# Patient Record
Sex: Female | Born: 1970
Health system: Southern US, Community
[De-identification: ages and names within clinical notes are randomized; demographics above are authoritative.]

## PROBLEM LIST (undated history)

## (undated) DIAGNOSIS — N920 Excessive and frequent menstruation with regular cycle: Secondary | ICD-10-CM

## (undated) DIAGNOSIS — M5430 Sciatica, unspecified side: Secondary | ICD-10-CM

## (undated) DIAGNOSIS — E119 Type 2 diabetes mellitus without complications: Secondary | ICD-10-CM

## (undated) DIAGNOSIS — I890 Lymphedema, not elsewhere classified: Secondary | ICD-10-CM

## (undated) DIAGNOSIS — I10 Essential (primary) hypertension: Secondary | ICD-10-CM

## (undated) DIAGNOSIS — I509 Heart failure, unspecified: Secondary | ICD-10-CM

## (undated) HISTORY — DX: Type 2 diabetes mellitus without complications: E11.9

## (undated) HISTORY — PX: OTHER SURGICAL HISTORY: SHX169

## (undated) HISTORY — PX: TUBAL LIGATION: SHX77

---

## 2018-10-12 ENCOUNTER — Emergency Department (HOSPITAL_COMMUNITY): Payer: Self-pay

## 2018-10-12 ENCOUNTER — Observation Stay (HOSPITAL_COMMUNITY)
Admission: EM | Admit: 2018-10-12 | Discharge: 2018-10-14 | Disposition: A | Discharge status: Home / Self Care | Payer: Self-pay | Attending: Internal Medicine | Admitting: Internal Medicine

## 2018-10-12 DIAGNOSIS — R0600 Dyspnea, unspecified: Secondary | ICD-10-CM | POA: Insufficient documentation

## 2018-10-12 DIAGNOSIS — D500 Iron deficiency anemia secondary to blood loss (chronic): Secondary | ICD-10-CM | POA: Insufficient documentation

## 2018-10-12 DIAGNOSIS — J811 Chronic pulmonary edema: Secondary | ICD-10-CM | POA: Insufficient documentation

## 2018-10-12 DIAGNOSIS — D509 Iron deficiency anemia, unspecified: Secondary | ICD-10-CM | POA: Diagnosis present

## 2018-10-12 DIAGNOSIS — I509 Heart failure, unspecified: Secondary | ICD-10-CM | POA: Insufficient documentation

## 2018-10-12 DIAGNOSIS — J45909 Unspecified asthma, uncomplicated: Secondary | ICD-10-CM | POA: Insufficient documentation

## 2018-10-12 DIAGNOSIS — I517 Cardiomegaly: Secondary | ICD-10-CM | POA: Insufficient documentation

## 2018-10-12 DIAGNOSIS — R0789 Other chest pain: Principal | ICD-10-CM | POA: Diagnosis present

## 2018-10-12 DIAGNOSIS — N920 Excessive and frequent menstruation with regular cycle: Secondary | ICD-10-CM | POA: Diagnosis present

## 2018-10-12 DIAGNOSIS — E872 Acidosis, unspecified: Secondary | ICD-10-CM | POA: Diagnosis present

## 2018-10-12 DIAGNOSIS — D649 Anemia, unspecified: Secondary | ICD-10-CM | POA: Diagnosis present

## 2018-10-12 HISTORY — DX: Excessive and frequent menstruation with regular cycle: N92.0

## 2018-10-12 HISTORY — DX: Morbid (severe) obesity due to excess calories: E66.01

## 2018-10-12 LAB — I-STAT BETA HCG BLOOD, ED (MC, WL, AP ONLY): I-stat hCG, quantitative: <5 m[IU]/mL (ref <5)

## 2018-10-12 LAB — I-STAT TROPONIN, ED: Troponin i, poc: 0 ng/mL (ref 0.00–0.08)

## 2018-10-12 LAB — D-DIMER, QUANTITATIVE: D-Dimer, Quant: 0.29 ug/mL-FEU (ref 0.00–0.50)

## 2018-10-12 MED ORDER — SODIUM CHLORIDE 0.9 % IV BOLUS (SEPSIS)
1000.0000 mL | Freq: Once | INTRAVENOUS | Status: AC
  Administered 2018-10-13: 1000 mL via INTRAVENOUS

## 2018-10-12 NOTE — ED Provider Notes (Signed)
TIME SEEN: 11:00 PM  CHIEF COMPLAINT: Right-sided chest pain  HPI: Patient is a 48 year old female with history of obesity, asthma, anemia who presents to the emergency department with right-sided chest pain under her right breast that started today.  States it is better when she is lying flat.  Given aspirin and nitroglycerin with EMS and now she is pain-free.  Describes it as a sharp pain.  Has felt short of breath but states this is chronic for the past 2 years.  Has had lower extremity swelling that is also chronic and unchanged.  Denies any fever.  Has had dry cough.  No vomiting or diarrhea.  No abdominal pain.  States she has had a history of "fluid on my heart".  ROS: See HPI Constitutional: no fever  Eyes: no drainage  ENT: no runny nose   Cardiovascular:   chest pain  Resp: Chronic SOB  GI: no vomiting GU: no dysuria Integumentary: no rash  Allergy: no hives  Musculoskeletal: no leg swelling  Neurological: no slurred speech ROS otherwise negative  PAST MEDICAL HISTORY/PAST SURGICAL HISTORY:  No past medical history on file.  MEDICATIONS:  Prior to Admission medications   Not on File    ALLERGIES:  Allergies not on file  SOCIAL HISTORY:  Social History   Tobacco Use  . Smoking status: Not on file  Substance Use Topics  . Alcohol use: Not on file    FAMILY HISTORY: No family history on file.  EXAM: BP (!) 106/44 (BP Location: Right Arm)   Pulse (!) 123   Temp 99.3 F (37.4 C) (Oral)   Resp 11   SpO2 100%  CONSTITUTIONAL: Alert and oriented and responds appropriately to questions. Well-appearing; well-nourished, obese HEAD: Normocephalic EYES: Conjunctivae clear, pupils appear equal, EOMI ENT: normal nose; moist mucous membranes NECK: Supple, no meningismus, no nuchal rigidity, no LAD  CARD: Regular and tachycardic; S1 and S2 appreciated; no murmurs, no clicks, no rubs, no gallops CHEST:  Chest wall is nontender to palpation.  No crepitus, ecchymosis,  erythema, warmth, rash or other lesions present.   RESP: Normal chest excursion without splinting or tachypnea; breath sounds clear and equal bilaterally; no wheezes, no rhonchi, no rales, no hypoxia or respiratory distress, speaking full sentences ABD/GI: Normal bowel sounds; non-distended; soft, non-tender, no rebound, no guarding, no peritoneal signs, no hepatosplenomegaly BACK:  The back appears normal and is non-tender to palpation, there is no CVA tenderness EXT: Normal ROM in all joints; non-tender to palpation; no edema; normal capillary refill; no cyanosis, no calf tenderness or swelling    SKIN: Normal color for age and race; warm; no rash NEURO: Moves all extremities equally PSYCH: The patient's mood and manner are appropriate. Grooming and personal hygiene are appropriate.  MEDICAL DECISION MAKING: Patient here with atypical right-sided chest pain.  Low suspicion for ACS currently.  Her EKG shows a sinus tachycardia.  She reports she has had a history of "fluid on her heart but is unable to tell me if this was a pleural effusion, pericardial effusion, pulmonary edema.  She denies any known history of CHF.  States she recently moved here from [facility] and all of her previous care was at [facility].  She has never had a pericardial window.  She has had a low-grade oral temperature here of 99.3.  Denies any fevers or chills, cough, sore throat, ear pain, body aches.  Did not have a flu shot this year.  Will check rectal temperature,  obtain blood cultures, lactate.  This could be pneumonia causing sepsis as she does have tachycardia and a systolic blood pressure in the 90s.  Will give IV fluids.  Also concern for possible pulmonary embolus.  She denies history of PE or DVT.  No calf tenderness or swelling on exam.  Will obtain d-dimer.  Symptomatic anemia could also be causing patient's symptoms today.  Has had history of previous transfusions.  Reports this is from  history of heavy menstrual cycles.  Her last menstrual period was January 10.  No vaginal bleeding currently, melena, bloody stools.  ED PROGRESS: Rectal temperature 99.  No leukocytosis.  Less likely sepsis.  Lactate mildly elevated.  She is receiving IV fluids.  Blood pressure and heart rate are improving.  Troponin negative.  D-dimer negative.  BNP normal.  Chest x-ray shows vascular congestion with pulmonary edema.  She has no hypoxia or change in her chronic shortness of breath.  Will hold on diuresis at this time given she has had some soft blood pressures.  Her hemoglobin today is 7.3.  This could be the cause of her symptoms is her symptomatic anemia.  We will transfuse 2 units of packed red blood cells.  Patient will need admission.  No local primary care provider.  1:57 AM Discussed patient's case with hospitalist, Dr. Clyde Lundborg.  I have recommended admission and patient (and family if present) agree with this plan. Admitting physician will place admission orders.   I reviewed all nursing notes, vitals, pertinent previous records, EKGs, lab and urine results, imaging (as available).     EKG Interpretation  Date/Time:  Friday October 12 2018 22:06:44 EST Ventricular Rate:  118 PR Interval:    QRS Duration: 82 QT Interval:  310 QTC Calculation: 435 R Axis:   38 Text Interpretation:  Sinus tachycardia Low voltage, precordial leads Nonspecific T abnormalities, lateral leads No old tracing to compare Confirmed by Ward, Baxter Hire [phone]) on 10/12/2018 11:00:32 PM        CRITICAL CARE Performed by: Baxter Hire Ward   Total critical care time: 65 minutes  Critical care time was exclusive of separately billable procedures and treating other patients.  Critical care was necessary to treat or prevent imminent or life-threatening deterioration.  Critical care was time spent personally by me on the following activities: development of treatment plan with patient and/or surrogate as well as  nursing, discussions with consultants, evaluation of patient's response to treatment, examination of patient, obtaining history from patient or surrogate, ordering and performing treatments and interventions, ordering and review of laboratory studies, ordering and review of radiographic studies, pulse oximetry and re-evaluation of patient's condition.     Ward, Layla Maw, DO 10/13/18 [phone]

## 2018-10-12 NOTE — ED Triage Notes (Signed)
Pt brought in by [facility] from home for sudden onset of nonradiating right sided chest pain at 2100, pt states pain worsens with movement. Pt states she has a hx of "fluid on the heart" in 2018. Pt given 324mg  aspirin and x2 nitro PTA, pain went from a 7/10 to 4/10.

## 2018-10-13 ENCOUNTER — Encounter (HOSPITAL_COMMUNITY): Payer: Self-pay | Admitting: Internal Medicine

## 2018-10-13 ENCOUNTER — Observation Stay (HOSPITAL_BASED_OUTPATIENT_CLINIC_OR_DEPARTMENT_OTHER): Payer: Self-pay

## 2018-10-13 ENCOUNTER — Other Ambulatory Visit: Payer: Self-pay

## 2018-10-13 DIAGNOSIS — R079 Chest pain, unspecified: Secondary | ICD-10-CM

## 2018-10-13 DIAGNOSIS — R0602 Shortness of breath: Secondary | ICD-10-CM

## 2018-10-13 DIAGNOSIS — R0789 Other chest pain: Secondary | ICD-10-CM

## 2018-10-13 DIAGNOSIS — D509 Iron deficiency anemia, unspecified: Secondary | ICD-10-CM | POA: Diagnosis present

## 2018-10-13 DIAGNOSIS — E872 Acidosis, unspecified: Secondary | ICD-10-CM | POA: Diagnosis present

## 2018-10-13 DIAGNOSIS — J810 Acute pulmonary edema: Secondary | ICD-10-CM

## 2018-10-13 DIAGNOSIS — R9389 Abnormal findings on diagnostic imaging of other specified body structures: Secondary | ICD-10-CM

## 2018-10-13 DIAGNOSIS — D649 Anemia, unspecified: Secondary | ICD-10-CM | POA: Diagnosis present

## 2018-10-13 DIAGNOSIS — N920 Excessive and frequent menstruation with regular cycle: Secondary | ICD-10-CM | POA: Diagnosis present

## 2018-10-13 LAB — RETICULOCYTES
Immature Retic Fract: 22.4 % — ABNORMAL HIGH (ref 2.3–15.9)
RBC.: 4.35 MIL/uL (ref 3.87–5.11)
Retic Count, Absolute: 73.1 10*3/uL (ref 19.0–186.0)
Retic Ct Pct: 1.7 % (ref 0.4–3.1)

## 2018-10-13 LAB — CBC WITH DIFFERENTIAL/PLATELET
Abs Immature Granulocytes: 0.02 10*3/uL (ref 0.00–0.07)
Basophils Absolute: 0.1 10*3/uL (ref 0.0–0.1)
Basophils Relative: 1 %
Eosinophils Absolute: 0.1 10*3/uL (ref 0.0–0.5)
Eosinophils Relative: 1 %
HCT: 30.3 % — ABNORMAL LOW (ref 36.0–46.0)
Hemoglobin: 7.3 g/dL — ABNORMAL LOW (ref 12.0–15.0)
Immature Granulocytes: 0 %
LYMPHS ABS: 2.3 10*3/uL (ref 0.7–4.0)
Lymphocytes Relative: 30 %
MCH: 16.7 pg — ABNORMAL LOW (ref 26.0–34.0)
MCHC: 24.1 g/dL — ABNORMAL LOW (ref 30.0–36.0)
MCV: 69.2 fL — AB (ref 80.0–100.0)
MONOS PCT: 6 %
Monocytes Absolute: 0.5 10*3/uL (ref 0.1–1.0)
Neutro Abs: 4.7 10*3/uL (ref 1.7–7.7)
Neutrophils Relative %: 62 %
Platelets: 338 10*3/uL (ref 150–400)
RBC: 4.38 MIL/uL (ref 3.87–5.11)
RDW: 19 % — ABNORMAL HIGH (ref 11.5–15.5)
WBC: 7.5 10*3/uL (ref 4.0–10.5)
nRBC: 0 % (ref 0.0–0.2)

## 2018-10-13 LAB — COMPREHENSIVE METABOLIC PANEL
ALT: 11 U/L (ref 0–44)
AST: 22 U/L (ref 15–41)
Albumin: 2.7 g/dL — ABNORMAL LOW (ref 3.5–5.0)
Alkaline Phosphatase: 82 U/L (ref 38–126)
Anion gap: 9 (ref 5–15)
BUN: 5 mg/dL — ABNORMAL LOW (ref 6–20)
CO2: 20 mmol/L — ABNORMAL LOW (ref 22–32)
CREATININE: 0.56 mg/dL (ref 0.44–1.00)
Calcium: 9.5 mg/dL (ref 8.9–10.3)
Chloride: 106 mmol/L (ref 98–111)
GFR calc Af Amer: >60 mL/min (ref >60)
GFR calc non Af Amer: >60 mL/min (ref >60)
Glucose, Bld: 139 mg/dL — ABNORMAL HIGH (ref 70–99)
Potassium: 3.8 mmol/L (ref 3.5–5.1)
Sodium: 135 mmol/L (ref 135–145)
Total Bilirubin: <0.1 mg/dL — ABNORMAL LOW (ref 0.3–1.2)
Total Protein: 8.4 g/dL — ABNORMAL HIGH (ref 6.5–8.1)

## 2018-10-13 LAB — IRON AND TIBC
Iron: 11 ug/dL — ABNORMAL LOW (ref 28–170)
Saturation Ratios: 2 % — ABNORMAL LOW (ref 10.4–31.8)
TIBC: 444 ug/dL (ref 250–450)
UIBC: 433 ug/dL

## 2018-10-13 LAB — URINALYSIS, ROUTINE W REFLEX MICROSCOPIC
Bilirubin Urine: NEGATIVE
Glucose, UA: NEGATIVE mg/dL
Hgb urine dipstick: NEGATIVE
Ketones, ur: NEGATIVE mg/dL
LEUKOCYTES UA: NEGATIVE
Nitrite: NEGATIVE
PROTEIN: NEGATIVE mg/dL
Specific Gravity, Urine: 1.021 (ref 1.005–1.030)
pH: 6 (ref 5.0–8.0)

## 2018-10-13 LAB — CBC
HCT: 31.3 % — ABNORMAL LOW (ref 36.0–46.0)
Hemoglobin: 8.1 g/dL — ABNORMAL LOW (ref 12.0–15.0)
MCH: 18.4 pg — ABNORMAL LOW (ref 26.0–34.0)
MCHC: 25.9 g/dL — ABNORMAL LOW (ref 30.0–36.0)
MCV: 71.1 fL — ABNORMAL LOW (ref 80.0–100.0)
Platelets: 299 10*3/uL (ref 150–400)
RBC: 4.4 MIL/uL (ref 3.87–5.11)
RDW: 21.1 % — ABNORMAL HIGH (ref 11.5–15.5)
WBC: 7.1 10*3/uL (ref 4.0–10.5)
nRBC: 0 % (ref 0.0–0.2)

## 2018-10-13 LAB — ECHOCARDIOGRAM COMPLETE: Height: 67 in

## 2018-10-13 LAB — FERRITIN: FERRITIN: 4 ng/mL — AB (ref 11–307)

## 2018-10-13 LAB — LIPID PANEL
Cholesterol: 133 mg/dL (ref 0–200)
HDL: 26 mg/dL — ABNORMAL LOW (ref >40)
LDL CALC: 88 mg/dL (ref 0–99)
Total CHOL/HDL Ratio: 5.1 RATIO
Triglycerides: 94 mg/dL (ref <150)
VLDL: 19 mg/dL (ref 0–40)

## 2018-10-13 LAB — TROPONIN I
Troponin I: <0.03 ng/mL (ref <0.03)
Troponin I: <0.03 ng/mL (ref <0.03)
Troponin I: <0.03 ng/mL (ref <0.03)

## 2018-10-13 LAB — RAPID URINE DRUG SCREEN, HOSP PERFORMED
Amphetamines: NOT DETECTED
BARBITURATES: NOT DETECTED
BENZODIAZEPINES: NOT DETECTED
Cocaine: NOT DETECTED
Opiates: NOT DETECTED
Tetrahydrocannabinol: NOT DETECTED

## 2018-10-13 LAB — VITAMIN B12: Vitamin B-12: 276 pg/mL (ref 180–914)

## 2018-10-13 LAB — LACTIC ACID, PLASMA
Lactic Acid, Venous: 2.6 mmol/L (ref 0.5–1.9)
Lactic Acid, Venous: 1.1 mmol/L (ref 0.5–1.9)
Lactic Acid, Venous: 1.7 mmol/L (ref 0.5–1.9)

## 2018-10-13 LAB — APTT: aPTT: 29 seconds (ref 24–36)

## 2018-10-13 LAB — PROCALCITONIN: Procalcitonin: 0.1 ng/mL

## 2018-10-13 LAB — ABO/RH: ABO/RH(D): O POS

## 2018-10-13 LAB — HEMOGLOBIN A1C
HEMOGLOBIN A1C: 5.5 % (ref 4.8–5.6)
Mean Plasma Glucose: 111.15 mg/dL

## 2018-10-13 LAB — HIV ANTIBODY (ROUTINE TESTING W REFLEX): HIV Screen 4th Generation wRfx: NONREACTIVE

## 2018-10-13 LAB — PROTIME-INR
INR: 1.06
Prothrombin Time: 13.7 seconds (ref 11.4–15.2)

## 2018-10-13 LAB — PREPARE RBC (CROSSMATCH)

## 2018-10-13 LAB — FOLATE: Folate: 10.2 ng/mL (ref >5.9)

## 2018-10-13 LAB — BRAIN NATRIURETIC PEPTIDE: B Natriuretic Peptide: 33.6 pg/mL (ref 0.0–100.0)

## 2018-10-13 LAB — OCCULT BLOOD X 1 CARD TO LAB, STOOL: Fecal Occult Bld: NEGATIVE

## 2018-10-13 MED ORDER — NITROGLYCERIN 0.4 MG SL SUBL: 0.4000 mg | SUBLINGUAL_TABLET | SUBLINGUAL | Status: DC | PRN

## 2018-10-13 MED ORDER — FERROUS SULFATE 325 (65 FE) MG PO TABS
325.0000 mg | ORAL_TABLET | Freq: Two times a day (BID) | ORAL | Status: DC
  Administered 2018-10-13 – 2018-10-14 (×3): 325 mg via ORAL
  Filled 2018-10-13 (×3): qty 1

## 2018-10-13 MED ORDER — ACETAMINOPHEN 325 MG PO TABS
650.0000 mg | ORAL_TABLET | ORAL | Status: DC | PRN
  Administered 2018-10-13: 650 mg via ORAL
  Filled 2018-10-13: qty 2

## 2018-10-13 MED ORDER — ASPIRIN EC 325 MG PO TBEC
325.0000 mg | DELAYED_RELEASE_TABLET | Freq: Every day | ORAL | Status: DC
  Administered 2018-10-13 – 2018-10-14 (×2): 325 mg via ORAL
  Filled 2018-10-13 (×2): qty 1

## 2018-10-13 MED ORDER — ONDANSETRON HCL 4 MG/2ML IJ SOLN: 4.0000 mg | Freq: Four times a day (QID) | INTRAMUSCULAR | Status: DC | PRN

## 2018-10-13 MED ORDER — FUROSEMIDE 10 MG/ML IJ SOLN
40.0000 mg | Freq: Once | INTRAMUSCULAR | Status: AC
  Administered 2018-10-13: 40 mg via INTRAVENOUS
  Filled 2018-10-13: qty 4

## 2018-10-13 MED ORDER — ENOXAPARIN SODIUM 40 MG/0.4ML ~~LOC~~ SOLN
40.0000 mg | Freq: Every day | SUBCUTANEOUS | Status: DC
  Filled 2018-10-13: qty 0.4

## 2018-10-13 MED ORDER — MORPHINE SULFATE (PF) 2 MG/ML IV SOLN: 2.0000 mg | INTRAVENOUS | Status: DC | PRN

## 2018-10-13 MED ORDER — SODIUM CHLORIDE 0.9 % IV SOLN
10.0000 mL/h | Freq: Once | INTRAVENOUS | Status: AC
  Administered 2018-10-13: 10 mL/h via INTRAVENOUS

## 2018-10-13 NOTE — Progress Notes (Signed)
Called echo, technician stated pt is next for transport for 2D echo  Pt aware

## 2018-10-13 NOTE — Progress Notes (Signed)
Lab aware of needing h/h at 1100, 2hour post infusion per protocol

## 2018-10-13 NOTE — Progress Notes (Addendum)
Patient was admitted after midnight and agree with overall assessment and plan.  She reports having right-sided chest pains with shortness of breath on exertion for which she is unable to ambulate.  Previously reports being on a medication to help with heavy menstrual periods but she has not been on this medication in over 2 years.  Patient was given 2 units of blood, but hemoglobin noted to only go from 7.3 --> 8.1.  Lactic acidosis resolved without antibiotics and patient remains afebrile.  Iron levels low which have started patient on ferrous sulfate.  Cardiology evaluated patient and thought chest pain symptoms atypical in nature, troponins were negative, and did not recommend any further cardiac work-up.  Chest x-ray showing mild congestive heart failure with prominence of the pulmonary artery, BNP 33.6, and d-dimer was negative.  She does have at least 1+ pitting lower extremity edema bilaterally.  Give Lasix 40 mg IV for suspected CHF related with anemia.  Physical therapy to eval and treat for debility.  See full H&P for complete details.

## 2018-10-13 NOTE — Progress Notes (Signed)
Called lab regarding troponin draw  Stated they will come to bedside and draw now

## 2018-10-13 NOTE — H&P (Signed)
History and Physical    Kaylee [name] ZOX:[id number] DOB: 26-Mar-1971 DOA: 10/12/2018  Referring MD/NP/PA:   PCP: Patient, No Pcp Per   Patient coming from:  The patient is coming from home.  At baseline, pt is independent for most of ADL.        Chief Complaint: chest pain  HPI: Kaylee [name] is a 48 y.o. female with medical history significant of morbid obesity, heavy menstrual, possible cardiomyopathy (patient states that she has enlarged heart), who presents with chest pain.  Patient states that her chest pain started suddenly at about 9 PM.  It is located in the substernal area, 7 out of 10 in severity initially, currently 4 out of 10 severity, dull, radiating to the right arm, aggravated by exertion.  Patient also has shortness breath and dry cough.  No recent long traveling.  No fever or chills.  She does not have nausea, vomiting, diarrhea, abdominal pain, symptoms of UTI.  Denies any rectal bleeding or dark stool.  No hematuria. She states she has regular, but heavy menstrual.,  The last period was January 10 currently not..  ED Course: pt was found to have negative troponin, BNP 36, WBC 7.5, hemoglobin 7.3 (no baseline hemoglobin), lactic acid 2.6, negative d-dimer, negative pregnancy test, electrolytes renal function okay, temperature 99.3, tachycardia, oxygen saturation 100% on room air.  Chest x-ray showed CHF pattern with possible pulmonary hypertension.  Patient is placed on telemetry bed for observation.  Review of Systems:   General: no fevers, chills, no body weight gain, has fatigue HEENT: no blurry vision, hearing changes or sore throat Respiratory: has dyspnea, coughing, no wheezing CV: no chest pain, no palpitations GI: no nausea, vomiting, abdominal pain, diarrhea, constipation GU: no dysuria, burning on urination, increased urinary frequency, hematuria  Ext: no leg edema Neuro: no unilateral weakness, numbness, or tingling, no vision change or hearing loss Skin: no  rash, no skin tear. MSK: No muscle spasm, no deformity, no limitation of range of movement in spin Heme: No easy bruising.  Travel history: No recent long distant travel.  Allergy: No Known Allergies  Past Medical History:  Diagnosis Date  . Heavy menstrual period   . Morbid obesity (HCC)     Past Surgical History:  Procedure Laterality Date  . s/p of leep cervix      Social History:  reports that she has never smoked. She has never used smokeless tobacco. No history on file for alcohol and drug.  Family History:  Family History  Problem Relation Age of Onset  . Leukemia Mother      Prior to Admission medications   Not on File    Physical Exam: Vitals:   10/13/18 0305 10/13/18 0331 10/13/18 0600 10/13/18 0626  BP: 107/66 110/68 119/77 116/73  Pulse: (!) 101 78 89 92  Resp: 13 13 14 13   Temp: 98.7 F (37.1 C) 98.9 F (37.2 C) 98.8 F (37.1 C) 98.7 F (37.1 C)  TempSrc: Oral Oral Oral Oral  SpO2: 98% 97% 98% 99%  Height: 5\' 7"  (1.702 m)      General: Not in acute distress. Pale looking.  Morbid obesity HEENT:       Eyes: PERRL, EOMI, no scleral icterus.       ENT: No discharge from the ears and nose, no pharynx injection, no tonsillar enlargement.        Neck: No JVD, no bruit, no mass felt. Heme: No neck lymph node enlargement. Cardiac: S1/S2, RRR, No murmurs, No  gallops or rubs. Respiratory:  No rales, wheezing, rhonchi or rubs. GI: Soft, nondistended, nontender, no rebound pain, no organomegaly, BS present. GU: No hematuria Ext: No pitting leg edema bilaterally. 2+DP/PT pulse bilaterally. Musculoskeletal: No joint deformities, No joint redness or warmth, no limitation of ROM in spin. Skin: No rashes.  Neuro: Alert, oriented X3, cranial nerves II-XII grossly intact, moves all extremities normally.  Psych: Patient is not psychotic, no suicidal or hemocidal ideation.  Labs on Admission: I have personally reviewed following labs and imaging  studies  CBC: Recent Labs  Lab 10/12/18 2320  WBC 7.5  NEUTROABS 4.7  HGB 7.3*  HCT 30.3*  MCV 69.2*  PLT 338   Basic Metabolic Panel: Recent Labs  Lab 10/12/18 2320  NA 135  K 3.8  CL 106  CO2 20*  GLUCOSE 139*  BUN 5*  CREATININE 0.56  CALCIUM 9.5   GFR: CrCl cannot be calculated (Unknown ideal weight.). Liver Function Tests: Recent Labs  Lab 10/12/18 2320  AST 22  ALT 11  ALKPHOS 82  BILITOT <0.1*  PROT 8.4*  ALBUMIN 2.7*   No results for input(s): LIPASE, AMYLASE in the last 168 hours. No results for input(s): AMMONIA in the last 168 hours. Coagulation Profile: Recent Labs  Lab 10/13/18 0311  INR 1.06   Cardiac Enzymes: Recent Labs  Lab 10/13/18 0311  TROPONINI <0.03   BNP (last 3 results) No results for input(s): PROBNP in the last 8760 hours. HbA1C: No results for input(s): HGBA1C in the last 72 hours. CBG: No results for input(s): GLUCAP in the last 168 hours. Lipid Profile: Recent Labs    10/13/18 0311  CHOL 133  HDL 26*  LDLCALC 88  TRIG 94  CHOLHDL 5.1   Thyroid Function Tests: No results for input(s): TSH, T4TOTAL, FREET4, T3FREE, THYROIDAB in the last 72 hours. Anemia Panel: Recent Labs    10/13/18 0118  VITAMINB12 276  FOLATE 10.2  FERRITIN 4*  TIBC 444  IRON 11*  RETICCTPCT 1.7   Urine analysis: No results found for: COLORURINE, APPEARANCEUR, LABSPEC, PHURINE, GLUCOSEU, HGBUR, BILIRUBINUR, KETONESUR, PROTEINUR, UROBILINOGEN, NITRITE, LEUKOCYTESUR Sepsis Labs: @LABRCNTIP (procalcitonin:4,lacticidven:4) )No results found for this or any previous visit (from the past 240 hour(s)).   Radiological Exams on Admission: Dg Chest 2 View  Result Date: 10/13/2018 CLINICAL DATA:  Right chest pain, fever EXAM: CHEST - 2 VIEW COMPARISON:  None. FINDINGS: Mild cardiomegaly. Prominent main pulmonary artery contour. Otherwise normal mediastinal contour. No pneumothorax. No pleural effusion. Mild pulmonary edema. No acute  consolidative airspace disease. IMPRESSION: Mild congestive heart failure. Prominence of the main pulmonary artery contour suggests pulmonary arterial hypertension. Electronically Signed   By: Delbert PhenixJason A Poff M.D.   On: 10/13/2018 00:02     EKG: Independently reviewed.  Sinus rhythm with tachycardia, QTC 435, low voltage, nonspecific T wave change.  Assessment/Plan Principal Problem:   Chest pain Active Problems:   Microcytic anemia   Symptomatic anemia   Heavy menstrual period   Lactic acidosis   Chest pain: Likely due to demand ischemia secondary to symptomatic anemia.  Initial troponin negative.  D-dimer negative. - Placed on telemetry bed for observation -Transfuse 2 unit of blood - cycle CE q6 x3 and repeat EKG in the am  - prn Nitroglycerin, Morphine, and aspirin - Risk factor stratification: will check FLP and A1C , UDS - 2d echo - check BNP   Microcytic anemia and symptomatic anemia: Likely due to heavy menstrual.. -2 U of blood were ordered by EDP. -  check FOBT and anemia panel.  Heavy menstrual period: -f/u with Ob/gyn  Lactic acidosis: Lactic acid 2.6.  No fever or leukocytosis.  Clinically does not seem to have sepsis.  Possibly due to dehydration and anemia. -1 L normal saline was given -Patient will be transfused with 2 unit of blood. -repeat lactic acid in AM - Bx     DVT ppx: SQ Lovenox Code Status: Full code Family Communication:  Daughter is at bedside Disposition Plan:  Anticipate discharge back to previous home environment Consults called:  none Admission status: Obs / tele    Date of Service 10/13/2018    Lorretta Harp [facility]   If 7PM-7AM, please contact night-coverage [email or web address] Password [facility] 10/13/2018, 7:17 AM

## 2018-10-13 NOTE — Progress Notes (Signed)
  Echocardiogram 2D Echocardiogram has been performed.  Kaylee [name] 10/13/2018, 3:48 PM

## 2018-10-13 NOTE — ED Notes (Addendum)
RN informed Lactic acid is 2.6. RN informed MD.

## 2018-10-13 NOTE — Progress Notes (Signed)
Educated pt on needing a stool sample  Pt aware Placed collection hat in toilet  Will continue to monitor

## 2018-10-13 NOTE — Consult Note (Addendum)
Cardiology Consultation:   Patient ID: Kaylee [name] MRN: [id number]; DOB: 22-Jul-1971  Admit date: 10/12/2018 Date of Consult: 10/13/2018  Primary Care Provider: Patient, No Pcp Per Primary Cardiologist: New to [facility] Primary Electrophysiologist:  None    Patient Profile:   Kaylee [name] is a 48 y.o. African-American female with poor access to healthcare, hx of morbid obesity, asthma, menorrhagia, anemia and self-reported history of cervical cancer and cardiomegaly who is being seen today for the evaluation of chest pain at the request of Dr. Marcelyn Bruins, internal medicine.  History of Present Illness:    Kaylee [name] is a 48 y.o. African-American female with poor access to healthcare, hx of morbid obesity, asthma, menorrhagia, anemia and self-reported history of cervical cancer and cardiomegaly who is being seen today for the evaluation of chest pain at the request of Dr. Marcelyn Bruins, internal medicine.   Patient reports that she recently moved to the [place] area from [facility] 1 year ago.  She has been without insurance thus has had poor access to healthcare.  She reports a prior history of anemia secondary to menorrhagia.  2 years ago she was treated at a hospital in [facility] for anemia with hemoglobin level down into the 3 range and required blood transfusion.  She reports that she was being followed by a gynecologist around that time and was told that she had cervical cancer.  It was recommended that she have a hysterectomy however the patient reports that she was not able to get this done due to lack of insurance.  She was also previously on iron supplements for her anemia but has not been compliant.  She has not seen a gynecologist in over 2 years.  She also reports that when she was hospitalized 2 years ago she was told that she had an enlarged heart however did not undergo any extensive cardiac work-up.  She denies any significant cardiac risk  factors other than obesity.  There is no known history of diabetes, hyperlipidemia nor hypertension.  She denies history of tobacco use.  No family history of cardiac disease.  Family history is notable for leukemia in mother.  She reports that over the last year she has been troubled by exertional dyspnea.  This has worsened over the last several weeks.  She is short of breath ambulating around her house and has to stop and rest.  She notes decreased exercise tolerance.  Yesterday she developed new onset chest discomfort.  She was sitting on the side of her bed talking to her grandchildren.  She reports the pain came out of nowhere.  It was described as sharp pain underneath her right breast radiating to her right shoulder.  For she felt that it was indigestion however it was not relieved with belching.  It was also worsened with physical movement.  The pain was intense, 7 out of 10.  She called EMS.  On their arrival they gave her sublingual nitroglycerin which resulted in symptomatic improvement, bringing the pain down from a 7 out of 10 to a 4 out of 10.  She received a second sublingual nitroglycerin which helped to completely resolve the pain.  She was brought into the ED for evaluation.  Initial troponin negative.  Chest x-ray showed mild congestive heart failure and prominence of the main pulmonary artery contour suggestive of pulmonary arterial hypertension. BNP WNL at 33.6 (may be falsely low due to obesity). D-dimer negative. EKG shows NSR. HR 92 bpm. No ischemic abnormalities. Lipid  panel shows LDL at 88 mg/dL. HDL low at 26. TG WNL at 94. Hgb A1c pending. CBC shows anemia w/ Hgb at 7.3. No baseline for comparison. MCV 69 c/w microcytic anemia. Anemia panel c/w IDA. Low iron and low Feratin. B12 WNL. FOBT pending.  She is currently getting a blood transfusion.  She is currently chest pain-free.  Past Medical History:  Diagnosis Date  . Heavy menstrual period   . Morbid obesity (HCC)     Past  Surgical History:  Procedure Laterality Date  . s/p of leep cervix         Inpatient Medications: Scheduled Meds: . aspirin EC  325 mg Oral Daily  . enoxaparin (LOVENOX) injection  40 mg Subcutaneous QHS   Continuous Infusions:  PRN Meds: acetaminophen, morphine injection, nitroGLYCERIN, ondansetron (ZOFRAN) IV  Allergies:   No Known Allergies  Social History:   Social History   Socioeconomic History  . Marital status: Married    Spouse name: Not on file  . Number of children: Not on file  . Years of education: Not on file  . Highest education level: Not on file  Occupational History  . Not on file  Social Needs  . Financial resource strain: Not on file  . Food insecurity:    Worry: Not on file    Inability: Not on file  . Transportation needs:    Medical: Not on file    Non-medical: Not on file  Tobacco Use  . Smoking status: Never Smoker  . Smokeless tobacco: Never Used  Substance and Sexual Activity  . Alcohol use: Not on file  . Drug use: Not on file  . Sexual activity: Not on file  Lifestyle  . Physical activity:    Days per week: Not on file    Minutes per session: Not on file  . Stress: Not on file  Relationships  . Social connections:    Talks on phone: Not on file    Gets together: Not on file    Attends religious service: Not on file    Active member of club or organization: Not on file    Attends meetings of clubs or organizations: Not on file    Relationship status: Not on file  . Intimate partner violence:    Fear of current or ex partner: Not on file    Emotionally abused: Not on file    Physically abused: Not on file    Forced sexual activity: Not on file  Other Topics Concern  . Not on file  Social History Narrative  . Not on file    Family History:   Family History  Problem Relation Age of Onset  . Leukemia Mother      ROS:  Please see the history of present illness.   All other ROS reviewed and negative.     Physical  Exam/Data:   Vitals:   10/13/18 0305 10/13/18 0331 10/13/18 0600 10/13/18 0626  BP: 107/66 110/68 119/77 116/73  Pulse: (!) 101 78 89 92  Resp: 13 13 14 13   Temp: 98.7 F (37.1 C) 98.9 F (37.2 C) 98.8 F (37.1 C) 98.7 F (37.1 C)  TempSrc: Oral Oral Oral Oral  SpO2: 98% 97% 98% 99%  Height: 5\' 7"  (1.702 m)       Intake/Output Summary (Last 24 hours) at 10/13/2018 0734 Last data filed at 10/13/2018 16100611 Gross per 24 hour  Intake 1341 ml  Output -  Net 1341 ml   No flowsheet  data found.   There is no height or weight on file to calculate BMI.  General: Super morbidly obese African-American female in no acute distress HEENT: normal Lymph: no adenopathy Neck: no JVD Endocrine:  No thryomegaly Vascular: No carotid bruits; FA pulses 2+ bilaterally without bruits  Cardiac:  normal S1, S2; RRR; no murmur  Lungs:  clear to auscultation bilaterally, no wheezing, rhonchi or rales  Abd: Obese soft, nontender, no hepatomegaly  Ext: Obese extremities, no pitting edema Musculoskeletal:  No deformities, BUE and BLE strength normal and equal Skin: warm and dry  Neuro:  CNs 2-12 intact, no focal abnormalities noted Psych:  Normal affect   EKG:  The EKG was personally reviewed and demonstrates:  NSR 92 bpm Telemetry:  Telemetry was personally reviewed and demonstrates: Normal sinus rhythm, mild sinus tachycardia 1 teens.  Relevant CV Studies: 2D Echo pending    Laboratory Data:  Chemistry Recent Labs  Lab 10/12/18 2320  NA 135  K 3.8  CL 106  CO2 20*  GLUCOSE 139*  BUN 5*  CREATININE 0.56  CALCIUM 9.5  GFRNONAA >60  GFRAA >60  ANIONGAP 9    Recent Labs  Lab 10/12/18 2320  PROT 8.4*  ALBUMIN 2.7*  AST 22  ALT 11  ALKPHOS 82  BILITOT <0.1*   Hematology Recent Labs  Lab 10/12/18 2320 10/13/18 0118  WBC 7.5  --   RBC 4.38 4.35  HGB 7.3*  --   HCT 30.3*  --   MCV 69.2*  --   MCH 16.7*  --   MCHC 24.1*  --   RDW 19.0*  --   PLT 338  --    Cardiac  Enzymes Recent Labs  Lab 10/13/18 0311  TROPONINI <0.03    Recent Labs  Lab 10/12/18 2329  TROPIPOC 0.00    BNP Recent Labs  Lab 10/12/18 2320  BNP 33.6    DDimer  Recent Labs  Lab 10/12/18 2320  DDIMER 0.29    Radiology/Studies:  Dg Chest 2 View  Result Date: 10/13/2018 CLINICAL DATA:  Right chest pain, fever EXAM: CHEST - 2 VIEW COMPARISON:  None. FINDINGS: Mild cardiomegaly. Prominent main pulmonary artery contour. Otherwise normal mediastinal contour. No pneumothorax. No pleural effusion. Mild pulmonary edema. No acute consolidative airspace disease. IMPRESSION: Mild congestive heart failure. Prominence of the main pulmonary artery contour suggests pulmonary arterial hypertension. Electronically Signed   By: Delbert Phenix M.D.   On: 10/13/2018 00:02    Assessment and Plan:   Aiysha Freiwald is a 48 y.o. African-American female with poor access to healthcare, hx of morbid obesity, asthma, menorrhagia, anemia and self-reported history of cervical cancer and cardiomegaly who is being seen today for the evaluation of chest pain at the request of Dr. Marcelyn Bruins, internal medicine.  1.  Chest pain: Chest pain atypical and likely secondary to symptomatic anemia with hemoglobin at 7.3.  Initial troponin is negative and chest x-ray shows no ischemic abnormalities.  Agree with blood transfusion.  Given her anemia and ongoing heavy vaginal bleeding, she is not a candidate for invasive cardiac catheterization thus would not pursue further ischemic work-up at this time.  We can follow-up with her in clinic and can reassess for cardiac symptoms once her anemia and gynecologic issues are treated  2.  Abnormal chest x-ray: Patient reports being told in the past that she had an enlarged heart.  Chest x-ray in the ED showed mild congestive heart failure and prominence of the main pulmonary artery  contour suggestive of pulmonary arterial hypertension.  Her BNP is within normal limits at 33 however  this may be falsely low due to her obesity.  It is difficult to assess volume given her body habitus/morbid obesity.  She does endorse lower extremity edema however her albumin level is low at 2.7, complicating clinical picture.  Her exertional dyspnea is likely secondary to her anemia.  If she continues to have exertional dyspnea post blood transfusion may consider dose of IV Lasix.  Echocardiogram has been ordered and is pending to assess overall cardiac function.   3.  Iron deficiency anemia: Secondary to menorrhagia.  Hemoglobin in the ED was 7.3.  No prior labs for baseline comparison. MCV 69 c/w microcytic anemia. Anemia panel c/w IDA. Low iron and low Feratin.  She is currently getting a blood transfusion.  Further management per internal medicine.  4.  Menorrhagia: Patient reports long history of this.  She recalls that 2 years ago she was told that she has possible cervical cancer and hysterectomy was recommended however the patient did not go through with this due to lack of insurance.  She has not seen a gynecologist in over 2 years.  She will need assistance with getting plugged in with a new provider here in [place].  [facility]  Will defer to primary team.    For questions or updates, please contact [facility] Please consult [email or web address] for contact info under     Signed, Robbie Lis, PA-C  10/13/2018 7:34 AM  The patient was seen and examined, and I agree with the history, physical exam, assessment and plan as documented above, with modifications as noted below. I have also personally reviewed all relevant documentation, old records, labs, and both radiographic and cardiovascular studies. I have also independently interpreted old and new ECG's.  Briefly, this is a 48 year old woman with morbid obesity, asthma, menorrhagia, and anemia whom we are asked to evaluate for chest pain.    She was markedly anemic with a hemoglobin  of 7.3 at the time of admission.  Labs reviewed and appears to be consistent with an iron deficiency anemia.    She has since been transfused and symptoms have essentially resolved.  She has some mild chest wall tenderness to palpation and does describe some dull aches in the center of her chest but these are mild compared to what she first experienced.  She also had shortness of breath which is also significantly improved since receiving a blood transfusion.    ECG showed sinus rhythm and is without ischemic abnormalities.    Troponins are normal.  BNP is also normal although there is can be falsely low due to morbid obesity.    An echocardiogram has been ordered and is pending.  No additional cardiac studies will be pursued given her transfusion dependent iron deficiency anemia.  We will arrange for outpatient follow-up.   Prentice Docker, MD, [facility]  10/13/2018 10:27 AM  [facility] will sign off.   Medication Recommendations:  None Other recommendations (labs, testing, etc):  None Follow up as an outpatient:  Will arrange for cardiology outpatient follow up.

## 2018-10-14 LAB — TYPE AND SCREEN
ABO/RH(D): O POS
Antibody Screen: NEGATIVE
Unit division: 0
Unit division: 0

## 2018-10-14 LAB — CBC WITH DIFFERENTIAL/PLATELET
Abs Immature Granulocytes: 0 10*3/uL (ref 0.00–0.07)
Basophils Absolute: 0.2 10*3/uL — ABNORMAL HIGH (ref 0.0–0.1)
Basophils Relative: 2 %
Eosinophils Absolute: 0.2 10*3/uL (ref 0.0–0.5)
Eosinophils Relative: 3 %
HCT: 33.1 % — ABNORMAL LOW (ref 36.0–46.0)
Hemoglobin: 8.6 g/dL — ABNORMAL LOW (ref 12.0–15.0)
Lymphocytes Relative: 30 %
Lymphs Abs: 2.3 10*3/uL (ref 0.7–4.0)
MCH: 18.5 pg — ABNORMAL LOW (ref 26.0–34.0)
MCHC: 26 g/dL — ABNORMAL LOW (ref 30.0–36.0)
MCV: 71 fL — ABNORMAL LOW (ref 80.0–100.0)
MONOS PCT: 7 %
Monocytes Absolute: 0.5 10*3/uL (ref 0.1–1.0)
Neutro Abs: 4.4 10*3/uL (ref 1.7–7.7)
Neutrophils Relative %: 58 %
Platelets: 293 10*3/uL (ref 150–400)
RBC: 4.66 MIL/uL (ref 3.87–5.11)
RDW: 21.5 % — AB (ref 11.5–15.5)
WBC: 7.5 10*3/uL (ref 4.0–10.5)
nRBC: 0 /100 WBC
nRBC: 0.3 % — ABNORMAL HIGH (ref 0.0–0.2)

## 2018-10-14 LAB — BPAM RBC
BLOOD PRODUCT EXPIRATION DATE: 202003092359
Blood Product Expiration Date: 202003092359
ISSUE DATE / TIME: 202002080600
ISSUE DATE / TIME: 202002080252
Unit Type and Rh: 5100
Unit Type and Rh: 5100

## 2018-10-14 MED ORDER — HYDROCHLOROTHIAZIDE 12.5 MG PO TABS: 12.5000 mg | ORAL_TABLET | Freq: Every day | ORAL | 0 refills | Status: DC

## 2018-10-14 MED ORDER — POTASSIUM CHLORIDE ER 10 MEQ PO TBCR: 10.0000 meq | EXTENDED_RELEASE_TABLET | Freq: Every day | ORAL | 0 refills | Status: DC

## 2018-10-14 MED ORDER — FERROUS SULFATE 325 (65 FE) MG PO TABS: 325.0000 mg | ORAL_TABLET | Freq: Two times a day (BID) | ORAL | 0 refills | Status: DC

## 2018-10-14 MED ORDER — DICLOFENAC SODIUM 1 % TD GEL
2.0000 g | Freq: Four times a day (QID) | TRANSDERMAL | Status: DC
  Filled 2018-10-14: qty 100

## 2018-10-14 MED ORDER — ALBUTEROL SULFATE HFA 108 (90 BASE) MCG/ACT IN AERS: 2.0000 | INHALATION_SPRAY | Freq: Four times a day (QID) | RESPIRATORY_TRACT | 0 refills | Status: DC | PRN

## 2018-10-14 NOTE — Progress Notes (Signed)
Discharge paper work completed at bedside with pt and pt daughter. Aditional education provided on heart healthy diet, sodium and fluid restriction, weighing self daily and monitoring for swelling. Pt and pt daughter verbalize understanding. Pt IV's discounted, catheters intact and temetry removed. Pt has all belongings including taxi pass. Pt ambulated off unit with staff.

## 2018-10-14 NOTE — Progress Notes (Signed)
Pt completing 6 min walk with PT

## 2018-10-14 NOTE — Progress Notes (Signed)
[name] CM aware of outpt PT referral, stated she will come to bedside shortly   Called SW requested taxi pass for pt transport   Pt and pt family aware and understanding

## 2018-10-14 NOTE — Discharge Summary (Signed)
Patient just recently moved from [facility] and reports most of her records were at [facility].                                                                                                                                                     Kaylee [name], is a 48 y.o. female  DOB 1970/11/13  MRN [id number].  Admission date:  10/12/2018  Admitting Physician  Lorretta Harp, MD  Discharge Date:  10/14/2018   Primary MD  Patient, No Pcp Per  Recommendations for primary care physician for things to follow:   Discharge Diagnosis    Principal Problem:   Atypical chest pain Active Problems:   Microcytic anemia   Symptomatic anemia   Heavy menstrual period   Lactic acidosis      Past Medical History:  Diagnosis Date  . Heavy menstrual period   . Morbid obesity (HCC)     Past Surgical History:  Procedure Laterality Date  . s/p of leep cervix         HPI  from the history and physical done on the day of admission:    Kaylee [name] is a 48 y.o. female with medical history significant of morbid obesity, heavy menstrual, possible cardiomyopathy (patient states that she has enlarged heart), who presents with chest pain.  Patient states that her chest pain started suddenly at about 9 PM.  It is located in the substernal area, 7 out of 10 in severity initially, currently 4 out of 10 severity, dull, radiating to the right arm, aggravated by exertion.  Patient also has shortness breath and dry cough.  No recent long traveling.  No fever or chills.  She does not have nausea, vomiting, diarrhea, abdominal pain, symptoms of UTI.  Denies any rectal bleeding or dark stool.  No hematuria. She states she has regular, but heavy menstrual.,  The last period was January 10 currently not..  ED Course: pt was found to have negative troponin, BNP 36, WBC 7.5, hemoglobin 7.3 (no baseline hemoglobin), lactic acid 2.6, negative d-dimer, negative pregnancy test, electrolytes renal  function okay, temperature 99.3, tachycardia, oxygen saturation 100% on room air.  Chest x-ray showed CHF pattern with possible pulmonary hypertension.  Patient is placed on telemetry bed for observation.     Hospital Course:   1.  Atypical chest pain: Resolving.  Patient reports having right-sided chest pain that was reproducible with palpation and arm movements.  He has been treated with full dose aspirin.  Troponins negative x3 and EKG without significant ischemic changes.  Echocardiogram noted EF of 60-65%. Cardiology evaluated and thought symptoms did not warrant further work-up.  Suspect likely costochondritis.  2.  Iron deficiency with symptomatic anemia: Acute.   On admission hemoglobin noted to be as low as 7.3.  Patient was transfused 2 units of  packed red blood cells.  Hemoglobin 8.1->8.6 prior to discharge.  She was noted to have low MCV and MCH likely related with menorrhagia.  Patient was started on ferrous sulfate.  3.  Menorrhagia: Patient reports last menstrual cycle on January 10 lasting 7 days.  She reports having significant clotting with menstrual periods.  Ambulatory referral sent for OB/GYN, but patient also given the number to the women's outpatient clinic to call and try and set up appointment.  4.  Cardiomegaly, diastolic dysfunction: Chronic.  She reports previously being told that she had enlarged heart.  Patient was discharged home on low-dosehydrochlorothiazide 12.5 mg daily and potassium chloride 10 mEq daily.    5.  Dyspnea, pulmonary edema, morbid obesity: Patient reports continued shortness of breath with exertion.  Chest x-ray did note some signs of mild heart failure.  Patient was given 40 mg of Lasix IV tonight prior to discharge with some improvement in breathing. A weight was not able to be obtained during hospital stay. Patient ambulated with physical therapy 350 feet with multiple breaks, but O2 saturations noted to be within normal limits.  Medications as seen  above.  In addition patient was prescribed albuterol inhaler.  Physical therapy recommending outpatient PT.  Care management to help to arrange.  Symptoms thought to be likely multifactorial in the setting of anemia and deconditioning.  6.  Lactic acidosis: Resolved.  Patient's initial lactic acid elevated up to 2.6, but resolved with administration of blood products.  Patient just recently moved from [facility] and is in need of a primary care provider.  She was given information to call the community health and wellness center to try and establish care.                   Follow UP  Follow-up Information    [facility]. Schedule an appointment as soon as possible for a visit.   Why:  Call and try to make appointment for 1-2 weeks Contact information: [address] [place] [address] [phone]       [facility]. Schedule an appointment as soon as possible for a visit.   Why:  call to try and make appointment referral sent  Contact information: [address] [place] [address] [phone]       [facility] Follow up.   Specialty:  Rehabilitation Why:  Please call to check into outpatient PT Contact information: [address] [id number] [place] [address] [phone]           Consults obtained: Cardiology  Discharge Condition: Stable  Diet and Activity recommendation: See Discharge Instructions below   Discharge Instructions    Ambulatory referral to Gynecology   Complete by:  As directed    Menorrhagia with symptomatic anemia   Ambulatory referral to Physical Therapy   Complete by:  As directed    Iontophoresis - 4 mg/ml of dexamethasone:  No   T.E.N.S. Unit Evaluation and Dispense as Indicated:  No   Discharge instructions   Complete by:  As directed    Please try and establish care  at the community health and wellness center in the next 1 to 2 weeks if possible.  Please call their office on Monday numbers provided should be provided.  May need to call around to different doctor offices if appointments not available in the next month.  They should check a CBC and BMP during  your visit.  We have recommended that you start taking ferrous sulfate twice daily to build back up your red blood cells.  I have sent in a referral for you to be seen by gynecology.  For lower extremity swelling and shortness of breath we have provided you with a prescription for hydrochlorothiazide, low-dose potassium supplement, and an albuterol inhaler. ( we routinely change or add medications that can affect your baseline labs and fluid status, therefore we recommend that you get the mentioned basic workup next visit with your PCP, your PCP may decide not to get them or add new tests based on their clinical decision)  Activity: As tolerated   Disposition Home   Diet: Heart Healthy         For Heart failure patients - Check your Weight same time everyday, if you gain over 2 pounds, or you develop in leg swelling, experience more shortness of breath or chest pain, call your Primary doctor immediately. Follow Cardiac Low Salt Diet and 1.5 lit/day fluid restriction.  Special Instructions: If you have smoked or chewed Tobacco  in the last 2 yrs please stop smoking, stop any regular Alcohol  and or any Recreational drug use.  On your next visit with your primary care physician please Get Medicines reviewed and adjusted.  Please request your Patient, No Pcp Per to go over all Hospital Tests and Procedure/Radiological results at the follow up, please get all Hospital records sent to your Prim MD by signing hospital release before you go home.  If you experience worsening of your admission symptoms, develop shortness of breath, life threatening emergency, suicidal or homicidal thoughts you must seek medical  attention immediately by calling 911 or calling your MD immediately  if symptoms less severe.  You Must read complete instructions/literature along with all the possible adverse reactions/side effects for all the Medicines you take and that have been prescribed to you. Take any new Medicines after you have completely understood and accpet all the possible adverse reactions/side effects.   Do not drive, operate heavy machinery, perform activities at heights, swimming or participation in water activities or provide baby sitting services if your were admitted for syncope or siezures until you have seen by Primary MD or a Neurologist and advised to do so again.  Wear Seat belts while driving.   Please note  You were cared for by a hospitalist during your hospital stay. If you have any questions about your discharge medications or the care you received while you were in the hospital after you are discharged, you can call the unit and asked to speak with the hospitalist on call if the hospitalist that took care of you is not available. Once you are discharged, your primary care physician will handle any further medical issues. Please note that NO REFILLS for any discharge medications will be authorized once you are discharged, as it is imperative that you return to your primary care physician (or establish a relationship with a primary care physician if you do not have one) for your aftercare needs so that they can reassess your need for medications and monitor your lab values.   Increase activity slowly   Complete by:  As directed         Discharge Medications     Allergies as of 10/14/2018   No Known Allergies     Medication List    TAKE these medications   albuterol 108 (90 Base) MCG/ACT inhaler Commonly known as:  PROVENTIL HFA;VENTOLIN HFA  Inhale 2 puffs into the lungs every 6 (six) hours as needed for wheezing or shortness of breath.   ferrous sulfate 325 (65 FE) MG tablet Take 1  tablet (325 mg total) by mouth 2 (two) times daily with a meal.   hydrochlorothiazide 12.5 MG tablet Commonly known as:  HYDRODIURIL Take 1 tablet (12.5 mg total) by mouth daily.   potassium chloride 10 MEQ tablet Commonly known as:  K-DUR Take 1 tablet (10 mEq total) by mouth daily.       Major procedures and Radiology Reports - PLEASE review detailed and final reports for all details, in brief -   Echocardiogram 10/13/2018: Impression  1. The left ventricle has normal systolic function of 60-65%. The cavity size was normal. There is no increased left ventricular wall thickness. Echo evidence of normal diastolic relaxation.  2. The right ventricle has normal systolic function. The cavity was normal. There is no increase in right ventricular wall thickness.  3. The mitral valve is normal in structure. No evidence of mitral valve stenosis.  4. The tricuspid valve is normal in structure.  5. The aortic valve has an indeterminant number of cusps.  6. The aortic root is normal in size and structure.  7. No evidence of left ventricular regional wall motion abnormalities.  Dg Chest 2 View  Result Date: 10/13/2018 CLINICAL DATA:  Right chest pain, fever EXAM: CHEST - 2 VIEW COMPARISON:  None. FINDINGS: Mild cardiomegaly. Prominent main pulmonary artery contour. Otherwise normal mediastinal contour. No pneumothorax. No pleural effusion. Mild pulmonary edema. No acute consolidative airspace disease. IMPRESSION: Mild congestive heart failure. Prominence of the main pulmonary artery contour suggests pulmonary arterial hypertension. Electronically Signed   By: Delbert PhenixJason A Poff M.D.   On: 10/13/2018 00:02    Micro Results   No results found for this or any previous visit (from the past 240 hour(s)).     Today   Subjective    Kaylee [name] today reports that she feels much better and her breathing is improving some.   Objective   Blood pressure 136/79, pulse 86, temperature 98.7 F (37.1 C),  temperature source Oral, resp. rate 18, height 5\' 7"  (1.702 m), SpO2 98 %.   Intake/Output Summary (Last 24 hours) at 10/14/2018 1313 Last data filed at 10/14/2018 1022 Gross per 24 hour  Intake 960 ml  Output -  Net 960 ml    Exam  Constitutional: Morbidly obese female NAD, calm, comfortable Eyes: PERRL, lids and conjunctivae normal ENMT: Mucous membranes are moist. Posterior pharynx clear of any exudate or lesions. Neck: normal, supple, no masses, no thyromegaly Respiratory: Decreased overall aeration with no wheezes appreciated.  Patient able to talk in complete sentences currently on room air. Cardiovascular: Regular rate and rhythm, no murmurs / rubs / gallops.  Trace lower extremity edema. 2+ pedal pulses. No carotid bruits.  Abdomen: no tenderness, no masses palpated. No hepatosplenomegaly. Bowel sounds positive.  Musculoskeletal: no clubbing / cyanosis. No joint deformity upper and lower extremities. Good ROM, no contractures. Normal muscle tone.  Skin: no rashes, lesions, ulcers. No induration Neurologic: CN 2-12 grossly intact. Sensation intact, DTR normal. Strength 5/5 in all 4.  Psychiatric: Normal judgment and insight. Alert and oriented x 3. Normal mood.    Data Review   CBC w Diff:  Lab Results  Component Value Date   WBC 7.5 10/14/2018   HGB 8.6 (L) 10/14/2018   HCT 33.1 (L) 10/14/2018   PLT 293 10/14/2018   LYMPHOPCT 30 10/14/2018  MONOPCT 7 10/14/2018   EOSPCT 3 10/14/2018   BASOPCT 2 10/14/2018    CMP:  Lab Results  Component Value Date   NA 135 10/12/2018   K 3.8 10/12/2018   CL 106 10/12/2018   CO2 20 (L) 10/12/2018   BUN 5 (L) 10/12/2018   CREATININE 0.56 10/12/2018   PROT 8.4 (H) 10/12/2018   ALBUMIN 2.7 (L) 10/12/2018   BILITOT <0.1 (L) 10/12/2018   ALKPHOS 82 10/12/2018   AST 22 10/12/2018   ALT 11 10/12/2018  .   Total Time in preparing paper work, data evaluation and todays exam - 35 minutes  Clydie Braun M.D on 10/14/2018 at 1:13  PM  [facility]   Office  [phone]

## 2018-10-14 NOTE — Care Management (Signed)
Outpatient PT referral placed per PT.  Pt is uninsured and need for PT seems to be centered around endurance/ dizziness/ ? Of vestibular disorder (per Dr. Katrinka Blazing). Pt may not receive OPPT due to lack of insurance and lack of applicable diagnosis.  Pt given number and advised to f/u. OPPT will also contact patient.

## 2018-10-14 NOTE — Progress Notes (Signed)
Pt alert and oriented x4. Pt understood and followed care plan. Pt verbalized understanding of test and procedure results. Pt denied chest pain or any other complaints during shift. Vitals within defined limits. Pt shows ability to manage health-care related needs upon discharge.

## 2018-10-14 NOTE — Evaluation (Signed)
Physical Therapy Evaluation Patient Details Name: Kaylee [name] MRN: [id number] DOB: 1971/06/03 Today's Date: 10/14/2018   History of Present Illness  Kaylee [name] is a 48 y.o. female with medical history significant of morbid obesity, heavy menstrual, possible cardiomyopathy (patient states that she has enlarged heart), who presents with chest pain.     Clinical Impression  Patient evaluated by Physical Therapy with no further acute PT needs identified. All education has been completed and the patient has no further questions. PTA pt living with children, independent with mobility reports decreased activity tolerance and numbness in her RLE with rest and with activity. +Slump test, numbness and pain gets better with lumbar extension. Today patient ambulating unit and stairs without assistance, DOE 2/4 during 6 minute walk test 350' with multiple rest breaks.  Rec OP PT. See below for any follow-up Physical Therapy or equipment needs. PT is signing off. Thank you for this referral.     Follow Up Recommendations Outpatient PT    Equipment Recommendations  None recommended by PT    Recommendations for Other Services       Precautions / Restrictions Restrictions Weight Bearing Restrictions: No      Mobility  Bed Mobility Overal bed mobility: Independent                Transfers Overall transfer level: Independent Equipment used: None                Ambulation/Gait Ambulation/Gait assistance: Supervision Gait Distance (Feet): 350 Feet Assistive device: None       General Gait Details: no overt LOB. DOE 2/4 with 6 minutes of walking with frequent rest breaks  Stairs Stairs: Yes Stairs assistance: Min guard Stair Management: One rail Right;Step to pattern;Sideways      Wheelchair Mobility    Modified Rankin (Stroke Patients Only)       Balance Overall balance assessment: Mild deficits observed, not formally tested                                            Pertinent Vitals/Pain Pain Assessment: No/denies pain    Home Living Family/patient expects to be discharged to:: Private residence Living Arrangements: Children Available Help at Discharge: Family Type of Home: Apartment Home Access: Stairs to enter Entrance Stairs-Rails: Right Entrance Stairs-Number of Steps: [place]: One level Home Equipment: None      Prior Function Level of Independence: Independent         Comments: I with ADLs     Hand Dominance   Dominant Hand: Right    Extremity/Trunk Assessment   Upper Extremity Assessment Upper Extremity Assessment: Overall WFL for tasks assessed    Lower Extremity Assessment Lower Extremity Assessment: Overall WFL for tasks assessed    Cervical / Trunk Assessment Cervical / Trunk Assessment: Normal  Communication   Communication: No difficulties  Cognition Arousal/Alertness: Awake/alert Behavior During Therapy: WFL for tasks assessed/performed Overall Cognitive Status: Within Functional Limits for tasks assessed                                        General Comments General comments (skin integrity, edema, etc.): 6 minute walk test, 350 feet, SpO2 WNL DOE 2/4. mulitple rest breaks.     Exercises     Assessment/Plan  PT Assessment All further PT needs can be met in the next venue of care  PT Problem List         PT Treatment Interventions      PT Goals (Current goals can be found in the Care Plan section)  Acute Rehab PT Goals Patient Stated Goal: go home PT Goal Formulation: With patient    Frequency     Barriers to discharge        Co-evaluation               AM-PAC PT "6 Clicks" Mobility  Outcome Measure Help needed turning from your back to your side while in a flat bed without using bedrails?: None Help needed moving from lying on your back to sitting on the side of a flat bed without using bedrails?: None Help needed moving to  and from a bed to a chair (including a wheelchair)?: None Help needed standing up from a chair using your arms (e.g., wheelchair or bedside chair)?: None Help needed to walk in hospital room?: A Little Help needed climbing 3-5 steps with a railing? : A Little 6 Click Score: 22    End of Session Equipment Utilized During Treatment: Gait belt Activity Tolerance: Patient tolerated treatment well Patient left: in bed;with call bell/phone within reach Nurse Communication: Mobility status      Time: 3435-6861 PT Time Calculation (min) (ACUTE ONLY): 25 min   Charges:   PT Evaluation $PT Eval Low Complexity: 1 Low PT Treatments $Gait Training: 8-22 mins        Reinaldo Berber, PT, DPT Acute Rehabilitation Services Pager: [phone] Office: [place] 10/14/2018, 9:39 AM

## 2018-10-18 LAB — CULTURE, BLOOD (ROUTINE X 2)
Culture: NO GROWTH
Culture: NO GROWTH
Special Requests: ADEQUATE

## 2018-10-24 NOTE — Progress Notes (Signed)
Patient ID: Kaylee [name], female   DOB: Sep 27, 1970, 48 y.o.   MRN: [id number]       Kaylee [name], is a 48 y.o. female  EVO:[id number]  [id number]:[id number]  DOB - 1970-11-21  Subjective:  Chief Complaint and HPI: Kaylee [name] is a 48 y.o. female here today to establish care and for a follow up visit After hospitalization 2/7-10/14/2018.  Feeling tired but better overall. No sex in 6 years  From discharge summary:  1.  Atypical chest pain: Resolving.  Patient reports having right-sided chest pain that was reproducible with palpation and arm movements.  He has been treated with full dose aspirin.  Troponins negative x3 and EKG without significant ischemic changes.  Echocardiogram noted EF of 60-65%. Cardiology evaluated and thought symptoms did not warrant further work-up.  Suspect likely costochondritis.  2.  Iron deficiency with symptomatic anemia: Acute.   On admission hemoglobin noted to be as low as 7.3.  Patient was transfused 2 units of packed red blood cells.  Hemoglobin 8.1->8.6 prior to discharge.  She was noted to have low MCV and MCH likely related with menorrhagia.  Patient was started on ferrous sulfate.  3.  Menorrhagia: Patient reports last menstrual cycle on January 10 lasting 7 days.  She reports having significant clotting with menstrual periods.  Ambulatory referral sent for OB/GYN, but patient also given the number to the women's outpatient clinic to call and try and set up appointment.  4.  Cardiomegaly, diastolic dysfunction: Chronic.  She reports previously being told that she had enlarged heart.  Patient was discharged home on low-dosehydrochlorothiazide 12.5 mg daily and potassium chloride 10 mEq daily.    5.  Dyspnea, pulmonary edema, morbid obesity: Patient reports continued shortness of breath with exertion.  Chest x-ray did note some signs of mild heart failure.  Patient was given 40 mg of Lasix IV tonight prior to discharge with some improvement in breathing. A  weight was not able to be obtained during hospital stay. Patient ambulated with physical therapy 350 feet with multiple breaks, but O2 saturations noted to be within normal limits.  Medications as seen above.  In addition patient was prescribed albuterol inhaler.  Physical therapy recommending outpatient PT.  Care management to help to arrange.  Symptoms thought to be likely multifactorial in the setting of anemia and deconditioning.  6.  Lactic acidosis: Resolved.  Patient's initial lactic acid elevated up to 2.6, but resolved with administration of blood products.  Patient just recently moved from [facility] and is in need of a primary care provider.  She was given information to call the community health [facility] wellness center to try and establish care.  ED/Hospital notes reviewed.   Social History: Family history:  ROS:   Constitutional:  No f/c, No night sweats, No unexplained weight loss. EENT:  No vision changes, No blurry vision, No hearing changes. No mouth, throat, or ear problems.  Respiratory: No cough, No SOB Cardiac: No CP, no palpitations GI:  No abd pain, No N/V/D. GU: No Urinary s/sx Musculoskeletal: No joint pain Neuro: No headache, no dizziness, no motor weakness.  Skin: No rash Endocrine:  No polydipsia. No polyuria.  Psych: Denies SI/HI  No problems updated.  ALLERGIES: No Known Allergies  PAST MEDICAL HISTORY: Past Medical History:  Diagnosis Date  . Heavy menstrual period   . Morbid obesity (HCC)     MEDICATIONS AT HOME: Prior to Admission medications   Medication Sig Start Date End Date Taking? Authorizing Provider  albuterol (PROVENTIL HFA;VENTOLIN HFA) 108 (90 Base) MCG/ACT inhaler Inhale 2 puffs into the lungs every 6 (six) hours as needed for wheezing or shortness of breath. 10/14/18  Yes Clydie Braun, MD  ferrous sulfate 325 (65 FE) MG tablet Take 1 tablet (325 mg total) by mouth 2 (two) times daily with a meal. 10/25/18  Yes  Raiza Kiesel M, PA-C  hydrochlorothiazide (HYDRODIURIL) 12.5 MG tablet Take 1 tablet (12.5 mg total) by mouth daily. 10/25/18  Yes Georgian Co M, PA-C  potassium chloride (K-DUR) 10 MEQ tablet Take 1 tablet (10 mEq total) by mouth daily. 10/25/18  Yes Georgian Co M, PA-C  meloxicam (MOBIC) 7.5 MG tablet Take 1 tablet (7.5 mg total) by mouth daily. 10/25/18   Anders Simmonds, PA-C     Objective:  EXAM:   Vitals:   10/25/18 1010  BP: 138/80  Pulse: (!) 108  Resp: 18  Temp: 98.3 F (36.8 C)  TempSrc: Oral  SpO2: 100%  Weight: (!) 391 lb (177.4 kg)  Height: 5\' 7"  (1.702 m)    General appearance : A&OX3. NAD. Non-toxic-appearing, morbidly obese HEENT: Atraumatic and Normocephalic.  PERRLA. EOM intact.  TM clear B. Mouth-MMM, post pharynx WNL w/o erythema, No PND. Neck: supple, no JVD. No cervical lymphadenopathy. No thyromegaly Chest/Lungs:  Breathing-non-labored, Good air entry bilaterally, breath sounds normal without rales, rhonchi, or wheezing  CVS: S1 S2 regular, no murmurs, gallops, rubs  Extremities: Bilateral Lower Ext shows no edema, both legs are warm to touch with = pulse throughout Neurology:  CN II-XII grossly intact, Non focal.   Psych:  TP linear. J/I WNL. Normal speech. Appropriate eye contact and affect.  Skin:  No Rash  Data Review Lab Results  Component Value Date   HGBA1C 5.5 10/13/2018     Assessment & Plan   1. Osteoarthritis, unspecified osteoarthritis type, unspecified site - meloxicam (MOBIC) 7.5 MG tablet; Take 1 tablet (7.5 mg total) by mouth daily.  Dispense: 30 tablet; Refill: 2  2. Microcytic anemia - ferrous sulfate 325 (65 FE) MG tablet; Take 1 tablet (325 mg total) by mouth 2 (two) times daily with a meal.  Dispense: 60 tablet; Refill: 2 - CBC with Differential/Platelet - Ambulatory referral to Gynecology  3. Menorrhagia with regular cycle - CBC with Differential/Platelet - Ambulatory referral to Gynecology  4. Hypertension,  unspecified type - potassium chloride (K-DUR) 10 MEQ tablet; Take 1 tablet (10 mEq total) by mouth daily.  Dispense: 30 tablet; Refill: 2 - hydrochlorothiazide (HYDRODIURIL) 12.5 MG tablet; Take 1 tablet (12.5 mg total) by mouth daily.  Dispense: 30 tablet; Refill: 2 - Basic metabolic panel  Patient have been counseled extensively about nutrition and exercise  Return in about 1 month (around 11/23/2018) for assign PCP-f/up anemia.  The patient was given clear instructions to go to ER or return to medical center if symptoms don't improve, worsen or new problems develop. The patient verbalized understanding. The patient was told to call to get lab results if they haven't heard anything in the next week.     Georgian Co, PA-C [facility] [place], [place] [phone]   10/25/2018, 10:22 AM

## 2018-10-25 ENCOUNTER — Ambulatory Visit: Payer: MEDICAID | Admitting: Physical Therapy

## 2018-10-25 ENCOUNTER — Telehealth: Payer: Self-pay

## 2018-10-25 ENCOUNTER — Telehealth: Payer: Self-pay | Admitting: General Practice

## 2018-10-25 ENCOUNTER — Ambulatory Visit: Payer: Self-pay | Attending: Family Medicine | Admitting: Physician Assistant

## 2018-10-25 VITALS — BP 138/80 | HR 108 | Temp 98.3°F | Resp 18 | Ht 67.0 in | Wt 391.0 lb

## 2018-10-25 DIAGNOSIS — I10 Essential (primary) hypertension: Secondary | ICD-10-CM

## 2018-10-25 DIAGNOSIS — M1990 Unspecified osteoarthritis, unspecified site: Secondary | ICD-10-CM

## 2018-10-25 DIAGNOSIS — D509 Iron deficiency anemia, unspecified: Secondary | ICD-10-CM

## 2018-10-25 DIAGNOSIS — N920 Excessive and frequent menstruation with regular cycle: Secondary | ICD-10-CM

## 2018-10-25 MED ORDER — POTASSIUM CHLORIDE ER 10 MEQ PO TBCR: 10.0000 meq | EXTENDED_RELEASE_TABLET | Freq: Every day | ORAL | 2 refills | Status: DC

## 2018-10-25 MED ORDER — FERROUS SULFATE 325 (65 FE) MG PO TABS: 325.0000 mg | ORAL_TABLET | Freq: Two times a day (BID) | ORAL | 2 refills | Status: DC

## 2018-10-25 MED ORDER — MELOXICAM 7.5 MG PO TABS: 7.5000 mg | ORAL_TABLET | Freq: Every day | ORAL | 2 refills | Status: DC

## 2018-10-25 MED ORDER — HYDROCHLOROTHIAZIDE 12.5 MG PO TABS: 12.5000 mg | ORAL_TABLET | Freq: Every day | ORAL | 2 refills | Status: DC

## 2018-10-25 NOTE — Telephone Encounter (Signed)
New Message   Pt came in and dropped of a letter for transportation, I placed it on your desk. Please f/u

## 2018-10-25 NOTE — Telephone Encounter (Signed)
Met with the patient when she was in the clinic today.  Provided her with a [employer] application as well as an application for [place]  transportation and mobility services (TAMS).  She completed the TAMS application and it was mailed.  She was also given the application for the [facility] Card that would allow her to charge her medications at [facility] to an account that she could pay off at a later date as she only has family planning medicaid. She was encouraged to complete this application as soon as possible.  She was given a one time free fill for her medications today at [facility]

## 2018-10-26 LAB — CBC WITH DIFFERENTIAL/PLATELET
Basophils Absolute: 0.1 10*3/uL (ref 0.0–0.2)
Basos: 1 %
EOS (ABSOLUTE): 0.1 10*3/uL (ref 0.0–0.4)
EOS: 1 %
HEMATOCRIT: 36.3 % (ref 34.0–46.6)
HEMOGLOBIN: 10.3 g/dL — AB (ref 11.1–15.9)
Immature Grans (Abs): 0 10*3/uL (ref 0.0–0.1)
Immature Granulocytes: 0 %
Lymphocytes Absolute: 3.7 10*3/uL — ABNORMAL HIGH (ref 0.7–3.1)
Lymphs: 42 %
MCH: 20.2 pg — ABNORMAL LOW (ref 26.6–33.0)
MCHC: 28.4 g/dL — ABNORMAL LOW (ref 31.5–35.7)
MCV: 71 fL — ABNORMAL LOW (ref 79–97)
Monocytes Absolute: 0.6 10*3/uL (ref 0.1–0.9)
Monocytes: 7 %
Neutrophils Absolute: 4.4 10*3/uL (ref 1.4–7.0)
Neutrophils: 49 %
Platelets: 560 10*3/uL — ABNORMAL HIGH (ref 150–450)
RBC: 5.11 x10E6/uL (ref 3.77–5.28)
RDW: 24.4 % — AB (ref 11.7–15.4)
WBC: 8.9 10*3/uL (ref 3.4–10.8)

## 2018-10-26 LAB — BASIC METABOLIC PANEL
BUN/Creatinine Ratio: 14 (ref 9–23)
BUN: 8 mg/dL (ref 6–24)
CO2: 22 mmol/L (ref 20–29)
Calcium: 10.6 mg/dL — ABNORMAL HIGH (ref 8.7–10.2)
Chloride: 99 mmol/L (ref 96–106)
Creatinine, Ser: 0.59 mg/dL (ref 0.57–1.00)
GFR calc Af Amer: 126 mL/min/{1.73_m2} (ref >59)
GFR, EST NON AFRICAN AMERICAN: 109 mL/min/{1.73_m2} (ref >59)
Glucose: 99 mg/dL (ref 65–99)
Potassium: 4.7 mmol/L (ref 3.5–5.2)
Sodium: 133 mmol/L — ABNORMAL LOW (ref 134–144)

## 2018-10-28 ENCOUNTER — Encounter: Payer: Self-pay | Admitting: Physician Assistant

## 2018-10-31 ENCOUNTER — Telehealth: Payer: Self-pay

## 2018-10-31 NOTE — Telephone Encounter (Signed)
SCAT application faxed to SCAT eligibility  

## 2018-11-02 ENCOUNTER — Telehealth: Payer: Self-pay

## 2018-11-02 NOTE — Telephone Encounter (Signed)
Call placed to Kaylee [name]/SCAT who confirmed that she received the SCAT application 

## 2018-11-12 ENCOUNTER — Encounter: Payer: Self-pay | Admitting: Physician Assistant

## 2018-11-14 NOTE — Telephone Encounter (Signed)
Patient verified DOB Patient is aware of iron improving and states she does take the supplement daily. Patient was scheduled and assigned to a PCP for the end of the month.

## 2018-11-29 ENCOUNTER — Ambulatory Visit: Payer: Medicaid Other | Admitting: Family Medicine

## 2018-12-26 ENCOUNTER — Ambulatory Visit (INDEPENDENT_AMBULATORY_CARE_PROVIDER_SITE_OTHER): Payer: Self-pay | Admitting: Obstetrics & Gynecology

## 2018-12-26 ENCOUNTER — Other Ambulatory Visit: Payer: Self-pay

## 2018-12-26 ENCOUNTER — Telehealth: Payer: Self-pay | Admitting: Obstetrics & Gynecology

## 2018-12-26 DIAGNOSIS — N924 Excessive bleeding in the premenopausal period: Secondary | ICD-10-CM

## 2018-12-26 DIAGNOSIS — Z0000 Encounter for general adult medical examination without abnormal findings: Secondary | ICD-10-CM

## 2018-12-26 MED ORDER — MEGESTROL ACETATE 40 MG PO TABS: 40.0000 mg | ORAL_TABLET | Freq: Two times a day (BID) | ORAL | 5 refills | Status: DC

## 2018-12-26 MED ORDER — MISOPROSTOL 200 MCG PO TABS: ORAL_TABLET | ORAL | 0 refills | Status: DC

## 2018-12-26 NOTE — Telephone Encounter (Signed)
Spoke with patient and got her scheduled for her endo and pap on 5/20 @ 8:55. Patient verbalized understanding and stated she had the office's new address. No further questions.

## 2018-12-26 NOTE — Progress Notes (Signed)
   TELEHEALTH VIRTUAL GYNECOLOGY VISIT ENCOUNTER NOTE  I connected with Kaylee [name] on 12/26/18 at  1:15 PM EDT by WebEx at home and verified that I am speaking with the correct person using two identifiers.   I discussed the limitations, risks, security and privacy concerns of performing an evaluation and management service by telephone and the availability of in person appointments. I also discussed with the patient that there may be a patient responsible charge related to this service. The patient expressed understanding and agreed to proceed.   History:  Kaylee [name] is a 48 y.o. separated P4 (26, 25, 22, and 63 yo kids, 2 grands)  being evaluated today for follow up after an admission to hospital with transfusion of 2 units of PRBCs. She was admitted with chest pain and was found to be anemic. Her hbg after 2 units of PRBCs was 10, pre transfusion, 7.3.  She has a long h/o heavy periods since 48 yo. Tylenol and IBU no help.  She has not had a gyn u/s or work up.   She has been abstinent for 6 years, s/p BTL.  She is not bleeding currently, taking iron pills daily.   Past Medical History:  Diagnosis Date  . Heavy menstrual period   . Morbid obesity (HCC)    Past Surgical History:  Procedure Laterality Date  . s/p of leep cervix     The following portions of the patient's history were reviewed and updated as appropriate: allergies, current medications, past family history, past medical history, past social history, past surgical history and problem list.   Health Maintenance:  Pap and negative HRHPV in 2018. She doesn't think that it was normal.   Normal mammogram never .   Review of Systems:  Pertinent items noted in HPI and remainder of comprehensive ROS otherwise negative.  Physical Exam:   General:  Alert, oriented and cooperative.   Mental Status: Normal mood and affect perceived. Normal judgment and thought content.  Physical exam deferred due to nature of the encounter   Labs and Imaging No results found for this or any previous visit (from the past 336 hour(s)). No results found.    Assessment and Plan:     Menorrhagia- check TSH and gyn u/s EMBX and pap smear at next visit Megace 40 mg BID Mammogram ordered      I discussed the assessment and treatment plan with the patient. The patient was provided an opportunity to ask questions and all were answered. The patient agreed with the plan and demonstrated an understanding of the instructions.   The patient was advised to call back or seek an in-person evaluation/go to the ED if the symptoms worsen or if the condition fails to improve as anticipated.  I provided 10 minutes of non-face-to-face time during this encounter.   Allie Bossier, MD [facility] for [employer], [facility]

## 2019-01-02 ENCOUNTER — Encounter: Payer: Self-pay | Admitting: Family Medicine

## 2019-01-08 ENCOUNTER — Encounter: Payer: Self-pay | Admitting: Family Medicine

## 2019-01-09 ENCOUNTER — Other Ambulatory Visit: Payer: Self-pay | Admitting: Family Medicine

## 2019-01-09 DIAGNOSIS — I10 Essential (primary) hypertension: Secondary | ICD-10-CM

## 2019-01-09 DIAGNOSIS — D509 Iron deficiency anemia, unspecified: Secondary | ICD-10-CM

## 2019-01-09 MED ORDER — POTASSIUM CHLORIDE ER 10 MEQ PO TBCR: 10.0000 meq | EXTENDED_RELEASE_TABLET | Freq: Every day | ORAL | 0 refills | Status: DC

## 2019-01-09 MED ORDER — FERROUS SULFATE 325 (65 FE) MG PO TABS: 325.0000 mg | ORAL_TABLET | Freq: Two times a day (BID) | ORAL | 0 refills | Status: DC

## 2019-01-09 MED ORDER — HYDROCHLOROTHIAZIDE 12.5 MG PO TABS: 12.5000 mg | ORAL_TABLET | Freq: Every day | ORAL | 0 refills | Status: DC

## 2019-01-09 NOTE — Progress Notes (Signed)
Patient ID: Kaylee [name], female   DOB: May 27, 1971, 48 y.o.   MRN: [id number]   Patient left my chart message stating that she does not think that she qualifies for treatment through this clinic as she is unemployed and her family helps with her expenses so she does not think that she has documents to submit. She also needs additional medication refills so that she does not run out. Message was sent to patient that social work consult would be placed. 30 day supply of medications sent to [facility] at [facility]

## 2019-01-09 NOTE — Progress Notes (Signed)
Patient ID: Kaylee [name], female   DOB: 06/04/1971, 48 y.o.   MRN: [id number]   See my chart message from patient and previous order note. Patient requested a 90 day refill of her current medications- RX's sent in for HCTZ, potassium and Kcl and she will need to contact GYN about other medications. Social work consult was placed as patient does not believe that she will qualify to be a patient here.

## 2019-01-09 NOTE — Telephone Encounter (Signed)
Patient mychart concern.

## 2019-01-09 NOTE — Telephone Encounter (Signed)
Patient request via mychart.

## 2019-01-16 ENCOUNTER — Encounter: Payer: Self-pay | Admitting: Family Medicine

## 2019-01-17 ENCOUNTER — Other Ambulatory Visit: Payer: Self-pay

## 2019-01-17 ENCOUNTER — Encounter: Payer: Self-pay | Admitting: Family Medicine

## 2019-01-17 ENCOUNTER — Ambulatory Visit: Payer: Self-pay | Attending: Family Medicine | Admitting: Family Medicine

## 2019-01-17 DIAGNOSIS — N920 Excessive and frequent menstruation with regular cycle: Secondary | ICD-10-CM

## 2019-01-17 DIAGNOSIS — I517 Cardiomegaly: Secondary | ICD-10-CM

## 2019-01-17 DIAGNOSIS — D509 Iron deficiency anemia, unspecified: Secondary | ICD-10-CM

## 2019-01-17 DIAGNOSIS — M170 Bilateral primary osteoarthritis of knee: Secondary | ICD-10-CM | POA: Insufficient documentation

## 2019-01-17 MED ORDER — MELOXICAM 7.5 MG PO TABS: 7.5000 mg | ORAL_TABLET | Freq: Every day | ORAL | 1 refills | Status: DC

## 2019-01-17 NOTE — Progress Notes (Signed)
Virtual Visit via Telephone Note  I connected with Kaylee [name] on 01/17/19 at  1:30 PM EDT by telephone and verified that I am speaking with the correct person using two identifiers.   I discussed the limitations, risks, security and privacy concerns of performing an evaluation and management service by telephone and the availability of in person appointments. I also discussed with the patient that there may be a patient responsible charge related to this service. The patient expressed understanding and agreed to proceed.  Patient Location: Home Provider Location: Office Others participating in call: call initiated by Kaylee [name], RMA   History of Present Illness:      48 year old female new to me as a patient who was last seen in the office on 10/25/2018 in follow-up of hospitalization from 10/12/2018 to 10/14/2018 due to chest pain and patient was found to have symptomatic anemia due to heavy menses.  Patient was found to have a hemoglobin of 7.3 and received transfusion.  She also reported per discharge summary a history of enlarged heart and patient had chest x-ray which showed a CHF pattern with possible pulmonary hypertension and cardiomegaly.   Patient states that she has followed up with her OB/GYN regarding her heavy menses.       Patient states that one of her main concerns is her weight.  She states that about 2 years ago she was able to lose about 100 pounds with the use of an over-the-counter dietary supplement in addition to increasing her exercise and changing her eating habits.  She has recently started walking again and changing her diet and she reports that she lost a few pounds but then per her home scale she regained about 4 pounds after the onset of her menses going from 391-395.  She wonders if she can again take the weight loss supplement.       She also reports that she would like to have a refill of meloxicam 7.5 mg which she was prescribed to help with knee pain.  She  states that [place] she was told that she had arthritis in her knees which was causing her to have chronic issues with her pain and patient states that at one point she had difficulty walking secondary to her obesity as well as knee pain.  She states that currently her knee pain is about a 2-3 on a 0-to-10 scale and knee pain is worse after she has been sitting for a while and then attempts to get up.  She denies any stomach irritation and does not feel at this time that the meloxicam causes any increase in bleeding.   Past Medical History:  Diagnosis Date  . Heavy menstrual period   . Morbid obesity (HCC)     Past Surgical History:  Procedure Laterality Date  . s/p of leep cervix      Family History  Problem Relation Age of Onset  . Leukemia Mother     Social History   Tobacco Use  . Smoking status: Never Smoker  . Smokeless tobacco: Never Used  Substance Use Topics  . Alcohol use: Not Currently  . Drug use: Not Currently     No Known Allergies  Review of Systems  Constitutional: Positive for malaise/fatigue. Negative for chills and fever.  HENT: Negative for congestion and sore throat.   Respiratory: Positive for shortness of breath (Some shortness of breath with exertion). Negative for cough.   Cardiovascular: Negative for chest  pain and palpitations.  Gastrointestinal: Negative for abdominal pain, constipation, diarrhea, heartburn, nausea and vomiting.  Genitourinary: Negative for dysuria and frequency.  Musculoskeletal: Positive for joint pain. Negative for myalgias.  Skin: Negative for itching and rash.  Neurological: Negative for dizziness and headaches.  Endo/Heme/Allergies: Negative for polydipsia. Does not bruise/bleed easily.     Observations/Objective: No vital signs or physical exam conducted as visit was done via telephone due to current restrictions/limitations on office visits because of the current COVID-19 pandemic   Assessment and Plan: 1. Primary osteoarthritis of both knees Patient provided with refill of meloxicam 1 p.o. daily to take as needed for knee pain secondary to osteoarthritis.  Patient is encouraged to continue to stay mobile through low impact exercise such as walking as well as continued efforts at weight loss.  Patient should stop use of meloxicam if she has stomach upset, GI bleed or worsening of anemia. - meloxicam (MOBIC) 7.5 MG tablet; Take 1 tablet (7.5 mg total) by mouth daily. As needed for arthritis pain  Dispense: 90 tablet; Refill: 1  2. Microcytic anemia Patient with microcytic anemia and is status post hospitalization in February for symptomatic anemia with atypical chest pain with initial hemoglobin of 7.3 which improved to 10.3 status post transfusion.  Hemoglobin was stable at 10.3 when checked by OB/GYN on 10/25/2018.  Patient is asked to return for lab visit to repeat CBC and to continue iron supplement. - CBC with Differential; Future - Ambulatory referral to Cardiology  3. Menorrhagia with regular cycle Patient with anemia secondary to menorrhagia and during hospitalization had findings on chest x-ray suggestive of cardiomegaly, possible CHF and pulmonary hypertension.  Patient's cardiovascular findings could be related to her chronic issues with anemia.  She has been referred to cardiology for further evaluation and treatment - CBC with Differential; Future - Ambulatory referral to Cardiology  4. Cardiomegaly Patient with chest x-ray during her hospitalization in February of this year with findings suggestive of cardiomegaly as well as CHF.  Cardiology referral placed - Ambulatory referral to Cardiology  5. Morbid obesity (HCC) At her most recent visit to this office on 10/25/2018 patient with a weight of 391 consistent with morbid obesity.  She also however had some tachycardia with vital signs.  She is interested in taking an over-the-counter supplement which she took  in the past that helped with weight loss but this is likely a stimulant type medication.  She was asked to follow-up with cardiology prior to starting any supplements for weight loss.  She may benefit from a supervised weight loss program at some point including consideration for possible bariatric surgery   Follow Up Instructions:Return in about 4 months (around 05/20/2019) for labs in 4 weeks and chronic issues.    I discussed the assessment and treatment plan with the patient. The patient was provided an opportunity to ask questions and all were answered. The patient agreed with the plan and demonstrated an understanding of the instructions.   The patient was advised to call back or seek an in-person evaluation if the symptoms worsen or if the condition fails to improve as anticipated.  I provided 14 minutes of non-face-to-face time during this encounter.   Cain Saupe, MD

## 2019-01-17 NOTE — Progress Notes (Signed)
Med refills, est care and talk about anemia. Per pt her anemia is doing a lot better and able to move around a lot more.

## 2019-01-21 ENCOUNTER — Other Ambulatory Visit: Payer: Self-pay | Admitting: Family Medicine

## 2019-01-21 DIAGNOSIS — M170 Bilateral primary osteoarthritis of knee: Secondary | ICD-10-CM

## 2019-01-21 DIAGNOSIS — I10 Essential (primary) hypertension: Secondary | ICD-10-CM

## 2019-01-21 MED ORDER — MELOXICAM 7.5 MG PO TABS: 7.5000 mg | ORAL_TABLET | Freq: Every day | ORAL | 0 refills | Status: DC

## 2019-01-21 MED ORDER — HYDROCHLOROTHIAZIDE 12.5 MG PO TABS: 12.5000 mg | ORAL_TABLET | Freq: Every day | ORAL | 0 refills | Status: DC

## 2019-01-21 MED ORDER — POTASSIUM CHLORIDE ER 10 MEQ PO TBCR: 10.0000 meq | EXTENDED_RELEASE_TABLET | Freq: Every day | ORAL | 0 refills | Status: DC

## 2019-01-21 NOTE — Progress Notes (Signed)
Patient ID: Kaylee [name], female   DOB: 1971-04-23, 48 y.o.   MRN: [id number]   Patient left  My chart message requesting refills of potassium, HCTZ and meloxicam

## 2019-01-21 NOTE — Telephone Encounter (Signed)
Patient refill request -

## 2019-01-23 ENCOUNTER — Other Ambulatory Visit (HOSPITAL_COMMUNITY)
Admission: RE | Admit: 2019-01-23 | Discharge: 2019-01-23 | Disposition: A | Discharge status: Home / Self Care | Payer: Medicaid Other | Source: Ambulatory Visit | Attending: Obstetrics & Gynecology | Admitting: Obstetrics & Gynecology

## 2019-01-23 ENCOUNTER — Other Ambulatory Visit: Payer: Self-pay

## 2019-01-23 ENCOUNTER — Encounter: Payer: Self-pay | Admitting: Obstetrics and Gynecology

## 2019-01-23 ENCOUNTER — Ambulatory Visit (INDEPENDENT_AMBULATORY_CARE_PROVIDER_SITE_OTHER): Payer: Self-pay | Admitting: Obstetrics and Gynecology

## 2019-01-23 VITALS — BP 129/87 | HR 101 | Temp 98.6°F | Wt 391.0 lb

## 2019-01-23 DIAGNOSIS — N840 Polyp of corpus uteri: Secondary | ICD-10-CM | POA: Diagnosis not present

## 2019-01-23 DIAGNOSIS — N939 Abnormal uterine and vaginal bleeding, unspecified: Secondary | ICD-10-CM | POA: Diagnosis present

## 2019-01-23 DIAGNOSIS — D509 Iron deficiency anemia, unspecified: Secondary | ICD-10-CM

## 2019-01-23 DIAGNOSIS — Z0000 Encounter for general adult medical examination without abnormal findings: Secondary | ICD-10-CM | POA: Insufficient documentation

## 2019-01-23 DIAGNOSIS — N898 Other specified noninflammatory disorders of vagina: Secondary | ICD-10-CM

## 2019-01-23 DIAGNOSIS — N920 Excessive and frequent menstruation with regular cycle: Secondary | ICD-10-CM

## 2019-01-23 DIAGNOSIS — Z124 Encounter for screening for malignant neoplasm of cervix: Secondary | ICD-10-CM

## 2019-01-23 DIAGNOSIS — N924 Excessive bleeding in the premenopausal period: Secondary | ICD-10-CM

## 2019-01-23 LAB — POCT PREGNANCY, URINE: Preg Test, Ur: NEGATIVE

## 2019-01-23 NOTE — Progress Notes (Signed)
Ultrasound scheduled for June 1 @ 0800

## 2019-01-23 NOTE — Progress Notes (Addendum)
ENDOMETRIAL BIOPSY      Kaylee [name] is a 48 y.o.  here for endometrial biopsy and pap smear. Also complains of vaginal odor and discharge.   The indications for endometrial biopsy were reviewed.  Risks of the biopsy including cramping, bleeding, infection, uterine perforation, inadequate specimen and need for additional procedures were discussed. The patient states she understands and agrees to undergo procedure today. Consent was signed. Time out was performed.   Indications: AUB, morbid obesity Urine HCG: negative  Patient morbidly obese and required 2 assistants to obtain access (assistance holding tissue out of the field of view). Some stool was noted between the groin folds which was cleaned with peri-pads prior to starting. A bivalve speculum was placed into the vagina and the cervix was easily visualized and was prepped with Betadine x2. A single-toothed tenaculum was placed on the anterior lip of the cervix to stabilize it. The 3 mm pipelle was introduced into the endometrial cavity without difficulty to a depth of 9 cm, and a moderate amount of tissue was obtained and sent to pathology. This was repeated for a total of 3 passes. The instruments were removed from the patient's vagina. Minimal bleeding from the cervix at the tenaculum was noted.   The patient tolerated the procedure well. Routine post-procedure instructions were given to the patient.    TSH/CBC ordered today TVUS to be scheduled today   K. Therese Sarah, M.D. Attending [facility] [employer] [facility])

## 2019-01-23 NOTE — Addendum Note (Signed)
Addended by: Leroy Libman on: 01/23/2019 09:54 AM   Modules accepted: Orders

## 2019-01-24 LAB — CBC WITH DIFFERENTIAL/PLATELET
Basophils Absolute: 0 x10E3/uL (ref 0.0–0.2)
Basos: 0 %
EOS (ABSOLUTE): 0.1 x10E3/uL (ref 0.0–0.4)
Eos: 1 %
Hematocrit: 53.2 % — ABNORMAL HIGH (ref 34.0–46.6)
Hemoglobin: 15.4 g/dL (ref 11.1–15.9)
Immature Grans (Abs): 0 x10E3/uL (ref 0.0–0.1)
Immature Granulocytes: 0 %
Lymphocytes Absolute: 3 x10E3/uL (ref 0.7–3.1)
Lymphs: 33 %
MCH: 29.2 pg (ref 26.6–33.0)
MCHC: 28.9 g/dL — ABNORMAL LOW (ref 31.5–35.7)
MCV: 101 fL — ABNORMAL HIGH (ref 79–97)
Monocytes Absolute: 0.5 x10E3/uL (ref 0.1–0.9)
Monocytes: 6 %
Neutrophils Absolute: 5.5 x10E3/uL (ref 1.4–7.0)
Neutrophils: 60 %
Platelets: 348 x10E3/uL (ref 150–450)
RBC: 5.28 x10E6/uL (ref 3.77–5.28)
RDW: 17.7 % — ABNORMAL HIGH (ref 11.7–15.4)
WBC: 9.1 x10E3/uL (ref 3.4–10.8)

## 2019-01-24 LAB — CERVICOVAGINAL ANCILLARY ONLY
Bacterial vaginitis: POSITIVE — AB
Candida vaginitis: NEGATIVE
Chlamydia: NEGATIVE
Neisseria Gonorrhea: NEGATIVE
Trichomonas: NEGATIVE

## 2019-01-24 LAB — TSH: TSH: 2.53 u[IU]/mL (ref 0.450–4.500)

## 2019-01-25 MED ORDER — METRONIDAZOLE 500 MG PO TABS: 500.0000 mg | ORAL_TABLET | Freq: Two times a day (BID) | ORAL | 0 refills | Status: DC

## 2019-01-25 NOTE — Addendum Note (Signed)
Addended by: Leroy Libman on: 01/25/2019 01:45 AM   Modules accepted: Orders

## 2019-01-31 LAB — CYTOLOGY - PAP
Diagnosis: NEGATIVE
HPV 16/18/45 genotyping: POSITIVE — AB
HPV: DETECTED — AB

## 2019-02-04 ENCOUNTER — Telehealth (INDEPENDENT_AMBULATORY_CARE_PROVIDER_SITE_OTHER): Payer: Self-pay | Admitting: Lactation Services

## 2019-02-04 ENCOUNTER — Other Ambulatory Visit: Payer: Self-pay

## 2019-02-04 ENCOUNTER — Ambulatory Visit (HOSPITAL_COMMUNITY)
Admission: RE | Admit: 2019-02-04 | Discharge: 2019-02-04 | Disposition: A | Discharge status: Home / Self Care | Payer: Self-pay | Source: Ambulatory Visit | Attending: Obstetrics & Gynecology | Admitting: Obstetrics & Gynecology

## 2019-02-04 ENCOUNTER — Encounter: Payer: Self-pay | Admitting: Family Medicine

## 2019-02-04 DIAGNOSIS — A630 Anogenital (venereal) warts: Secondary | ICD-10-CM

## 2019-02-04 DIAGNOSIS — N924 Excessive bleeding in the premenopausal period: Secondary | ICD-10-CM | POA: Insufficient documentation

## 2019-02-04 NOTE — Telephone Encounter (Signed)
Called and spoke with pt in regards to lab results. 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 Pt informed it is recommended that she come in for a Colposcopy. Pt voiced she has had one in the past around 2012-2013 in [place]. Pt voiced understanding to need for procedure. She had no questions/concerns at this time. Pt aware front office to call and schedule her for Colposcopy.

## 2019-02-04 NOTE — Telephone Encounter (Signed)
-----   Message from Conan Bowens, MD sent at 02/02/2019  1:08 AM EDT ----- +hr HPV, needs colposcopy

## 2019-02-11 ENCOUNTER — Encounter: Payer: Self-pay | Admitting: Family Medicine

## 2019-02-13 ENCOUNTER — Encounter: Payer: Self-pay | Admitting: Family Medicine

## 2019-02-15 ENCOUNTER — Encounter: Payer: Self-pay | Admitting: Family Medicine

## 2019-02-18 NOTE — Telephone Encounter (Signed)
Please refill if appropriate

## 2019-02-20 ENCOUNTER — Ambulatory Visit: Payer: Medicaid Other | Admitting: Obstetrics and Gynecology

## 2019-02-21 ENCOUNTER — Other Ambulatory Visit (HOSPITAL_COMMUNITY): Payer: Self-pay | Admitting: *Deleted

## 2019-02-21 DIAGNOSIS — Z1231 Encounter for screening mammogram for malignant neoplasm of breast: Secondary | ICD-10-CM

## 2019-03-14 ENCOUNTER — Ambulatory Visit (HOSPITAL_COMMUNITY): Payer: Medicaid Other

## 2019-03-18 ENCOUNTER — Telehealth: Payer: Self-pay | Admitting: Licensed Clinical Social Worker

## 2019-03-18 NOTE — Telephone Encounter (Signed)
Call placed to patient to follow up on consult. LCSW received following message on both phone numbers in chart: Caller can not receive calls at this time  LCSW was unable to leave return message

## 2019-03-19 ENCOUNTER — Other Ambulatory Visit: Payer: Self-pay | Admitting: Obstetrics and Gynecology

## 2019-03-19 NOTE — Progress Notes (Signed)
error 

## 2019-03-20 ENCOUNTER — Ambulatory Visit: Payer: Medicaid Other | Admitting: Obstetrics and Gynecology

## 2019-03-20 ENCOUNTER — Other Ambulatory Visit: Payer: Self-pay | Admitting: Obstetrics and Gynecology

## 2019-03-20 MED ORDER — MEDROXYPROGESTERONE ACETATE 10 MG PO TABS: 20.0000 mg | ORAL_TABLET | Freq: Every day | ORAL | 2 refills | Status: DC

## 2019-03-20 NOTE — Progress Notes (Signed)
Provera sent to pharmacy at patient request

## 2019-04-09 ENCOUNTER — Encounter: Payer: Self-pay | Admitting: Family Medicine

## 2019-04-09 DIAGNOSIS — D509 Iron deficiency anemia, unspecified: Secondary | ICD-10-CM

## 2019-04-09 NOTE — Telephone Encounter (Signed)
Refill is appropriate please

## 2019-04-10 MED ORDER — FERROUS SULFATE 325 (65 FE) MG PO TABS: 325.0000 mg | ORAL_TABLET | Freq: Two times a day (BID) | ORAL | 0 refills | Status: DC

## 2019-04-16 ENCOUNTER — Ambulatory Visit (HOSPITAL_COMMUNITY)
Admission: RE | Admit: 2019-04-16 | Discharge: 2019-04-16 | Disposition: A | Discharge status: Home / Self Care | Payer: Medicaid Other | Source: Ambulatory Visit | Attending: Obstetrics and Gynecology | Admitting: Obstetrics and Gynecology

## 2019-04-16 ENCOUNTER — Encounter (HOSPITAL_COMMUNITY): Payer: Self-pay

## 2019-04-16 ENCOUNTER — Ambulatory Visit
Admission: RE | Admit: 2019-04-16 | Discharge: 2019-04-16 | Disposition: A | Discharge status: Home / Self Care | Payer: No Typology Code available for payment source | Source: Ambulatory Visit | Attending: Obstetrics and Gynecology | Admitting: Obstetrics and Gynecology

## 2019-04-16 ENCOUNTER — Other Ambulatory Visit: Payer: Self-pay

## 2019-04-16 DIAGNOSIS — Z1231 Encounter for screening mammogram for malignant neoplasm of breast: Secondary | ICD-10-CM

## 2019-04-16 DIAGNOSIS — Z1239 Encounter for other screening for malignant neoplasm of breast: Secondary | ICD-10-CM | POA: Insufficient documentation

## 2019-04-16 NOTE — Patient Instructions (Signed)
Explained breast self awareness with [name]. Patient did not need a Pap smear today due to last Pap smear and HPV typing was 01/23/2019. 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 Referred patient to the [facility] for a screening mammogram. Appointment scheduled for Tuesday, April 16, 2019 at 1240. Patient aware of appointment and will be there. Let patient know the [facility] will follow up with her within the next couple weeks with results of mammogram by letter or phone. Kaylee [name] verbalized understanding.  Kaylee [name], Kaylee Chaco, RN 10:54 AM

## 2019-04-16 NOTE — Progress Notes (Signed)
No complaints today.   Pap Smear: Pap smear not completed today. Last Pap smear was 01/23/2019 at the [facility] and normal with positive HPV. Per patient has a history of an abnormal Pap smear 3 years ago that a repeat Pap smear was completed for follow-up that was normal. Last Pap smear result is in Epic.  Physical exam: Breasts Breasts symmetrical. No skin abnormalities bilateral breasts. No nipple retraction bilateral breasts. No nipple discharge bilateral breasts. No lymphadenopathy. No lumps palpated bilateral breasts. No complaints of pain or tenderness on exam. Referred patient to the [facility] for a screening mammogram. Appointment scheduled for Tuesday, April 16, 2019 at 1240.        Pelvic/Bimanual No Pap smear completed today since last Pap smear and HPV typing was 01/23/2019. Pap smear not indicated per BCCCP guidelines.   Smoking History: Patient has never smoked.  Patient Navigation: Patient education provided. Access to services provided for patient through BCCCP program.   Breast and Cervical Cancer Risk Assessment: Patient has a family history of her maternal grandmother having breast cancer. Patient has no known genetic mutations or history of radiation treatment to the chest before age 43. Patient has no history of cervical dysplasia, immunocompromised, or DES exposure in-utero.  Risk Assessment    Risk Scores      04/16/2019   Last edited by: Armond Hang, LPN   5-year risk: 1 %   Lifetime risk: 8.9 %

## 2019-04-22 ENCOUNTER — Encounter (HOSPITAL_COMMUNITY): Payer: Self-pay | Admitting: *Deleted

## 2019-05-03 ENCOUNTER — Encounter: Payer: Self-pay | Admitting: Family Medicine

## 2019-05-11 ENCOUNTER — Encounter: Payer: Self-pay | Admitting: Family Medicine

## 2019-06-04 ENCOUNTER — Other Ambulatory Visit: Payer: Self-pay | Admitting: Family Medicine

## 2019-06-04 DIAGNOSIS — I10 Essential (primary) hypertension: Secondary | ICD-10-CM

## 2019-07-06 ENCOUNTER — Encounter: Payer: Self-pay | Admitting: Family Medicine

## 2019-07-12 ENCOUNTER — Encounter: Payer: Self-pay | Admitting: Family Medicine

## 2019-07-15 ENCOUNTER — Other Ambulatory Visit: Payer: Self-pay | Admitting: Family Medicine

## 2019-07-15 ENCOUNTER — Encounter: Payer: Self-pay | Admitting: Family Medicine

## 2019-07-15 ENCOUNTER — Other Ambulatory Visit: Payer: Self-pay | Admitting: Obstetrics and Gynecology

## 2019-07-15 DIAGNOSIS — I10 Essential (primary) hypertension: Secondary | ICD-10-CM

## 2019-07-15 MED ORDER — HYDROCHLOROTHIAZIDE 12.5 MG PO CAPS: 12.5000 mg | ORAL_CAPSULE | Freq: Every day | ORAL | 0 refills | Status: DC

## 2019-07-15 NOTE — Progress Notes (Signed)
Patient ID: Kaylee [name], female   DOB: 12/25/1970, 48 y.o.   MRN: [id number]   My chart message received from patient requesting refill of HCTZ. She was last seen in the office in May of this year. I will send in 30 day supply and patient can schedule follow-up.

## 2019-08-19 ENCOUNTER — Encounter: Payer: Self-pay | Admitting: Family Medicine

## 2019-08-22 ENCOUNTER — Other Ambulatory Visit: Payer: Self-pay

## 2019-08-22 ENCOUNTER — Encounter: Payer: Self-pay | Admitting: Family Medicine

## 2019-08-22 ENCOUNTER — Ambulatory Visit: Payer: No Typology Code available for payment source | Attending: Family Medicine | Admitting: Family Medicine

## 2019-08-22 DIAGNOSIS — D509 Iron deficiency anemia, unspecified: Secondary | ICD-10-CM

## 2019-08-22 DIAGNOSIS — Z79899 Other long term (current) drug therapy: Secondary | ICD-10-CM

## 2019-08-22 DIAGNOSIS — N3000 Acute cystitis without hematuria: Secondary | ICD-10-CM

## 2019-08-22 DIAGNOSIS — J4540 Moderate persistent asthma, uncomplicated: Secondary | ICD-10-CM

## 2019-08-22 DIAGNOSIS — I10 Essential (primary) hypertension: Secondary | ICD-10-CM

## 2019-08-22 DIAGNOSIS — R0602 Shortness of breath: Secondary | ICD-10-CM

## 2019-08-22 DIAGNOSIS — N920 Excessive and frequent menstruation with regular cycle: Secondary | ICD-10-CM

## 2019-08-22 DIAGNOSIS — R300 Dysuria: Secondary | ICD-10-CM

## 2019-08-22 DIAGNOSIS — Z862 Personal history of diseases of the blood and blood-forming organs and certain disorders involving the immune mechanism: Secondary | ICD-10-CM

## 2019-08-22 DIAGNOSIS — R350 Frequency of micturition: Secondary | ICD-10-CM

## 2019-08-22 MED ORDER — MONTELUKAST SODIUM 10 MG PO TABS: 10.0000 mg | ORAL_TABLET | Freq: Every day | ORAL | 3 refills | Status: DC

## 2019-08-22 MED ORDER — HYDROCHLOROTHIAZIDE 12.5 MG PO CAPS: 12.5000 mg | ORAL_CAPSULE | Freq: Every day | ORAL | 0 refills | Status: DC

## 2019-08-22 MED ORDER — POTASSIUM CHLORIDE ER 10 MEQ PO TBCR: 10.0000 meq | EXTENDED_RELEASE_TABLET | Freq: Every day | ORAL | 0 refills | Status: DC

## 2019-08-22 MED ORDER — FERROUS SULFATE 325 (65 FE) MG PO TABS: 325.0000 mg | ORAL_TABLET | Freq: Two times a day (BID) | ORAL | 0 refills | Status: DC

## 2019-08-22 NOTE — Progress Notes (Signed)
Virtual Visit via Telephone Note  I connected with Kaylee [name] on 08/22/19 at  4:10 PM EST by telephone and verified that I am speaking with the correct person using two identifiers.   I discussed the limitations, risks, security and privacy concerns of performing an evaluation and management service by telephone and the availability of in person appointments. I also discussed with the patient that there may be a patient responsible charge related to this service. The patient expressed understanding and agreed to proceed.  Patient Location: Home Provider Location: [facility] Others participating in call: none   History of Present Illness:      48 yo female with complaint of urinary frequency especially at night as well as urgency and she has been feeling as if she is having more shortness of breath with mild activity such as walking. She gets the sensation of her heart pounding if she walks or goes up and down stairs. She does get some swelling  in her legs. Patient says that she was supposed to see a heart specialist but thinks that she missed her appointment.  She occasionally feels as if she reports shortness of breath when she is lying flat and will use extra pillows.  She has been taking hydrochlorothiazide along with use of potassium.  She does have some lower extremity swelling but this is improved with use of medication.  She has noticed that if she sits down with her legs crossed for a few hours then she will have more swelling in the left leg but this does go away.  She does not feel as if she is having any headaches or dizziness related to her blood pressure.  No muscle cramping related to use of diuretic medication.         She also has asthma.  She was on the pill in the past that did help with her asthma control.  She is not sure if asthma is contributing to her current issues with shortness of breath.  She would like a refill of iron as she has been anemic in the past.  She does  have some issues with nighttime awakening with shortness of breath and sensation of chest tightness and occasional wheezing.  She uses her albuterol inhaler on an as-needed basis and not daily.  She denies any current cough.  No current sore throat, but has occasional nasal congestion.          No current abdominal or pelvic pain.  No chest pain.  No nausea/vomiting/diarrhea constipation.  She has had increased fatigue.  No blood in the stool or black stools.   Past Medical History:  Diagnosis Date  . Heavy menstrual period   . Morbid obesity (HCC)     Past Surgical History:  Procedure Laterality Date  . s/p of leep cervix      Family History  Problem Relation Age of Onset  . Leukemia Mother   . Breast cancer Maternal Grandmother 30    Social History   Tobacco Use  . Smoking status: Never Smoker  . Smokeless tobacco: Never Used  Substance Use Topics  . Alcohol use: Not Currently  . Drug use: Not Currently     No Known Allergies     Observations/Objective: No vital signs or physical exam conducted as visit was done via telephone  Assessment and Plan: 1. Essential hypertension Currently on  hydrochlorothiazide 12.5 mg daily and potassium chloride 10 mEq daily..  Low-sodium diet and regular monitoring of blood  pressure encouraged.  As patient is having complaint of sensation of shortness of breath, occasional leg swelling and increased heart rate, she will be referred to cardiology for further evaluation and treatment.  She has been asked to come into clinic for lab visit to check electrolytes/renal function.  Will obtain hemoglobin A1c as patient is on hydrochlorothiazide which can increase risk of becoming diabetic.  Refills provided of hydrochlorothiazide and potassium chloride 10 mEq daily.- Ambulatory referral to Cardiology - Basic Metabolic Panel; Future - HgB A1c  2. Shortness of breath Cardiology referral placed due to patient's complaint of shortness of breath, lower  extremity edema and history of hypertension.  Patient has been asked to come in to have basic metabolic panel to check electrolytes as well as CBC look for anemia as a cause of her shortness of breath.  She will also have BNP to look for elevation which may represent heart failure/CHF exacerbation as the cause of patient's symptoms. - Ambulatory referral to Cardiology - Basic Metabolic Panel; Future - Brain natriuretic peptide; Future - CBC  3. Moderate persistent asthma without complication Prescription provided for Singulair which patient states that she is used in the past to help with control of asthma as patient with daily symptoms.  Patient does not wish to try other medications such as Advair at this time. - montelukast (SINGULAIR) 10 MG tablet; Take 1 tablet (10 mg total) by mouth at bedtime.  Dispense: 30 tablet; Refill: 3  4. Urinary frequency Patient was encouraged to come in for lab visit for urinalysis and urine culture and follow-up of urinary frequency.  She will also have BMP to look for elevated blood sugar and hemoglobin A1c to look for possible prediabetes/diabetes as a cause of her issues with urinary frequency. - Urinalysis; Future - Urine Culture; Future - HgB A1c  5. Dysuria Patient with complaint of dysuria and urinary frequency which may indicate presence of urinary tract infection.  Patient has been asked to make lab visit to have urinalysis and urine culture. - Urinalysis; Future - Urine Culture; Future  6. History of anemia Patient with history of anemia now with complaint of fatigue, shortness of breath and some lower extremity edema.  She will come in to have CBC in follow-up of history of anemia. - CBC  7. Encounter for long-term current use of medication Patient will have hemoglobin A1c and basic metabolic panel done in follow-up of long-term use of medications for treatment of hypertension. - HgB A1c  8. Menorrhagia with regular cycle Patient with  menorrhagia with regular cycle which was caused past issues with anemia.  Patient will have CBC at upcoming lab visit.  Refill provided for ferrous sulfate per patient's request but she will be notified of her CBC results and recommendations based on the results. - CBC - ferrous sulfate 325 (65 FE) MG tablet; Take 1 tablet (325 mg total) by mouth 2 (two) times daily with a meal.  Dispense: 180 tablet; Refill: 0   9.  Acute cystitis Patient with abnormal urinalysis and urine will be sent for culture.  In the meantime, she is being treated with Augmentin 500 mg twice daily x7 days which she should take after eating.  She will be notified if additional antibiotic therapy or change in therapy is needed based on the results.  Remain well-hydrated with water.  Follow Up Instructions:Return in about 4 months (around 12/21/2019) for Chronic issues; 1 to 2 weeks if not feeling better.    I discussed the  assessment and treatment plan with the patient. The patient was provided an opportunity to ask questions and all were answered. The patient agreed with the plan and demonstrated an understanding of the instructions.   The patient was advised to call back or seek an in-person evaluation if the symptoms worsen or if the condition fails to improve as anticipated.  I provided 16 minutes of non-face-to-face time during this encounter. Additional 15 minutes spent on reviewing chart, labs, prior imaging and placing orders/referrals.   Antony Blackbird, MD

## 2019-08-22 NOTE — Progress Notes (Signed)
Patient verified DOB Patient has eaten today. Patient has taken medication today. Patient denies pain at this time. Patient request refill on iron supplements.

## 2019-08-23 ENCOUNTER — Ambulatory Visit: Payer: Self-pay | Attending: Family Medicine

## 2019-08-23 DIAGNOSIS — R350 Frequency of micturition: Secondary | ICD-10-CM

## 2019-08-23 DIAGNOSIS — R0602 Shortness of breath: Secondary | ICD-10-CM

## 2019-08-23 DIAGNOSIS — I10 Essential (primary) hypertension: Secondary | ICD-10-CM

## 2019-08-23 DIAGNOSIS — R300 Dysuria: Secondary | ICD-10-CM

## 2019-08-24 ENCOUNTER — Encounter: Payer: Self-pay | Admitting: Family Medicine

## 2019-08-24 LAB — BASIC METABOLIC PANEL WITH GFR
BUN/Creatinine Ratio: 9 (ref 9–23)
BUN: 6 mg/dL (ref 6–24)
CO2: 23 mmol/L (ref 20–29)
Calcium: 11 mg/dL — ABNORMAL HIGH (ref 8.7–10.2)
Chloride: 100 mmol/L (ref 96–106)
Creatinine, Ser: 0.64 mg/dL (ref 0.57–1.00)
GFR calc Af Amer: 122 mL/min/1.73
GFR calc non Af Amer: 106 mL/min/1.73
Glucose: 255 mg/dL — ABNORMAL HIGH (ref 65–99)
Potassium: 4.7 mmol/L (ref 3.5–5.2)
Sodium: 135 mmol/L (ref 134–144)

## 2019-08-24 LAB — BRAIN NATRIURETIC PEPTIDE: BNP: 5.7 pg/mL (ref 0.0–100.0)

## 2019-08-24 LAB — URINALYSIS

## 2019-08-26 ENCOUNTER — Encounter: Payer: Self-pay | Admitting: Family Medicine

## 2019-08-26 LAB — URINE CULTURE

## 2019-08-26 MED ORDER — FLUCONAZOLE 150 MG PO TABS: 150.0000 mg | ORAL_TABLET | Freq: Once | ORAL | 1 refills | Status: AC

## 2019-08-26 MED ORDER — AMOXICILLIN-POT CLAVULANATE 500-125 MG PO TABS: 1.0000 | ORAL_TABLET | Freq: Two times a day (BID) | ORAL | 0 refills | Status: AC

## 2019-08-26 NOTE — Progress Notes (Signed)
Please notify patient that her urine culture showed the growth of bacteria consistent with a urinary tract infection and that a has been sent to her pharmacy to which the bacteria is sensitive.  Prescription also sent to [facility] for Diflucan in case of yeast infection after antibiotic use.  Please schedule follow-up if symptoms do not resolve after antibiotic treatment

## 2019-08-27 ENCOUNTER — Encounter: Payer: Self-pay | Admitting: Family Medicine

## 2019-08-29 ENCOUNTER — Encounter: Payer: Self-pay | Admitting: Family Medicine

## 2019-09-02 ENCOUNTER — Other Ambulatory Visit: Payer: Self-pay

## 2019-09-02 ENCOUNTER — Ambulatory Visit: Payer: Medicaid Other | Attending: Family Medicine

## 2019-09-02 DIAGNOSIS — I10 Essential (primary) hypertension: Secondary | ICD-10-CM

## 2019-09-02 DIAGNOSIS — R350 Frequency of micturition: Secondary | ICD-10-CM

## 2019-09-03 ENCOUNTER — Other Ambulatory Visit: Payer: Self-pay | Admitting: Nurse Practitioner

## 2019-09-03 ENCOUNTER — Other Ambulatory Visit: Payer: Self-pay | Admitting: Pharmacist

## 2019-09-03 ENCOUNTER — Other Ambulatory Visit: Payer: Self-pay | Admitting: Family Medicine

## 2019-09-03 ENCOUNTER — Encounter: Payer: Self-pay | Admitting: Family Medicine

## 2019-09-03 ENCOUNTER — Telehealth: Payer: Self-pay

## 2019-09-03 DIAGNOSIS — E1165 Type 2 diabetes mellitus with hyperglycemia: Secondary | ICD-10-CM

## 2019-09-03 DIAGNOSIS — E119 Type 2 diabetes mellitus without complications: Secondary | ICD-10-CM

## 2019-09-03 LAB — CBC
Hematocrit: 47.6 % — ABNORMAL HIGH (ref 34.0–46.6)
Hemoglobin: 15.9 g/dL (ref 11.1–15.9)
MCH: 30.6 pg (ref 26.6–33.0)
MCHC: 33.4 g/dL (ref 31.5–35.7)
MCV: 92 fL (ref 79–97)
Platelets: 290 x10E3/uL (ref 150–450)
RBC: 5.2 x10E6/uL (ref 3.77–5.28)
RDW: 11.8 % (ref 11.7–15.4)
WBC: 10.4 x10E3/uL (ref 3.4–10.8)

## 2019-09-03 LAB — BASIC METABOLIC PANEL WITH GFR
BUN/Creatinine Ratio: 13 (ref 9–23)
BUN: 8 mg/dL (ref 6–24)
CO2: 22 mmol/L (ref 20–29)
Calcium: 11 mg/dL — ABNORMAL HIGH (ref 8.7–10.2)
Chloride: 102 mmol/L (ref 96–106)
Creatinine, Ser: 0.6 mg/dL (ref 0.57–1.00)
GFR calc Af Amer: 125 mL/min/1.73
GFR calc non Af Amer: 108 mL/min/1.73
Glucose: 268 mg/dL — ABNORMAL HIGH (ref 65–99)
Potassium: 4.8 mmol/L (ref 3.5–5.2)
Sodium: 136 mmol/L (ref 134–144)

## 2019-09-03 LAB — HEMOGLOBIN A1C
Est. average glucose Bld gHb Est-mCnc: 235 mg/dL
Hgb A1c MFr Bld: 9.8 % — ABNORMAL HIGH (ref 4.8–5.6)

## 2019-09-03 MED ORDER — TRUE METRIX BLOOD GLUCOSE TEST VI STRP: ORAL_STRIP | 11 refills | Status: DC

## 2019-09-03 MED ORDER — ACCU-CHEK GUIDE CONTROL VI LIQD: 1.0000 | Freq: Once | 0 refills | Status: DC | PRN

## 2019-09-03 MED ORDER — ATORVASTATIN CALCIUM 20 MG PO TABS: 20.0000 mg | ORAL_TABLET | Freq: Every day | ORAL | 3 refills | Status: DC

## 2019-09-03 MED ORDER — TRUEPLUS LANCETS 28G MISC: 11 refills | Status: DC

## 2019-09-03 MED ORDER — METFORMIN HCL 500 MG PO TABS: 500.0000 mg | ORAL_TABLET | Freq: Two times a day (BID) | ORAL | 3 refills | Status: DC

## 2019-09-03 MED ORDER — LISINOPRIL 2.5 MG PO TABS: 2.5000 mg | ORAL_TABLET | Freq: Every day | ORAL | 2 refills | Status: DC

## 2019-09-03 MED ORDER — TRUE METRIX METER W/DEVICE KIT: PACK | 0 refills | Status: DC

## 2019-09-03 MED ORDER — ACCU-CHEK FASTCLIX LANCETS MISC: 3 refills | Status: DC

## 2019-09-03 MED ORDER — ACCU-CHEK FASTCLIX LANCET KIT: 1.0000 | PACK | Freq: Once | 0 refills | Status: AC

## 2019-09-03 MED ORDER — ACCU-CHEK GUIDE VI STRP: ORAL_STRIP | 12 refills | Status: DC

## 2019-09-03 MED ORDER — ACCU-CHEK GUIDE ME W/DEVICE KIT: 1.0000 | PACK | Freq: Once | 0 refills | Status: DC

## 2019-09-03 NOTE — Telephone Encounter (Signed)
Pt sent a MyChart message regarding A1C. Please respond

## 2019-09-03 NOTE — Progress Notes (Signed)
Patient ID: Kaylee [name], female   DOB: 1970/11/03, 48 y.o.   MRN: [id number]   48 yo female with recent Hgb A1c of  9.8 indicating a new diagnosis of diabetes. Referral will be placed for patient to be seen by endocrinology and covering provider has placed patient on metformin.

## 2019-09-03 NOTE — Telephone Encounter (Signed)
Your primary care provider is out until tomorrow 09/04/2019.  I am covering for today.  It appears your A1c is showing diabetes type 2.    Normally at a level of 9 or greater insulin is started however I will defer that option to your primary care provider.  At this time I will send metformin to your pharmacy.  You should start off taking 1 tablet twice a day and after 1 week if able to tolerate will need to increase this to 2 tablets twice a day with meals.  Your primary care provider will contact you if she decides to add any additional medications.  Diabetes type 2 can be controlled and sometimes possibly reversed into a state in which you do not have to take medications with strict diet and exercise.  I am also referring you to a nutritionist/dietitian for dietary recommendations.  Walking is a great way to start an exercise regimen of 30 to 45 minutes 3-5 times per week.   Initially you should be checking your blood sugars 3 times a day.  Once in the morning before you eat: a reasonable range to look for will be 90-130.  An hour after a meal an acceptable range would be less than 180.  If your blood glucose levels are higher than these averages that usually means that the foods you are eating are causing your diabetes to not be controlled.  Some types of foods that can cause elevated glucose readings are: White rice, white potatoes, white pasta, white flour or any foods that are fried in white flour, white sugar, sugary drinks such as soft drinks and fruit drinks, white dough or foods made out of white dough including pizzas. This is just a starter list. Your dietician can go over a more in-depth nutrition plan/meals with you.   If you have any additional questions you can message me on mychart today. Dr. Chapman Fitch will return tomorrow and you can contact her was well after today.

## 2019-09-04 ENCOUNTER — Encounter: Payer: Self-pay | Admitting: Family Medicine

## 2019-09-06 NOTE — Progress Notes (Signed)
Cardiology Office Note:   Date:  09/09/2019  NAME:  Kaylee [name]    MRN: [id number] DOB:  06/27/1971   PCP:  Antony Blackbird, MD  Cardiologist:  Evalina Field, MD   Referring MD: Antony Blackbird, MD   Chief Complaint  Patient presents with  . Shortness of Breath   History of Present Illness:   Kaylee [name] is a 49 y.o. female with a hx of morbid obesity, HTN, diabetes who is being seen today for the evaluation of shortness of breath at the request of Fulp, Cammie, MD.  She reports worsening shortness of breath with exercise since February 2020.  She reports her symptoms started when she begins to exert herself.  Activity such as climbing stairs or doing routine things have gotten bothersome for her.  She was hospitalized in February 2020 and diagnosed with heart failure with preserved ejection fraction.  She had a chest x-ray that showed mild vascular congestion.  She is morbidly obese with a BMI of 67.  She also has been recently diagnosed with diabetes.  She denies any exertional chest pain.  She does report some lower extremity edema but states that her legs are mostly the same size.  There is a question of sleep apnea but she is never been tested.  She does have a history of heavy menstrual periods and anemia, but recent lab work shows she is not anemic.  On review of the records from her hospitalization in February she had a normal BNP, and a chest x-ray with mild vascular congestion.  Her echocardiogram demonstrated normal left ventricular ejection fraction with normal diastolic function.  She also had normal RV function and no evidence of pulmonary hypertension on that study.  Her cardiovascular disease risk factors include hyperlipidemia, diabetes, hypertension, morbid obesity.  She does not smoke and does not consume alcohol.  No illicit drug use.  Recent lab work from PCP: BNP 5.7, Cr 0.64, A1c 9.8, T chol 133, HDL 26, LDL 88  Past Medical History: Past Medical History:  Diagnosis  Date  . Diabetes mellitus without complication ([place])   . Heavy menstrual period   . Morbid obesity ([place])     Past Surgical History: Past Surgical History:  Procedure Laterality Date  . s/p of leep cervix      Current Medications: No outpatient medications have been marked as taking for the 09/09/19 encounter (Office Visit) with Geralynn Rile, MD.     Allergies:    Patient has no known allergies.   Social History: Social History   Socioeconomic History  . Marital status: Married    Spouse name: Not on file  . Number of children: 4  . Years of education: Not on file  . Highest education level: 12th grade  Occupational History  . Not on file  Tobacco Use  . Smoking status: Never Smoker  . Smokeless tobacco: Never Used  Substance and Sexual Activity  . Alcohol use: Not Currently  . Drug use: Not Currently  . Sexual activity: Not Currently  Other Topics Concern  . Not on file  Social History Narrative  . Not on file   Social Determinants of Health   Financial Resource Strain:   . Difficulty of Paying Living Expenses: Not on file  Food Insecurity:   . Worried About Charity fundraiser in the Last Year: Not on file  . Ran Out of Food in the Last Year: Not on file  Transportation Needs: Unmet Transportation Needs  .  Lack of Transportation (Medical): Yes  . Lack of Transportation (Non-Medical): Yes  Physical Activity:   . Days of Exercise per Week: Not on file  . Minutes of Exercise per Session: Not on file  Stress:   . Feeling of Stress : Not on file  Social Connections:   . Frequency of Communication with Friends and Family: Not on file  . Frequency of Social Gatherings with Friends and Family: Not on file  . Attends Religious Services: Not on file  . Active Member of Clubs or Organizations: Not on file  . Attends Banker Meetings: Not on file  . Marital Status: Not on file    Family History: The patient's family history includes Breast  cancer (age of onset: 11) in her maternal grandmother; Leukemia in her mother.  ROS:   All other ROS reviewed and negative. Pertinent positives noted in the HPI.     EKGs/Labs/Other Studies Reviewed:   The following studies were personally reviewed by me today:  EKG:  EKG is ordered today.  The ekg ordered today demonstrates sinus tachycardia, heart rate 128, anterior Q waves likely related to obesity, no acute ST-T changes, normal intervals, and was personally reviewed by me.   TTE 10/13/2018  1. The left ventricle has normal systolic function of 60-65%. The cavity size was normal. There is no increased left ventricular wall thickness. Echo evidence of normal diastolic relaxation.  2. The right ventricle has normal systolic function. The cavity was normal. There is no increase in right ventricular wall thickness.  3. The mitral valve is normal in structure. No evidence of mitral valve stenosis.  4. The tricuspid valve is normal in structure.  5. The aortic valve has an indeterminant number of cusps.  6. The aortic root is normal in size and structure.  7. No evidence of left ventricular regional wall motion abnormalities.  Recent Labs: 10/12/2018: ALT 11 01/23/2019: TSH 2.530 08/23/2019: BNP 5.7 09/02/2019: BUN 8; Creatinine, Ser 0.60; Hemoglobin 15.9; Platelets 290; Potassium 4.8; Sodium 136   Recent Lipid Panel    Component Value Date/Time   CHOL 133 10/13/2018 0311   TRIG 94 10/13/2018 0311   HDL 26 (L) 10/13/2018 0311   CHOLHDL 5.1 10/13/2018 0311   VLDL 19 10/13/2018 0311   LDLCALC 88 10/13/2018 0311    Physical Exam:   VS:  BP (!) 150/80 (BP Location: Right Arm, Patient Position: Sitting, Cuff Size: Normal)   Pulse (!) 128   Ht 5\' 7"  (1.702 m)   Wt (!) 429 lb (194.6 kg)   SpO2 96%   BMI 67.19 kg/m    Wt Readings from Last 3 Encounters:  09/09/19 (!) 429 lb (194.6 kg)  01/23/19 (!) 391 lb (177.4 kg)  10/25/18 (!) 391 lb (177.4 kg)    General: Morbidly obese female,  no acute distress Heart: Atraumatic, normal size  Eyes: PEERLA, EOMI  Neck: Supple, no JVD Endocrine: No thryomegaly Cardiac: Tachycardia noted, no murmurs rubs or gallops Lungs: Diminished breath sounds at lung bases Abd: Soft, nontender, no hepatomegaly  Ext: Trace edema Musculoskeletal: No deformities, BUE and BLE strength normal and equal Skin: Warm and dry, no rashes   Neuro: Alert and oriented to person, place, time, and situation, CNII-XII grossly intact, no focal deficits  Psych: Normal mood and affect   ASSESSMENT:   Zeah Germano is a 49 y.o. female who presents for the following: 1. Heart failure with preserved ejection fraction, unspecified HF chronicity (HCC)   2. SOB (  shortness of breath) on exertion   3. Essential hypertension   4. Obesity, morbid, BMI 50 or higher (HCC)     PLAN:   1. Heart failure with preserved ejection fraction, unspecified HF chronicity (HCC) 2. SOB (shortness of breath) on exertion -Symptoms are concerning for heart failure with preserved ejection fraction.  She was admitted in February and had a normal BNP.  This can be falsely low in the setting of morbid obesity.  Her EKG demonstrates sinus tachycardia.  Overall, I think her symptoms are related to likely diastolic dysfunction versus morbid obesity. -We will stop her hydrochlorothiazide -We will start Lasix 20 mg daily -I will add carvedilol 25 mg twice daily -We will increase her lisinopril to 10 mg daily -She has had noticeable weight gain over the past few months.  We will check a BNP just to see if this is elevated.  It could again be falsely low. -She did have evidence of pulmonary hypertension on her chest x-ray, however her echo showed no increased pulmonary pressures in February.  For now, we will start with the above therapy and see her back in 1 month to see if there is any improvement in her symptoms. -I do wonder about a pulmonary embolism given her tachycardia and shortness of  breath however her oxygen saturations are normal.  I think we will check a D-dimer just to see if this is elevated.  If it is we can consider a CT PE study.  She is quite stable on does not appear to have alarming symptoms to suggest she needs to go to the hospital at this time.  3. Essential hypertension -Stop HCTZ.  Start carvedilol 25 mg twice daily.  Increase lisinopril to 10 mg daily, start Lasix 20 mg daily.  4. Obesity, morbid, BMI 50 or higher (HCC) -Encouraged to lose weight.  She also was encouraged to fill her diabetes medications.  This will be important moving forward.  Disposition: Return in about 1 month (around 10/10/2019).  Medication Adjustments/Labs and Tests Ordered: Current medicines are reviewed at length with the patient today.  Concerns regarding medicines are outlined above.  Orders Placed This Encounter  Procedures  . Brain natriuretic peptide  . TSH  . D-Dimer, Quantitative  . EKG 12-Lead   Meds ordered this encounter  Medications  . carvedilol (COREG) 25 MG tablet    Sig: Take 1 tablet (25 mg total) by mouth 2 (two) times daily.    Dispense:  180 tablet    Refill:  3  . furosemide (LASIX) 20 MG tablet    Sig: Take 1 tablet (20 mg total) by mouth daily.    Dispense:  90 tablet    Refill:  3  . lisinopril (ZESTRIL) 10 MG tablet    Sig: Take 1 tablet (10 mg total) by mouth daily.    Dispense:  90 tablet    Refill:  3    Patient Instructions  Medication Instructions:  Start taking 25mg  Carvedilol Twice Daily Increase Lisinopril to 10mg  Daily Start taking Lasix Daily Stop taking Hydrochlorothiazide.   If you need a refill on your cardiac medications before your next appointment, please call your pharmacy.   Lab work: BNP, TSH If you have labs (blood work) drawn today and your tests are completely normal, you will receive your results only by: MyChart Message (if you have MyChart) OR A paper copy in the mail If you have any lab test that is  abnormal or we need to  change your treatment, we will call you to review the results.  Testing/Procedures: NONE  Follow-Up: At [facility], you and your health needs are our priority.  As part of our continuing mission to provide you with exceptional heart care, we have created designated Provider Care Teams.  These Care Teams include your primary Cardiologist (physician) and Advanced Practice Providers (APPs -  Physician Assistants and Nurse Practitioners) who all work together to provide you with the care you need, when you need it. You may see Dr Flora Lipps or one of the following Advanced Practice Providers on your designated Care Team:    Azalee Course, PA-C  Micah Flesher, [place] or   Judy Pimple, [place]  Your physician wants you to follow-up in: 1 month with Dr Flora Lipps in [employer] T. Flora Lipps, MD [facility]  [address] [place], [place] [address] [phone]  09/09/2019 11:39 AM

## 2019-09-08 ENCOUNTER — Encounter: Payer: Self-pay | Admitting: Family Medicine

## 2019-09-09 ENCOUNTER — Ambulatory Visit (INDEPENDENT_AMBULATORY_CARE_PROVIDER_SITE_OTHER): Payer: No Typology Code available for payment source | Admitting: Cardiovascular Disease

## 2019-09-09 ENCOUNTER — Other Ambulatory Visit: Payer: Self-pay

## 2019-09-09 ENCOUNTER — Encounter: Payer: Self-pay | Admitting: Cardiovascular Disease

## 2019-09-09 VITALS — BP 150/80 | HR 128 | Ht 67.0 in | Wt >= 6400 oz

## 2019-09-09 DIAGNOSIS — R0602 Shortness of breath: Secondary | ICD-10-CM

## 2019-09-09 DIAGNOSIS — I5030 Unspecified diastolic (congestive) heart failure: Secondary | ICD-10-CM

## 2019-09-09 DIAGNOSIS — I10 Essential (primary) hypertension: Secondary | ICD-10-CM

## 2019-09-09 MED ORDER — LISINOPRIL 10 MG PO TABS: 10.0000 mg | ORAL_TABLET | Freq: Every day | ORAL | 3 refills | Status: DC

## 2019-09-09 MED ORDER — FUROSEMIDE 20 MG PO TABS: 20.0000 mg | ORAL_TABLET | Freq: Every day | ORAL | 3 refills | Status: DC

## 2019-09-09 MED ORDER — CARVEDILOL 25 MG PO TABS: 25.0000 mg | ORAL_TABLET | Freq: Two times a day (BID) | ORAL | 3 refills | Status: DC

## 2019-09-09 NOTE — Patient Instructions (Signed)
Medication Instructions:  Start taking 25mg  Carvedilol Twice Daily Increase Lisinopril to 10mg  Daily Start taking Lasix Daily Stop taking Hydrochlorothiazide.   If you need a refill on your cardiac medications before your next appointment, please call your pharmacy.   Lab work: BNP, TSH If you have labs (blood work) drawn today and your tests are completely normal, you will receive your results only by: MyChart Message (if you have MyChart) OR A paper copy in the mail If you have any lab test that is abnormal or we need to change your treatment, we will call you to review the results.  Testing/Procedures: NONE  Follow-Up: At [facility], you and your health needs are our priority.  As part of our continuing mission to provide you with exceptional heart care, we have created designated Provider Care Teams.  These Care Teams include your primary Cardiologist (physician) and Advanced Practice Providers (APPs -  Physician Assistants and Nurse Practitioners) who all work together to provide you with the care you need, when you need it. You may see Dr or one of the following Advanced Practice Providers on your designated Care Team:    [facility], PA-C  Flora Lipps, [place]e Course or   Micah Flesher, [place]  Your physician wants you to follow-up in: 1 month with Dr Judy Pimple in [place]

## 2019-09-10 LAB — TSH: TSH: 2.97 u[IU]/mL (ref 0.450–4.500)

## 2019-09-10 LAB — BRAIN NATRIURETIC PEPTIDE: BNP: 4.2 pg/mL (ref 0.0–100.0)

## 2019-09-10 LAB — D-DIMER, QUANTITATIVE: D-DIMER: 0.41 mg{FEU}/L (ref 0.00–0.49)

## 2019-09-12 ENCOUNTER — Encounter: Payer: Self-pay | Admitting: Family Medicine

## 2019-09-13 ENCOUNTER — Encounter: Payer: Self-pay | Admitting: Family Medicine

## 2019-09-18 ENCOUNTER — Encounter: Payer: Self-pay | Admitting: Family Medicine

## 2019-09-20 ENCOUNTER — Encounter: Payer: Self-pay | Admitting: Family Medicine

## 2019-09-20 ENCOUNTER — Other Ambulatory Visit: Payer: Self-pay | Admitting: Pharmacist

## 2019-09-20 MED ORDER — METFORMIN HCL 500 MG PO TABS: 500.0000 mg | ORAL_TABLET | Freq: Two times a day (BID) | ORAL | 2 refills | Status: DC

## 2019-09-22 ENCOUNTER — Encounter: Payer: Self-pay | Admitting: Family Medicine

## 2019-09-24 ENCOUNTER — Ambulatory Visit: Payer: Medicaid Other | Admitting: Dietician

## 2019-09-25 ENCOUNTER — Encounter: Payer: Self-pay | Admitting: Family Medicine

## 2019-09-26 NOTE — Telephone Encounter (Signed)
Please advice . Thank you

## 2019-11-18 ENCOUNTER — Other Ambulatory Visit: Payer: Self-pay | Admitting: Family Medicine

## 2019-11-18 DIAGNOSIS — N920 Excessive and frequent menstruation with regular cycle: Secondary | ICD-10-CM

## 2019-11-18 DIAGNOSIS — Z862 Personal history of diseases of the blood and blood-forming organs and certain disorders involving the immune mechanism: Secondary | ICD-10-CM

## 2019-11-20 ENCOUNTER — Ambulatory Visit: Payer: Medicaid Other | Admitting: Internal Medicine

## 2019-11-20 NOTE — Progress Notes (Deleted)
Name: Kaylee [name]  MRN/ DOB: [id number], 28-Oct-1970   Age/ Sex: 49 y.o., female    PCP: Antony Blackbird, MD   Reason for Endocrinology Evaluation: Type 2 Diabetes Mellitus     Date of Initial Endocrinology Visit: 11/20/2019     PATIENT IDENTIFIER: Ms. Kaylee [name] is a 49 y.o. female with a past medical history of T2DM and HTN .The patient presented for initial endocrinology clinic visit on 11/20/2019 for consultative assistance with her diabetes management.    HPI: Ms. Phineas Douglas was    Diagnosed with T2DM *** Prior Medications tried/Intolerance: *** Currently checking blood sugars *** x / day,  before breakfast and ***.  Hypoglycemia episodes : ***               Symptoms: ***                 Frequency: ***/  Hemoglobin A1c 9.8% at initial presentation  Patient required assistance for hypoglycemia:  Patient has required hospitalization within the last 1 year from hyper or hypoglycemia:   In terms of diet, the patient ***   HOME DIABETES REGIMEN: Metformin 500 mg BID   Statin: yes ACE-I/ARB: yes Prior Diabetic Education: {Yes/No:11203}   METER DOWNLOAD SUMMARY: Date range evaluated: *** Fingerstick Blood Glucose Tests = *** Average Number Tests/Day = *** Overall Mean FS Glucose = *** Standard Deviation = ***  BG Ranges: Low = *** High = ***   Hypoglycemic Events/30 Days: BG < 50 = *** Episodes of symptomatic severe hypoglycemia = ***   DIABETIC COMPLICATIONS: Microvascular complications:   ***  Denies: CKD  Last eye exam: Completed   Macrovascular complications:   ***  Denies: CAD, PVD, CVA   PAST HISTORY: Past Medical History:  Past Medical History:  Diagnosis Date  . Diabetes mellitus without complication ([place])   . Heavy menstrual period   . Morbid obesity ([place])     Past Surgical History:  Past Surgical History:  Procedure Laterality Date  . s/p of leep cervix        Social History:  reports that she has never smoked. She has never  used smokeless tobacco. She reports previous alcohol use. She reports previous drug use. Family History:  Family History  Problem Relation Age of Onset  . Leukemia Mother   . Breast cancer Maternal Grandmother 30      HOME MEDICATIONS: Allergies as of 11/20/2019   No Known Allergies     Medication List       Accurate as of November 20, 2019  7:31 AM. If you have any questions, ask your nurse or doctor.        Accu-Chek FastClix Lancets Misc Use as instructed. Check blood glucose level by fingerstick three per day.  E11.65   TRUEplus Lancets 28G Misc Use to check blood sugar TID.   Accu-Chek Guide Control Liqd 1 each by In Vitro route once as needed for up to 1 dose. E11.65   albuterol 108 (90 Base) MCG/ACT inhaler Commonly known as: VENTOLIN HFA Inhale 2 puffs into the lungs every 6 (six) hours as needed for wheezing or shortness of breath.   atorvastatin 20 MG tablet Commonly known as: LIPITOR Take 1 tablet (20 mg total) by mouth daily.   carvedilol 25 MG tablet Commonly known as: COREG Take 1 tablet (25 mg total) by mouth 2 (two) times daily.   ferrous sulfate 325 (65 FE) MG tablet Commonly known as: FeroSul Take 1 tablet (325 mg total) by  mouth 2 (two) times daily with a meal. Must have office visit for refills   furosemide 20 MG tablet Commonly known as: LASIX Take 1 tablet (20 mg total) by mouth daily.   lisinopril 10 MG tablet Commonly known as: ZESTRIL Take 1 tablet (10 mg total) by mouth daily.   medroxyPROGESTERone 10 MG tablet Commonly known as: PROVERA TAKE 2 TABLETS (20 MG TOTAL) BY MOUTH DAILY.   meloxicam 7.5 MG tablet Commonly known as: MOBIC Take 1 tablet (7.5 mg total) by mouth daily. As needed for arthritis pain   metFORMIN 500 MG tablet Commonly known as: GLUCOPHAGE Take 1 tablet (500 mg total) by mouth 2 (two) times daily with a meal. After 1 week increase to 2 tablets or 1000 mg total by mouth 2 times per day with a meal     montelukast 10 MG tablet Commonly known as: SINGULAIR Take 1 tablet (10 mg total) by mouth at bedtime.   potassium chloride 10 MEQ tablet Commonly known as: KLOR-CON Take 1 tablet (10 mEq total) by mouth daily.   True Metrix Blood Glucose Test test strip Generic drug: glucose blood Use to check blood sugar TID.   True Metrix Meter w/Device Kit Use to check blood sugar TID.        ALLERGIES: No Known Allergies   REVIEW OF SYSTEMS: A comprehensive ROS was conducted with the patient and is negative except as per HPI and below:  ROS    OBJECTIVE:   VITAL SIGNS: There were no vitals taken for this visit.   PHYSICAL EXAM:  General: Pt appears well and is in NAD  Hydration: Well-hydrated with moist mucous membranes and good skin turgor  HEENT: Head: Unremarkable with good dentition. Oropharynx clear without exudate.  Eyes: External eye exam normal without stare, lid lag or exophthalmos.  EOM intact.  PERRL.  Neck: General: Supple without adenopathy or carotid bruits. Thyroid: Thyroid size normal.  No goiter or nodules appreciated. No thyroid bruit.  Lungs: Clear with good BS bilat with no rales, rhonchi, or wheezes  Heart: RRR with normal S1 and S2 and no gallops; no murmurs; no rub  Abdomen: Normoactive bowel sounds, soft, nontender, without masses or organomegaly palpable  Extremities:  Lower extremities - No pretibial edema. No lesions.  Skin: Normal texture and temperature to palpation. No rash noted. No Acanthosis nigricans/skin tags. No lipohypertrophy.  Neuro: MS is good with appropriate affect, pt is alert and Ox3    DM foot exam:    DATA REVIEWED:  Lab Results  Component Value Date   HGBA1C 9.8 (H) 09/02/2019   HGBA1C 5.5 10/13/2018   Lab Results  Component Value Date   LDLCALC 88 10/13/2018   CREATININE 0.60 09/02/2019   No results found for: [facility]  Lab Results  Component Value Date   CHOL 133 10/13/2018   HDL 26 (L) 10/13/2018    LDLCALC 88 10/13/2018   TRIG 94 10/13/2018   CHOLHDL 5.1 10/13/2018        ASSESSMENT / PLAN / RECOMMENDATIONS:   1) Type 2 Diabetes Mellitus, Newly Diagnosed , Without complications - Most recent A1c of *** %. Goal A1c < 7.0 %.    Plan: GENERAL:  ***  MEDICATIONS:  ***  EDUCATION / INSTRUCTIONS:  BG monitoring instructions: Patient is instructed to check her blood sugars *** times a day, ***.  Call [facility] if: BG persistently < 70 or > 300. . I reviewed the Rule of 15 for the treatment of  hypoglycemia in detail with the patient. Literature supplied.   2) Diabetic complications:   Eye: Does *** have known diabetic retinopathy.   Neuro/ Feet: Does *** have known diabetic peripheral neuropathy.  Renal: Patient does *** have known baseline CKD. She is *** on an ACEI/ARB at present.Check urine albumin/creatinine ratio yearly starting at time of diagnosis. If albuminuria is positive, treatment is geared toward better glucose, blood pressure control and use of ACE inhibitors or ARBs. Monitor electrolytes and creatinine once to twice yearly.   3) Lipids: Patient is *** on a statin.    4) Hypertension: ***  at goal of < 140/90 mmHg.       Signed electronically by: Mack Guise, MD  [facility] End[facility]  [facility] [address] [place], [place] [address] Phone: [phone] FAX: [phone]   CC: Antony Blackbird, MD [place] [address] Phone: [phone]  Fax: [phone]    Return to Endocrinology clinic as below: Future Appointments  Date Time Provider [place]  11/20/2019  8:30 AM [place] Subramaniam, Melanie Crazier, MD [facility] None

## 2019-12-18 ENCOUNTER — Other Ambulatory Visit: Payer: Self-pay | Admitting: Obstetrics & Gynecology

## 2019-12-18 DIAGNOSIS — N939 Abnormal uterine and vaginal bleeding, unspecified: Secondary | ICD-10-CM

## 2019-12-18 MED ORDER — MEDROXYPROGESTERONE ACETATE 10 MG PO TABS: 20.0000 mg | ORAL_TABLET | Freq: Every day | ORAL | 2 refills | Status: DC

## 2019-12-19 ENCOUNTER — Encounter: Payer: Self-pay | Admitting: Family Medicine

## 2019-12-23 ENCOUNTER — Other Ambulatory Visit: Payer: Self-pay | Admitting: General Practice

## 2019-12-24 ENCOUNTER — Other Ambulatory Visit: Payer: Self-pay | Admitting: Family Medicine

## 2019-12-24 DIAGNOSIS — I10 Essential (primary) hypertension: Secondary | ICD-10-CM

## 2019-12-31 ENCOUNTER — Encounter: Payer: Self-pay | Admitting: Family Medicine

## 2020-01-03 ENCOUNTER — Encounter: Payer: Self-pay | Admitting: Family Medicine

## 2020-01-15 ENCOUNTER — Ambulatory Visit: Payer: Medicaid Other | Admitting: Internal Medicine

## 2020-01-24 ENCOUNTER — Ambulatory Visit: Payer: Medicaid Other | Admitting: Obstetrics and Gynecology

## 2020-02-13 ENCOUNTER — Encounter: Payer: Self-pay | Admitting: Family Medicine

## 2020-02-14 ENCOUNTER — Other Ambulatory Visit: Payer: Self-pay | Admitting: Pharmacist

## 2020-02-14 DIAGNOSIS — E1165 Type 2 diabetes mellitus with hyperglycemia: Secondary | ICD-10-CM

## 2020-02-14 MED ORDER — TRUE METRIX METER W/DEVICE KIT: PACK | 0 refills | Status: AC

## 2020-02-17 ENCOUNTER — Other Ambulatory Visit: Payer: Self-pay | Admitting: Family Medicine

## 2020-02-17 DIAGNOSIS — I10 Essential (primary) hypertension: Secondary | ICD-10-CM

## 2020-03-01 ENCOUNTER — Other Ambulatory Visit: Payer: Self-pay

## 2020-03-01 ENCOUNTER — Emergency Department (HOSPITAL_COMMUNITY): Payer: Medicaid Other

## 2020-03-01 ENCOUNTER — Emergency Department (HOSPITAL_COMMUNITY)
Admission: EM | Admit: 2020-03-01 | Discharge: 2020-03-01 | Disposition: A | Discharge status: Home / Self Care | Payer: Medicaid Other | Attending: Emergency Medicine | Admitting: Emergency Medicine

## 2020-03-01 ENCOUNTER — Encounter (HOSPITAL_COMMUNITY): Payer: Self-pay | Admitting: Emergency Medicine

## 2020-03-01 DIAGNOSIS — W19XXXA Unspecified fall, initial encounter: Secondary | ICD-10-CM | POA: Insufficient documentation

## 2020-03-01 DIAGNOSIS — Y999 Unspecified external cause status: Secondary | ICD-10-CM | POA: Insufficient documentation

## 2020-03-01 DIAGNOSIS — Z79899 Other long term (current) drug therapy: Secondary | ICD-10-CM | POA: Insufficient documentation

## 2020-03-01 DIAGNOSIS — Y939 Activity, unspecified: Secondary | ICD-10-CM | POA: Insufficient documentation

## 2020-03-01 DIAGNOSIS — Z7984 Long term (current) use of oral hypoglycemic drugs: Secondary | ICD-10-CM | POA: Insufficient documentation

## 2020-03-01 DIAGNOSIS — E119 Type 2 diabetes mellitus without complications: Secondary | ICD-10-CM | POA: Insufficient documentation

## 2020-03-01 DIAGNOSIS — M5431 Sciatica, right side: Secondary | ICD-10-CM

## 2020-03-01 DIAGNOSIS — M5441 Lumbago with sciatica, right side: Secondary | ICD-10-CM

## 2020-03-01 DIAGNOSIS — Y929 Unspecified place or not applicable: Secondary | ICD-10-CM | POA: Insufficient documentation

## 2020-03-01 MED ORDER — IBUPROFEN 800 MG PO TABS
800.0000 mg | ORAL_TABLET | Freq: Once | ORAL | Status: AC
  Administered 2020-03-01: 800 mg via ORAL
  Filled 2020-03-01: qty 1

## 2020-03-01 MED ORDER — CYCLOBENZAPRINE HCL 10 MG PO TABS
10.0000 mg | ORAL_TABLET | Freq: Two times a day (BID) | ORAL | 0 refills | Status: DC | PRN
Start: 2020-03-01 — End: 2020-07-22

## 2020-03-01 MED ORDER — OXYCODONE-ACETAMINOPHEN 5-325 MG PO TABS
2.0000 | ORAL_TABLET | Freq: Once | ORAL | Status: AC
  Administered 2020-03-01: 2 via ORAL
  Filled 2020-03-01: qty 2

## 2020-03-01 NOTE — ED Triage Notes (Signed)
Pt slipped and fell in bathtub just over a month ago.  C/o pain to R lower back that radiates down R leg to R calf.

## 2020-03-01 NOTE — ED Notes (Signed)
Patient verbalizes understanding of discharge instructions . Opportunity for questions and answers were provided . Armband removed by staff ,Pt discharged from ED. W/C  offered at D/C  and Declined W/C at D/C and was escorted to lobby by RN.  

## 2020-03-01 NOTE — ED Provider Notes (Signed)
[facility] EMERGENCY DEPARTMENT Provider Note   CSN: [id number] Arrival date & time: 03/01/20  1307     History Chief Complaint  Patient presents with  . Back Pain  . Leg Pain    Kaylee [name] is a 49 y.o. female.  HPI    49 year old female with morbid obesity and type 2 diabetes presents today complaining of low back pain.  She states that she did fall about a month ago but that the pain did not began immediately after that.  She has had several weeks of low back pain that came and went and seemed to resolve with out intervention.  However, recently has begun radiating down her leg has become more severe.  Pain is worse when she stands and tries to walk but she does not have any weakness.  She denies any numbness, tingling, loss of bowel or bladder control.  She is not on any blood thinners. Past Medical History:  Diagnosis Date  . Diabetes mellitus without complication ([place])   . Heavy menstrual period   . Morbid obesity [facility])     Patient Active Problem List   Diagnosis Date Noted  . Screening breast examination 04/16/2019  . Primary osteoarthritis of both knees 01/17/2019  . Cardiomegaly 01/17/2019  . Atypical chest pain 10/13/2018  . Microcytic anemia 10/13/2018  . Symptomatic anemia 10/13/2018  . Menorrhagia with regular cycle 10/13/2018  . Lactic acidosis 10/13/2018    Past Surgical History:  Procedure Laterality Date  . s/p of leep cervix       OB History    Gravida  4   Para      Term      Preterm      AB      Living  4     SAB      TAB      Ectopic      Multiple      Live Births  4           Family History  Problem Relation Age of Onset  . Leukemia Mother   . Breast cancer Maternal Grandmother 30    Social History   Tobacco Use  . Smoking status: Never Smoker  . Smokeless tobacco: Never Used  Vaping Use  . Vaping Use: Never used  Substance Use Topics  . Alcohol use: Not Currently  . Drug use: Not  Currently    Home Medications Prior to Admission medications   Medication Sig Start Date End Date Taking? Authorizing Provider  Accu-Chek FastClix Lancets MISC Use as instructed. Check blood glucose level by fingerstick three per day.  E11.65 09/03/19   Gildardo Pounds, NP  albuterol (PROVENTIL HFA;VENTOLIN HFA) 108 (90 Base) MCG/ACT inhaler Inhale 2 puffs into the lungs every 6 (six) hours as needed for wheezing or shortness of breath. 10/14/18   Norval Morton, MD  atorvastatin (LIPITOR) 20 MG tablet Take 1 tablet (20 mg total) by mouth daily. 09/03/19   Gildardo Pounds, NP  Blood Glucose Calibration (ACCU-CHEK GUIDE CONTROL) LIQD 1 each by In Vitro route once as needed for up to 1 dose. E11.65 09/03/19   Gildardo Pounds, NP  Blood Glucose Monitoring Suppl (TRUE METRIX METER) w/Device KIT Use to check blood sugar TID. 02/14/20   Fulp, Cammie, MD  carvedilol (COREG) 25 MG tablet Take 1 tablet (25 mg total) by mouth 2 (two) times daily. 09/09/19 12/08/19  Geralynn Rile, MD  ferrous sulfate (FEROSUL) 325 (65  FE) MG tablet Take 1 tablet (325 mg total) by mouth 2 (two) times daily with a meal. Must have office visit for refills 11/18/19   Fulp, Cammie, MD  furosemide (LASIX) 20 MG tablet Take 1 tablet (20 mg total) by mouth daily. 09/09/19 12/08/19  O'NealCassie Freer, MD  glucose blood (TRUE METRIX BLOOD GLUCOSE TEST) test strip Use to check blood sugar TID. 09/03/19   Fulp, Cammie, MD  lisinopril (ZESTRIL) 10 MG tablet Take 1 tablet (10 mg total) by mouth daily. 09/09/19   O'NealCassie Freer, MD  medroxyPROGESTERone (PROVERA) 10 MG tablet Take 2 tablets (20 mg total) by mouth daily. 12/18/19   Lavonia Drafts, MD  meloxicam (MOBIC) 7.5 MG tablet Take 1 tablet (7.5 mg total) by mouth daily. As needed for arthritis pain 01/21/19   Fulp, Cammie, MD  metFORMIN (GLUCOPHAGE) 500 MG tablet Take 2 tablets (1,000 mg total) by mouth 2 (two) times daily with a meal. Must have office visit for  refills 02/17/20   Fulp, Cammie, MD  montelukast (SINGULAIR) 10 MG tablet Take 1 tablet (10 mg total) by mouth at bedtime. 08/22/19   Fulp, Cammie, MD  potassium chloride (KLOR-CON) 10 MEQ tablet Take 1 tablet (10 mEq total) by mouth daily. Must have office visit for refills 02/17/20   Antony Blackbird, MD  TRUEplus Lancets 28G MISC Use to check blood sugar TID. 09/03/19   Antony Blackbird, MD    Allergies    Patient has no known allergies.  Review of Systems   Review of Systems  All other systems reviewed and are negative.   Physical Exam Updated Vital Signs BP (!) 130/91 (BP Location: Left Arm)   Pulse (!) 132   Temp 97.7 F (36.5 C) (Oral)   Resp 18   Ht 1.702 m (5' 7")   Wt (!) 194 kg   SpO2 98%   BMI 66.99 kg/m   Physical Exam Vitals and nursing note reviewed.  Constitutional:      Appearance: Normal appearance. She is obese.  HENT:     Head: Normocephalic.     Right Ear: External ear normal.     Left Ear: External ear normal.     Nose: Nose normal.     Mouth/Throat:     Mouth: Mucous membranes are moist.  Eyes:     Pupils: Pupils are equal, round, and reactive to light.  Cardiovascular:     Rate and Rhythm: Normal rate and regular rhythm.     Pulses: Normal pulses.     Heart sounds: Normal heart sounds.  Pulmonary:     Effort: Pulmonary effort is normal.     Breath sounds: Normal breath sounds.  Abdominal:     General: Bowel sounds are normal.     Palpations: Abdomen is soft.  Musculoskeletal:        General: Tenderness present. Normal range of motion.     Cervical back: Normal range of motion.     Comments: Patient has some tenderness palpation in mid low back but not as high as T12  Skin:    General: Skin is warm and dry.     Capillary Refill: Capillary refill takes less than 2 seconds.  Neurological:     General: No focal deficit present.     Mental Status: She is alert.     Cranial Nerves: No cranial nerve deficit.     Sensory: No sensory deficit.      Motor: No weakness.     Coordination:  Coordination normal.     Gait: Gait normal.     Deep Tendon Reflexes: Reflexes normal.     Comments: Perineal sensation tested and normal  Psychiatric:        Mood and Affect: Mood normal.     ED Results / Procedures / Treatments   Labs (all labs ordered are listed, but only abnormal results are displayed) Labs Reviewed - No data to display  EKG EKG Interpretation  Date/Time:  _22  year old female complaining of low back pain with pain that radiates down to the lateral aspect of her right leg.  She does not have any neurological deficits.  X-rays obtained and show questionable loss of height of T12, however this does not correlate with the patient's pain.  She is treated here with 2 Percocet and ibuprofen. Final Clinical Impression(s) / ED Diagnoses Final diagnoses:  Sciatica of right side  Acute midline low back pain with right-sided sciatica    Rx / DC Orders ED Discharge Orders    None       Pattricia Boss, MD 03/01/20 1818

## 2020-03-05 ENCOUNTER — Ambulatory Visit: Payer: Medicaid Other | Admitting: Obstetrics and Gynecology

## 2020-03-26 ENCOUNTER — Other Ambulatory Visit: Payer: Self-pay | Admitting: Family Medicine

## 2020-03-26 DIAGNOSIS — J4540 Moderate persistent asthma, uncomplicated: Secondary | ICD-10-CM

## 2020-03-26 NOTE — Telephone Encounter (Signed)
Requested Prescriptions  Pending Prescriptions Disp Refills  . montelukast (SINGULAIR) 10 MG tablet [Pharmacy Med Name: MONTELUKAST SOD 10 MG TAB 10 Tablet] 90 tablet 1    Sig: TAKE 1 TABLET (10 MG TOTAL) BY MOUTH AT BEDTIME.     Pulmonology:  Leukotriene Inhibitors Passed - 03/26/2020 10:13 AM      Passed - Valid encounter within last 12 months    Recent Outpatient Visits          7 months ago Essential hypertension   [facility] Fulp, [place], [place]   1 year ago Primary osteoarthritis of both knees   [facility] Fulp, [place], [place]   1 year ago Osteoarthritis, unspecified osteoarthritis type, unspecified site   [facility] [place], [place], [place]

## 2020-03-27 ENCOUNTER — Other Ambulatory Visit: Payer: Self-pay | Admitting: Family Medicine

## 2020-03-27 DIAGNOSIS — M170 Bilateral primary osteoarthritis of knee: Secondary | ICD-10-CM

## 2020-03-27 NOTE — Telephone Encounter (Signed)
Requested medication (s) are due for refill today: Yes  Requested medication (s) are on the active medication list: Yes  Last refill:  01/21/19  Future visit scheduled: No  Notes to clinic:  Prescription has expired.    Requested Prescriptions  Pending Prescriptions Disp Refills   meloxicam (MOBIC) 7.5 MG tablet [Pharmacy Med Name: MELOXICAM 7.5 MG TABLET 7.5 Tablet] 30 tablet 1    Sig: TAKE 1 TABLET BY MOUTH DAILY. AS NEEDED FOR ARTHRITIS PAIN      Analgesics:  COX2 Inhibitors Passed - 03/27/2020  3:42 PM      Passed - HGB in normal range and within 360 days    Hemoglobin  Date Value Ref Range Status  09/02/2019 15.9 11.1 - 15.9 g/dL Final          Passed - Cr in normal range and within 360 days    Creatinine, Ser  Date Value Ref Range Status  09/02/2019 0.60 0.57 - 1.00 mg/dL Final          Passed - Patient is not pregnant      Passed - Valid encounter within last 12 months    Recent Outpatient Visits           7 months ago Essential hypertension   [facility] Fulp, [place], MD   1 year ago Primary osteoarthritis of both knees   [facility] Fulp, [place], MD   1 year ago Osteoarthritis, unspecified osteoarthritis type, unspecified site   [facility] [place], [place], [place]

## 2020-04-14 ENCOUNTER — Ambulatory Visit: Payer: Medicaid Other | Admitting: Obstetrics and Gynecology

## 2020-04-16 ENCOUNTER — Ambulatory Visit: Payer: Medicaid Other | Admitting: Family

## 2020-07-09 ENCOUNTER — Other Ambulatory Visit: Payer: Self-pay | Admitting: Family Medicine

## 2020-07-09 ENCOUNTER — Other Ambulatory Visit: Payer: Self-pay | Admitting: Cardiovascular Disease

## 2020-07-09 ENCOUNTER — Other Ambulatory Visit: Payer: Self-pay

## 2020-07-09 DIAGNOSIS — M170 Bilateral primary osteoarthritis of knee: Secondary | ICD-10-CM

## 2020-07-09 DIAGNOSIS — I10 Essential (primary) hypertension: Secondary | ICD-10-CM

## 2020-07-09 MED ORDER — FUROSEMIDE 20 MG PO TABS: 20.0000 mg | ORAL_TABLET | Freq: Every day | ORAL | 0 refills | Status: DC

## 2020-07-09 MED ORDER — CARVEDILOL 25 MG PO TABS: 25.0000 mg | ORAL_TABLET | Freq: Two times a day (BID) | ORAL | 0 refills | Status: DC

## 2020-07-15 ENCOUNTER — Other Ambulatory Visit: Payer: Self-pay | Admitting: Obstetrics and Gynecology

## 2020-07-15 DIAGNOSIS — N939 Abnormal uterine and vaginal bleeding, unspecified: Secondary | ICD-10-CM

## 2020-07-15 NOTE — Telephone Encounter (Signed)
Pt needs follow up appointment prior to further refill sbeing sent.

## 2020-07-22 ENCOUNTER — Other Ambulatory Visit: Payer: Self-pay

## 2020-07-22 ENCOUNTER — Other Ambulatory Visit: Payer: Self-pay | Admitting: Physician Assistant

## 2020-07-22 ENCOUNTER — Ambulatory Visit: Payer: Self-pay | Attending: Physician Assistant | Admitting: Physician Assistant

## 2020-07-22 ENCOUNTER — Ambulatory Visit (HOSPITAL_BASED_OUTPATIENT_CLINIC_OR_DEPARTMENT_OTHER): Payer: Medicaid Other | Admitting: Pharmacist

## 2020-07-22 ENCOUNTER — Encounter: Payer: Self-pay | Admitting: Physician Assistant

## 2020-07-22 VITALS — BP 120/78 | HR 101 | Resp 16 | Ht 67.0 in | Wt >= 6400 oz

## 2020-07-22 DIAGNOSIS — E785 Hyperlipidemia, unspecified: Secondary | ICD-10-CM

## 2020-07-22 DIAGNOSIS — E1165 Type 2 diabetes mellitus with hyperglycemia: Secondary | ICD-10-CM

## 2020-07-22 DIAGNOSIS — I10 Essential (primary) hypertension: Secondary | ICD-10-CM

## 2020-07-22 DIAGNOSIS — M62838 Other muscle spasm: Secondary | ICD-10-CM

## 2020-07-22 DIAGNOSIS — M170 Bilateral primary osteoarthritis of knee: Secondary | ICD-10-CM

## 2020-07-22 DIAGNOSIS — N920 Excessive and frequent menstruation with regular cycle: Secondary | ICD-10-CM

## 2020-07-22 DIAGNOSIS — Z23 Encounter for immunization: Secondary | ICD-10-CM

## 2020-07-22 DIAGNOSIS — J4540 Moderate persistent asthma, uncomplicated: Secondary | ICD-10-CM

## 2020-07-22 DIAGNOSIS — Z862 Personal history of diseases of the blood and blood-forming organs and certain disorders involving the immune mechanism: Secondary | ICD-10-CM

## 2020-07-22 LAB — POCT GLYCOSYLATED HEMOGLOBIN (HGB A1C): HbA1c, POC (controlled diabetic range): 6.8 % (ref 0.0–7.0)

## 2020-07-22 LAB — GLUCOSE, POCT (MANUAL RESULT ENTRY): POC Glucose: 153 mg/dl — AB (ref 70–99)

## 2020-07-22 MED ORDER — CARVEDILOL 25 MG PO TABS: 25.0000 mg | ORAL_TABLET | Freq: Two times a day (BID) | ORAL | 5 refills | Status: DC

## 2020-07-22 MED ORDER — CYCLOBENZAPRINE HCL 10 MG PO TABS: 10.0000 mg | ORAL_TABLET | Freq: Every day | ORAL | 0 refills | Status: DC

## 2020-07-22 MED ORDER — MELOXICAM 7.5 MG PO TABS: ORAL_TABLET | ORAL | 1 refills | Status: DC

## 2020-07-22 MED ORDER — ATORVASTATIN CALCIUM 20 MG PO TABS: 20.0000 mg | ORAL_TABLET | Freq: Every day | ORAL | 3 refills | Status: DC

## 2020-07-22 MED ORDER — METFORMIN HCL 500 MG PO TABS: 1000.0000 mg | ORAL_TABLET | Freq: Two times a day (BID) | ORAL | 5 refills | Status: DC

## 2020-07-22 MED ORDER — FERROUS SULFATE 325 (65 FE) MG PO TABS: 325.0000 mg | ORAL_TABLET | Freq: Two times a day (BID) | ORAL | 5 refills | Status: DC

## 2020-07-22 MED ORDER — FLUTICASONE-SALMETEROL 250-50 MCG/DOSE IN AEPB: 1.0000 | INHALATION_SPRAY | Freq: Two times a day (BID) | RESPIRATORY_TRACT | 3 refills | Status: DC

## 2020-07-22 MED ORDER — MONTELUKAST SODIUM 10 MG PO TABS: 10.0000 mg | ORAL_TABLET | Freq: Every day | ORAL | 1 refills | Status: DC

## 2020-07-22 MED ORDER — LISINOPRIL 10 MG PO TABS: 10.0000 mg | ORAL_TABLET | Freq: Every day | ORAL | 3 refills | Status: DC

## 2020-07-22 MED ORDER — POTASSIUM CHLORIDE ER 10 MEQ PO TBCR: 10.0000 meq | EXTENDED_RELEASE_TABLET | Freq: Every day | ORAL | 5 refills | Status: DC

## 2020-07-22 MED ORDER — FUROSEMIDE 20 MG PO TABS: 20.0000 mg | ORAL_TABLET | Freq: Every day | ORAL | 5 refills | Status: DC

## 2020-07-22 MED ORDER — GLIPIZIDE 5 MG PO TABS: 5.0000 mg | ORAL_TABLET | Freq: Two times a day (BID) | ORAL | 3 refills | Status: DC

## 2020-07-22 MED ORDER — ALBUTEROL SULFATE HFA 108 (90 BASE) MCG/ACT IN AERS: 2.0000 | INHALATION_SPRAY | Freq: Four times a day (QID) | RESPIRATORY_TRACT | 2 refills | Status: DC | PRN

## 2020-07-22 NOTE — Patient Instructions (Addendum)
Eliminate sugar from your diet and white carbohydrates  Drink 80-100 ounces water daily    Influenza Virus Vaccine injection (Fluarix) What is this medicine? INFLUENZA VIRUS VACCINE (in floo EN zuh VAHY ruhs vak SEEN) helps to reduce the risk of getting influenza also known as the flu. This medicine may be used for other purposes; ask your health care provider or pharmacist if you have questions. COMMON BRAND NAME(S): Fluarix, Fluzone What should I tell my health care provider before I take this medicine? They need to know if you have any of these conditions:  bleeding disorder like hemophilia  fever or infection  Guillain-Barre syndrome or other neurological problems  immune system problems  infection with the human immunodeficiency virus (HIV) or AIDS  low blood platelet counts  multiple sclerosis  an unusual or allergic reaction to influenza virus vaccine, eggs, chicken proteins, latex, gentamicin, other medicines, foods, dyes or preservatives  pregnant or trying to get pregnant  breast-feeding How should I use this medicine? This vaccine is for injection into a muscle. It is given by a health care professional. A copy of Vaccine Information Statements will be given before each vaccination. Read this sheet carefully each time. The sheet may change frequently. Talk to your pediatrician regarding the use of this medicine in children. Special care may be needed. Overdosage: If you think you have taken too much of this medicine contact a poison control center or emergency room at once. NOTE: This medicine is only for you. Do not share this medicine with others. What if I miss a dose? This does not apply. What may interact with this medicine?  chemotherapy or radiation therapy  medicines that lower your immune system like etanercept, anakinra, infliximab, and adalimumab  medicines that treat or prevent blood clots like warfarin  phenytoin  steroid medicines like  prednisone or cortisone  theophylline  vaccines This list may not describe all possible interactions. Give your health care provider a list of all the medicines, herbs, non-prescription drugs, or dietary supplements you use. Also tell them if you smoke, drink alcohol, or use illegal drugs. Some items may interact with your medicine. What should I watch for while using this medicine? Report any side effects that do not go away within 3 days to your doctor or health care professional. Call your health care provider if any unusual symptoms occur within 6 weeks of receiving this vaccine. You may still catch the flu, but the illness is not usually as bad. You cannot get the flu from the vaccine. The vaccine will not protect against colds or other illnesses that may cause fever. The vaccine is needed every year. What side effects may I notice from receiving this medicine? Side effects that you should report to your doctor or health care professional as soon as possible:  allergic reactions like skin rash, itching or hives, swelling of the face, lips, or tongue Side effects that usually do not require medical attention (report to your doctor or health care professional if they continue or are bothersome):  fever  headache  muscle aches and pains  pain, tenderness, redness, or swelling at site where injected  weak or tired This list may not describe all possible side effects. Call your doctor for medical advice about side effects. You may report side effects to FDA at 1-800-FDA-1088. Where should I keep my medicine? This vaccine is only given in a clinic, pharmacy, doctor's office, or other health care setting and will not be stored at home.  NOTE: This sheet is a summary. It may not cover all possible information. If you have questions about this medicine, talk to your doctor, pharmacist, or health care provider.  2020 Elsevier/Gold Standard (2008-03-19 09:30:40)

## 2020-07-22 NOTE — Progress Notes (Signed)
Patient presents for vaccination against influenza per orders of [place] Consent given. Counseling provided. No contraindications exists. Vaccine administered without incident.   Butch Penny, PharmD, CPP Clinical Pharmacist [facility] [phone]

## 2020-07-22 NOTE — Progress Notes (Signed)
Patient ID: Kaylee [name], female   DOB: 1971/01/10, 49 y.o.   MRN: [id number]   Kaylee [name], is a 49 y.o. female  PPJ:[id number]  PYK:[id number]  DOB - 1971-05-01  Subjective:  Chief Complaint and HPI: Kaylee [name] is a 49 y.o. female here today for med RF, bloodwork, cough X 3 weeks, and wants to increase dose on arthritis meds and get flu shot.  She has not been vaccinated against covid and does not want to be.  Cough X 3 weeks with increased wheezing and albuterol use.  No fever.     Wants to see about increasing dose on arthritis meds for days when pain is worse.    Blood sugars running 120-160.  No HA/Dizziness/CP.  ROS:   Constitutional:  No f/c, No night sweats, No unexplained weight loss. EENT:  No vision changes, No blurry vision, No hearing changes. No mouth, throat, or ear problems.  Respiratory: + cough, some wheezing Cardiac: No CP, no palpitations GI:  No abd pain, No N/V/D. GU: No Urinary s/sx Musculoskeletal: arthritis, knee, back pain Neuro: No headache, no dizziness, no motor weakness.  Skin: No rash Endocrine:  No polydipsia. No polyuria.  Psych: Denies SI/HI  No problems updated.  ALLERGIES: No Known Allergies  PAST MEDICAL HISTORY: Past Medical History:  Diagnosis Date   Diabetes mellitus without complication ([place])    Heavy menstrual period    Morbid obesity ([place])     MEDICATIONS AT HOME: Prior to Admission medications   Medication Sig Start Date End Date Taking? Authorizing Provider  Accu-Chek FastClix Lancets MISC Use as instructed. Check blood glucose level by fingerstick three per day.  E11.65 09/03/19   Gildardo Pounds, NP  albuterol (VENTOLIN HFA) 108 (90 Base) MCG/ACT inhaler Inhale 2 puffs into the lungs every 6 (six) hours as needed for wheezing or shortness of breath. 07/22/20   Argentina Donovan, PA-C  atorvastatin (LIPITOR) 20 MG tablet Take 1 tablet (20 mg total) by mouth daily. 07/22/20   Argentina Donovan, PA-C  Blood  Glucose Calibration (ACCU-CHEK GUIDE CONTROL) LIQD 1 each by In Vitro route once as needed for up to 1 dose. E11.65 09/03/19   Gildardo Pounds, NP  Blood Glucose Monitoring Suppl (TRUE METRIX METER) w/Device KIT Use to check blood sugar TID. 02/14/20   Fulp, Cammie, MD  carvedilol (COREG) 25 MG tablet Take 1 tablet (25 mg total) by mouth 2 (two) times daily. 07/22/20 10/20/20  Argentina Donovan, PA-C  cyclobenzaprine (FLEXERIL) 10 MG tablet Take 1 tablet (10 mg total) by mouth at bedtime. Prn muscle spasms 07/22/20   Argentina Donovan, PA-C  ferrous sulfate (FEROSUL) 325 (65 FE) MG tablet Take 1 tablet (325 mg total) by mouth 2 (two) times daily with a meal. Must have office visit for refills 07/22/20   Freeman Caldron M, PA-C  Fluticasone-Salmeterol (ADVAIR DISKUS) 250-50 MCG/DOSE AEPB Inhale 1 puff into the lungs 2 (two) times daily. 07/22/20   Argentina Donovan, PA-C  furosemide (LASIX) 20 MG tablet Take 1 tablet (20 mg total) by mouth daily. 07/22/20 10/20/20  Argentina Donovan, PA-C  glipiZIDE (GLUCOTROL) 5 MG tablet Take 1 tablet (5 mg total) by mouth 2 (two) times daily before a meal. 07/22/20   Marki Frede, Dionne Bucy, PA-C  glucose blood (TRUE METRIX BLOOD GLUCOSE TEST) test strip Use to check blood sugar TID. 09/03/19   Fulp, Cammie, MD  lisinopril (ZESTRIL) 10 MG tablet Take 1 tablet (10 mg total) by mouth daily.  07/22/20   Argentina Donovan, PA-C  medroxyPROGESTERone (PROVERA) 10 MG tablet TAKE 2 TABLETS (20 MG TOTAL) BY MOUTH DAILY. 07/15/20   Sloan Leiter, MD  meloxicam (MOBIC) 7.5 MG tablet 1-2 tabs daily As needed for arthritis pain 07/22/20   Freeman Caldron M, PA-C  metFORMIN (GLUCOPHAGE) 500 MG tablet Take 2 tablets (1,000 mg total) by mouth 2 (two) times daily with a meal. Must have office visit for refills 07/22/20   Freeman Caldron M, PA-C  montelukast (SINGULAIR) 10 MG tablet Take 1 tablet (10 mg total) by mouth at bedtime. 07/22/20   Argentina Donovan, PA-C  potassium chloride  (KLOR-CON) 10 MEQ tablet Take 1 tablet (10 mEq total) by mouth daily. 07/22/20   Argentina Donovan, PA-C  TRUEplus Lancets 28G MISC Use to check blood sugar TID. 09/03/19   Fulp, Cammie, MD     Objective:  EXAM:   Vitals:   07/22/20 0838  BP: 120/78  Pulse: (!) 101  Resp: 16  SpO2: 99%  Weight: (!) 436 lb 6.4 oz (197.9 kg)  Height: $Remove'5\' 7"'YCtIdFv$  (1.702 m)    General appearance : A&OX3. NAD. Non-toxic-appearing.  Morbidly obese HEENT: Atraumatic and Normocephalic.  PERRLA. EOM intact.  Neck: supple, no JVD. No cervical lymphadenopathy. No thyromegaly Chest/Lungs:  Breathing-non-labored, Good air entry bilaterally, breath sounds normal without rales, rhonchi.  There is mild  wheezing throughout CVS: S1 S2 regular, no murmurs, gallops, rubs  Abdomen: Bowel sounds present, Non tender and not distended with no gaurding, rigidity or rebound. Extremities: Bilateral Lower Ext shows no edema, both legs are warm to touch with = pulse throughout Neurology:  CN II-XII grossly intact, Non focal.   Psych:  TP linear. J/I WNL. Normal speech. Appropriate eye contact and affect.  Skin:  No Rash  Data Review Lab Results  Component Value Date   HGBA1C 6.8 07/22/2020   HGBA1C 9.8 (H) 09/02/2019   HGBA1C 5.5 10/13/2018     Assessment & Plan   1. Type 2 diabetes mellitus with hyperglycemia, without long-term current use of insulin (HCC) Not at goal.  Work on diet and weight loss.  For now, add glipizide - Glucose (CBG) - HgB A1c - Microalbumin / creatinine urine ratio - metFORMIN (GLUCOPHAGE) 500 MG tablet; Take 2 tablets (1,000 mg total) by mouth 2 (two) times daily with a meal. Must have office visit for refills  Dispense: 120 tablet; Refill: 5 - glipiZIDE (GLUCOTROL) 5 MG tablet; Take 1 tablet (5 mg total) by mouth 2 (two) times daily before a meal.  Dispense: 60 tablet; Refill: 3 - Comprehensive metabolic panel - TSH  2. Influenza vaccine needed given  3. History of anemia - ferrous  sulfate (FEROSUL) 325 (65 FE) MG tablet; Take 1 tablet (325 mg total) by mouth 2 (two) times daily with a meal. Must have office visit for refills  Dispense: 60 tablet; Refill: 5  4. Menorrhagia with regular cycle  - ferrous sulfate (FEROSUL) 325 (65 FE) MG tablet; Take 1 tablet (325 mg total) by mouth 2 (two) times daily with a meal. Must have office visit for refills  Dispense: 60 tablet; Refill: 5 - CBC with Differential/Platelet  5. Primary osteoarthritis of both knees - meloxicam (MOBIC) 7.5 MG tablet; 1-2 tabs daily As needed for arthritis pain  Dispense: 90 tablet; Refill: 1  6. Moderate persistent asthma without complication uncontrolled - montelukast (SINGULAIR) 10 MG tablet; Take 1 tablet (10 mg total) by mouth at bedtime.  Dispense: 90 tablet;  Refill: 1 - albuterol (VENTOLIN HFA) 108 (90 Base) MCG/ACT inhaler; Inhale 2 puffs into the lungs every 6 (six) hours as needed for wheezing or shortness of breath.  Dispense: 18 g; Refill: 2 - Fluticasone-Salmeterol (ADVAIR DISKUS) 250-50 MCG/DOSE AEPB; Inhale 1 puff into the lungs 2 (two) times daily.  Dispense: 1 each; Refill: 3  7. Essential hypertension - lisinopril (ZESTRIL) 10 MG tablet; Take 1 tablet (10 mg total) by mouth daily.  Dispense: 90 tablet; Refill: 3 - furosemide (LASIX) 20 MG tablet; Take 1 tablet (20 mg total) by mouth daily.  Dispense: 30 tablet; Refill: 5 - carvedilol (COREG) 25 MG tablet; Take 1 tablet (25 mg total) by mouth 2 (two) times daily.  Dispense: 60 tablet; Refill: 5 - potassium chloride (KLOR-CON) 10 MEQ tablet; Take 1 tablet (10 mEq total) by mouth daily.  Dispense: 30 tablet; Refill: 5 - Comprehensive metabolic panel - TSH  8. Muscle spasm - cyclobenzaprine (FLEXERIL) 10 MG tablet; Take 1 tablet (10 mg total) by mouth at bedtime. Prn muscle spasms  Dispense: 20 tablet; Refill: 0  9. Hyperlipidemia, unspecified hyperlipidemia type - atorvastatin (LIPITOR) 20 MG tablet; Take 1 tablet (20 mg total) by  mouth daily.  Dispense: 90 tablet; Refill: 3 - Lipid pane   Patient have been counseled extensively about nutrition and exercise  Return in about 3 months (around 10/22/2020) for PCP;  chronic conditions.  The patient was given clear instructions to go to ER or return to medical center if symptoms don't improve, worsen or new problems develop. The patient verbalized understanding. The patient was told to call to get lab results if they haven't heard anything in the next week.     Freeman Caldron, PA-C [facility] [place], [place]   07/22/2020, 8:56 AM

## 2020-07-23 LAB — CBC WITH DIFFERENTIAL/PLATELET
Basophils Absolute: 0.1 10*3/uL (ref 0.0–0.2)
Basos: 1 %
EOS (ABSOLUTE): 0.1 10*3/uL (ref 0.0–0.4)
Eos: 2 %
Hematocrit: 41.6 % (ref 34.0–46.6)
Hemoglobin: 13.3 g/dL (ref 11.1–15.9)
Immature Grans (Abs): 0 10*3/uL (ref 0.0–0.1)
Immature Granulocytes: 0 %
Lymphocytes Absolute: 3.5 10*3/uL — ABNORMAL HIGH (ref 0.7–3.1)
Lymphs: 39 %
MCH: 30 pg (ref 26.6–33.0)
MCHC: 32 g/dL (ref 31.5–35.7)
MCV: 94 fL (ref 79–97)
Monocytes Absolute: 0.6 10*3/uL (ref 0.1–0.9)
Monocytes: 7 %
Neutrophils Absolute: 4.8 10*3/uL (ref 1.4–7.0)
Neutrophils: 51 %
Platelets: 290 10*3/uL (ref 150–450)
RBC: 4.44 x10E6/uL (ref 3.77–5.28)
RDW: 12.4 % (ref 11.7–15.4)
WBC: 9.1 10*3/uL (ref 3.4–10.8)

## 2020-07-23 LAB — COMPREHENSIVE METABOLIC PANEL
ALT: 18 IU/L (ref 0–32)
AST: 17 IU/L (ref 0–40)
Albumin/Globulin Ratio: 0.9 — ABNORMAL LOW (ref 1.2–2.2)
Albumin: 3.7 g/dL — ABNORMAL LOW (ref 3.8–4.8)
Alkaline Phosphatase: 116 IU/L (ref 44–121)
BUN/Creatinine Ratio: 14 (ref 9–23)
BUN: 7 mg/dL (ref 6–24)
Bilirubin Total: <0.2 mg/dL (ref 0.0–1.2)
CO2: 21 mmol/L (ref 20–29)
Calcium: 10.4 mg/dL — ABNORMAL HIGH (ref 8.7–10.2)
Chloride: 106 mmol/L (ref 96–106)
Creatinine, Ser: 0.51 mg/dL — ABNORMAL LOW (ref 0.57–1.00)
GFR calc Af Amer: 131 mL/min/{1.73_m2} (ref >59)
GFR calc non Af Amer: 113 mL/min/{1.73_m2} (ref >59)
Globulin, Total: 4.3 g/dL (ref 1.5–4.5)
Glucose: 153 mg/dL — ABNORMAL HIGH (ref 65–99)
Potassium: 4.3 mmol/L (ref 3.5–5.2)
Sodium: 139 mmol/L (ref 134–144)
Total Protein: 8 g/dL (ref 6.0–8.5)

## 2020-07-23 LAB — LIPID PANEL
Chol/HDL Ratio: 5 ratio — ABNORMAL HIGH (ref 0.0–4.4)
Cholesterol, Total: 154 mg/dL (ref 100–199)
HDL: 31 mg/dL — ABNORMAL LOW (ref >39)
LDL Chol Calc (NIH): 107 mg/dL — ABNORMAL HIGH (ref 0–99)
Triglycerides: 81 mg/dL (ref 0–149)
VLDL Cholesterol Cal: 16 mg/dL (ref 5–40)

## 2020-07-23 LAB — TSH: TSH: 2.51 u[IU]/mL (ref 0.450–4.500)

## 2020-08-06 ENCOUNTER — Encounter: Payer: Self-pay | Admitting: *Deleted

## 2020-08-06 ENCOUNTER — Telehealth: Payer: Self-pay

## 2020-08-06 NOTE — Telephone Encounter (Signed)
Patient given results as noted by Georgian Co, PA-C on 07/23/20. Patient verbalized understanding.  Anders Simmonds, PA-C  07/23/2020 10:42 AM EST     Kidney function is a little impaired. Drink 80-100 ounces water daily. Blood sugar elevated as we discussed. Blood count is good. Liver function and electrolytes are normal. Follow up as planned. Cholesterol is ok. Thanks, Georgian Co, PA-C

## 2020-08-24 ENCOUNTER — Encounter: Payer: Self-pay | Admitting: Physician Assistant

## 2020-08-26 ENCOUNTER — Ambulatory Visit: Payer: Medicaid Other | Admitting: Obstetrics and Gynecology

## 2020-08-27 ENCOUNTER — Other Ambulatory Visit: Payer: Self-pay | Admitting: Physician Assistant

## 2020-08-27 DIAGNOSIS — J4540 Moderate persistent asthma, uncomplicated: Secondary | ICD-10-CM

## 2020-08-27 MED ORDER — FLUTICASONE-SALMETEROL 250-50 MCG/DOSE IN AEPB: 1.0000 | INHALATION_SPRAY | Freq: Two times a day (BID) | RESPIRATORY_TRACT | 3 refills | Status: DC

## 2020-09-28 ENCOUNTER — Telehealth: Payer: Medicaid Other | Admitting: Obstetrics and Gynecology

## 2020-09-28 ENCOUNTER — Telehealth: Payer: Self-pay | Admitting: Obstetrics and Gynecology

## 2020-09-28 NOTE — Progress Notes (Signed)
I connected with  Kaylee [name] on 09/28/20 at 10:35 AM EST by telephone and verified that I am speaking with the correct person using two identifiers.   I discussed the limitations, risks, security and privacy concerns of performing an evaluation and management service by telephone and the availability of in person appointments. I also discussed with the patient that there may be a patient responsible charge related to this service. The patient expressed understanding and agreed to proceed.  Isabell Jarvis, RN 09/28/2020  10:32 AM   Left message for pt to return call to office before virtual visit x 2 with mobile number and home number.

## 2020-09-28 NOTE — Telephone Encounter (Signed)
Attempted to reach patient about her missed appointment. Message left on voicemail.

## 2020-09-28 NOTE — Progress Notes (Signed)
Patient did not keep her appt.

## 2020-10-28 ENCOUNTER — Other Ambulatory Visit: Payer: Self-pay | Admitting: Obstetrics and Gynecology

## 2020-10-28 DIAGNOSIS — N939 Abnormal uterine and vaginal bleeding, unspecified: Secondary | ICD-10-CM

## 2020-11-02 ENCOUNTER — Ambulatory Visit: Payer: Medicaid Other | Admitting: Obstetrics and Gynecology

## 2020-11-28 NOTE — Progress Notes (Signed)
Subjective:    Patient ID: Kaylee [name], female    DOB: April 27, 1971, 50 y.o.   MRN: [id number]  50 y.o.F here to est PCP.   11/30/20 This is a 50 year old female here to reestablish for primary care face-to-face.  Last visit was in December 2021 and she formally saw Dr. Chapman Fitch in the past.  Patient has history of morbid obesity, diastolic heart failure, hyperlipidemia, type 2 diabetes, chronic arthritis of the knees.  Patient also history of menorrhagia with regular cycle.  History of anemia.  She had her flu vaccine.  She is not had a COVID vaccine series as she has religious objections.  Patient does also have moderate persistent asthma which is controlled with Singulair and LABA inhaled steroid inhaler.  Patient does have resting tachycardia and on arrival blood pressure is good heart rate 135.  Patient does note mild daytime hypersomnolence and excess snoring at night she is never had sleep study performed.  Patient is due a colonoscopy hepatitis C assay and tetanus vaccine.  Note patient does not have insurance at this time she is applying for the orange card blue card, health discount.  At the last visit her LDL was 107 not yet at goal she is on 20 mg daily of atorvastatin.  Note on arrival today blood sugar is 143 and A1c is good at 6.7.  Patient does note coughing with her asthma and notes mild reflux symptoms.  She notes hoarseness in the voice. Note this patient maintains Metformin and glipizide for her diabetes which is under good control at this time.  She has preserved ejection fraction with history of diastolic heart failure.  She is not seen cardiology since 2020.  She has occasional pedal edema and does take furosemide daily.  Past Medical History:  Diagnosis Date  . Diabetes mellitus without complication ([place])   . Heavy menstrual period   . Morbid obesity ([place])      Family History  Problem Relation Age of Onset  . Leukemia Mother   . Breast cancer Maternal Grandmother 30      Social History   Socioeconomic History  . Marital status: Divorced    Spouse name: Not on file  . Number of children: 4  . Years of education: Not on file  . Highest education level: 12th grade  Occupational History  . Not on file  Tobacco Use  . Smoking status: Never Smoker  . Smokeless tobacco: Never Used  Vaping Use  . Vaping Use: Never used  Substance and Sexual Activity  . Alcohol use: Not Currently  . Drug use: Not Currently  . Sexual activity: Not Currently  Other Topics Concern  . Not on file  Social History Narrative  . Not on file   Social Determinants of Health   Financial Resource Strain: Not on file  Food Insecurity: Not on file  Transportation Needs: Not on file  Physical Activity: Not on file  Stress: Not on file  Social Connections: Not on file  Intimate Partner Violence: Not on file     No Known Allergies   Outpatient Medications Prior to Visit  Medication Sig Dispense Refill  . Blood Glucose Monitoring Suppl (TRUE METRIX METER) w/Device KIT Use to check blood sugar TID. 1 kit 0  . medroxyPROGESTERone (PROVERA) 10 MG tablet TAKE 2 TABLETS (20 MG TOTAL) BY MOUTH DAILY. 60 tablet 1  . metFORMIN (GLUCOPHAGE) 500 MG tablet Take 2 tablets (1,000 mg total) by mouth 2 (two) times daily with a  meal. Must have office visit for refills 120 tablet 5  . Accu-Chek FastClix Lancets MISC Use as instructed. Check blood glucose level by fingerstick three per day.  E11.65 200 each 3  . albuterol (VENTOLIN HFA) 108 (90 Base) MCG/ACT inhaler Inhale 2 puffs into the lungs every 6 (six) hours as needed for wheezing or shortness of breath. 18 g 2  . atorvastatin (LIPITOR) 20 MG tablet Take 1 tablet (20 mg total) by mouth daily. 90 tablet 3  . Blood Glucose Calibration (ACCU-CHEK GUIDE CONTROL) LIQD 1 each by In Vitro route once as needed for up to 1 dose. E11.65 1 each 0  . cyclobenzaprine (FLEXERIL) 10 MG tablet Take 1 tablet (10 mg total) by mouth at bedtime. Prn muscle  spasms 20 tablet 0  . ferrous sulfate (FEROSUL) 325 (65 FE) MG tablet Take 1 tablet (325 mg total) by mouth 2 (two) times daily with a meal. Must have office visit for refills 60 tablet 5  . Fluticasone-Salmeterol (ADVAIR DISKUS) 250-50 MCG/DOSE AEPB Inhale 1 puff into the lungs 2 (two) times daily. 1 each 3  . glipiZIDE (GLUCOTROL) 5 MG tablet Take 1 tablet (5 mg total) by mouth 2 (two) times daily before a meal. 60 tablet 3  . glucose blood (TRUE METRIX BLOOD GLUCOSE TEST) test strip Use to check blood sugar TID. 100 each 11  . lisinopril (ZESTRIL) 10 MG tablet Take 1 tablet (10 mg total) by mouth daily. 90 tablet 3  . meloxicam (MOBIC) 7.5 MG tablet 1-2 tabs daily As needed for arthritis pain 90 tablet 1  . montelukast (SINGULAIR) 10 MG tablet Take 1 tablet (10 mg total) by mouth at bedtime. 90 tablet 1  . potassium chloride (KLOR-CON) 10 MEQ tablet Take 1 tablet (10 mEq total) by mouth daily. 30 tablet 5  . TRUEplus Lancets 28G MISC Use to check blood sugar TID. 100 each 11  . carvedilol (COREG) 25 MG tablet Take 1 tablet (25 mg total) by mouth 2 (two) times daily. 60 tablet 5  . furosemide (LASIX) 20 MG tablet Take 1 tablet (20 mg total) by mouth daily. 30 tablet 5   No facility-administered medications prior to visit.     Review of Systems  Constitutional: Positive for fatigue and unexpected weight change.  HENT: Negative.   Eyes: Positive for visual disturbance.  Respiratory: Positive for cough and shortness of breath.   Cardiovascular: Positive for palpitations and leg swelling. Negative for chest pain.  Gastrointestinal: Negative.   Endocrine: Negative.   Genitourinary: Positive for menstrual problem.  Musculoskeletal:       Bilateral knee pain  Skin: Negative.   Allergic/Immunologic: Negative.   Neurological: Negative.   Hematological: Negative.   Psychiatric/Behavioral: Negative.        Objective:   Physical Exam Vitals:   11/30/20 1051  BP: 125/83  SpO2: 98%   Weight: (!) 433 lb 12.8 oz (196.8 kg)  Height: 5' 7" (1.702 m)    Gen: Pleasant, morbidly obese,  in no distress,  normal affect  ENT: No lesions,  mouth clear,  oropharynx clear, no postnasal drip  Neck: No JVD, no TMG, no carotid bruits  Lungs: No use of accessory muscles, no dullness to percussion, clear without rales or rhonchi  Cardiovascular: RRR, heart sounds normal, no murmur or gallops, no peripheral edema  Abdomen: soft and NT, no HSM,  BS normal  Musculoskeletal: No deformities, no cyanosis or clubbing  Neuro: alert, non focal  Skin: Warm, no lesions or  rashes  BMP Latest Ref Rng & Units 07/22/2020 09/02/2019 08/23/2019  Glucose 65 - 99 mg/dL 153(H) 268(H) 255(H)  BUN 6 - 24 mg/dL _0 Creatinine 0.57 - 1.00 mg/dL 0.51(L) 0.60 0.64  BUN/Creat Ratio 9 - _1 Sodium 134 - 144 mmol/L 139 136 135  Potassium 3.5 - 5.2 mmol/L 4.3 4.8 4.7  Chloride 96 - 106 mmol/L 106 102 100  CO2 20 - 29 mmol/L _2 Calcium 8.7 - 10.2 mg/dL 10.4(H) 11.0(H) 11.0(H)   Hepatic Function Latest Ref Rng & Units 07/22/2020 10/12/2018  Total Protein 6.0 - 8.5 g/dL 8.0 8.4(H)  Albumin 3.8 - 4.8 g/dL 3.7(L) 2.7(L)  AST 0 - 40 IU/L 17 22  ALT 0 - 32 IU/L 18 11  Alk Phosphatase 44 - 121 IU/L 116 82  Total Bilirubin 0.0 - 1.2 mg/dL <0.2 <0.1(L)   CBC Latest Ref Rng & Units 07/22/2020 09/02/2019 01/23/2019  WBC 3.4 - 10.8 x10E3/uL 9.1 10.4 9.1  Hemoglobin 11.1 - 15.9 g/dL 13.3 15.9 15.4  Hematocrit 34.0 - 46.6 % 41.6 47.6(H) 53.2(H)  Platelets 150 - 450 x10E3/uL 290 290 348   HbA1c, POC (controlled diabetic range)  Date Value Ref Range Status  11/30/2020 6.7 0.0 - 7.0 % Final  07/22/2020 6.8 0.0 - 7.0 % Final   Hgb A1c MFr Bld  Date Value Ref Range Status  09/02/2019 9.8 (H) 4.8 - 5.6 % Final    Comment:             Prediabetes: 5.7 - 6.4          Diabetes: >6.4          Glycemic control for adults with diabetes: <7.0        Assessment & Plan:  I personally reviewed  all images and lab data in the [facility] system as well as any outside material available during this office visit and agree with the  radiology impressions.   Essential hypertension History of hypertension with preserved ejection fraction diastolic heart failure  Continue Coreg, furosemide, potassium, however discontinue lisinopril and begin losartan 50 mg daily because of cough  Check metabolic panel this visit  Referral back to cardiology for reassessment including repeat echocardiogram  Heart failure with preserved ejection fraction, unspecified HF chronicity ([place]) As per hypertension assessment refer to cardiology  Moderate persistent asthma without complication Stable at this time refill Advair and albuterol and Singulair  Type 2 diabetes mellitus with hyperglycemia, without long-term current use of insulin (HCC) A1c at goal 6.7 continue Metformin and glipizide  Primary osteoarthritis of both knees Prior documentation of arthritis in both knees aggravated by morbid obesity  Discontinue meloxicam and give trial of topical Voltaren gel  History of anemia Last hemoglobin stable refill iron supplement  Hyperlipidemia Related to diabetes not at goal Increase Lipitor to 40 mg daily  Menorrhagia with regular cycle Monitor for now  Muscle spasm Refill Flexeril  Obesity, morbid, BMI 50 or higher ([place]) Dietary education given   Aza was seen today for diabetes.  Diagnoses and all orders for this visit:  Type 2 diabetes mellitus with hyperglycemia, without long-term current use of insulin (HCC) -     POCT glucose (manual entry) -     POCT glycosylated hemoglobin (Hb A1C) -     Basic Metabolic Panel -     glipiZIDE (GLUCOTROL) 5 MG tablet; Take 1 tablet (5 mg total) by mouth 2 (two) times daily before a meal. -  glucose blood (TRUE METRIX BLOOD GLUCOSE TEST) test strip; Use to check blood sugar TID. -     TRUEplus Lancets 28G MISC; Use to check blood sugar TID.  Heart  failure with preserved ejection fraction, unspecified HF chronicity ([place]) -     Ambulatory referral to Cardiology -     Basic Metabolic Panel  Obesity, morbid, BMI 50 or higher (HCC)  Moderate persistent asthma without complication -     albuterol (VENTOLIN HFA) 108 (90 Base) MCG/ACT inhaler; Inhale 2 puffs into the lungs every 6 (six) hours as needed for wheezing or shortness of breath. -     Discontinue: Fluticasone-Salmeterol (ADVAIR DISKUS) 250-50 MCG/DOSE AEPB; Inhale 1 puff into the lungs 2 (two) times daily. -     montelukast (SINGULAIR) 10 MG tablet; Take 1 tablet (10 mg total) by mouth at bedtime. -     Fluticasone-Salmeterol (ADVAIR DISKUS) 250-50 MCG/DOSE AEPB; Inhale 1 puff into the lungs 2 (two) times daily.  Hyperlipidemia, unspecified hyperlipidemia type -     atorvastatin (LIPITOR) 40 MG tablet; Take 1 tablet (40 mg total) by mouth daily.  Essential hypertension -     carvedilol (COREG) 25 MG tablet; Take 1 tablet (25 mg total) by mouth 2 (two) times daily. -     furosemide (LASIX) 20 MG tablet; Take 1 tablet (20 mg total) by mouth daily. -     potassium chloride (KLOR-CON) 10 MEQ tablet; Take 1 tablet (10 mEq total) by mouth daily.  Muscle spasm -     cyclobenzaprine (FLEXERIL) 10 MG tablet; Take 1 tablet (10 mg total) by mouth at bedtime. Prn muscle spasms  History of anemia -     ferrous sulfate (FEROSUL) 325 (65 FE) MG tablet; Take 1 tablet (325 mg total) by mouth 2 (two) times daily with a meal. Must have office visit for refills  Menorrhagia with regular cycle -     ferrous sulfate (FEROSUL) 325 (65 FE) MG tablet; Take 1 tablet (325 mg total) by mouth 2 (two) times daily with a meal. Must have office visit for refills  Colon cancer screening -     Fecal occult blood, imunochemical  Need for hepatitis C screening test -     HCV Ab w Reflex to Quant PCR  Cardiomegaly  Primary osteoarthritis of both knees  Microcytic anemia  Other orders -     diclofenac  Sodium (VOLTAREN) 1 % GEL; Apply 2 g topically in the morning and at bedtime. To both knees -     losartan (COZAAR) 50 MG tablet; Take 1 tablet (50 mg total) by mouth daily.

## 2020-11-30 ENCOUNTER — Encounter: Payer: Self-pay | Admitting: Critical Care Medicine

## 2020-11-30 ENCOUNTER — Other Ambulatory Visit: Payer: Self-pay | Admitting: Critical Care Medicine

## 2020-11-30 ENCOUNTER — Other Ambulatory Visit: Payer: Self-pay

## 2020-11-30 ENCOUNTER — Ambulatory Visit: Payer: Self-pay | Attending: Critical Care Medicine | Admitting: Critical Care Medicine

## 2020-11-30 VITALS — BP 125/83 | Ht 67.0 in | Wt >= 6400 oz

## 2020-11-30 DIAGNOSIS — E119 Type 2 diabetes mellitus without complications: Secondary | ICD-10-CM

## 2020-11-30 DIAGNOSIS — I517 Cardiomegaly: Secondary | ICD-10-CM

## 2020-11-30 DIAGNOSIS — N920 Excessive and frequent menstruation with regular cycle: Secondary | ICD-10-CM

## 2020-11-30 DIAGNOSIS — Z1159 Encounter for screening for other viral diseases: Secondary | ICD-10-CM

## 2020-11-30 DIAGNOSIS — M170 Bilateral primary osteoarthritis of knee: Secondary | ICD-10-CM

## 2020-11-30 DIAGNOSIS — D509 Iron deficiency anemia, unspecified: Secondary | ICD-10-CM

## 2020-11-30 DIAGNOSIS — E1165 Type 2 diabetes mellitus with hyperglycemia: Secondary | ICD-10-CM

## 2020-11-30 DIAGNOSIS — J4540 Moderate persistent asthma, uncomplicated: Secondary | ICD-10-CM

## 2020-11-30 DIAGNOSIS — E785 Hyperlipidemia, unspecified: Secondary | ICD-10-CM

## 2020-11-30 DIAGNOSIS — I10 Essential (primary) hypertension: Secondary | ICD-10-CM

## 2020-11-30 DIAGNOSIS — Z862 Personal history of diseases of the blood and blood-forming organs and certain disorders involving the immune mechanism: Secondary | ICD-10-CM

## 2020-11-30 DIAGNOSIS — M62838 Other muscle spasm: Secondary | ICD-10-CM

## 2020-11-30 DIAGNOSIS — I5030 Unspecified diastolic (congestive) heart failure: Secondary | ICD-10-CM

## 2020-11-30 DIAGNOSIS — Z1211 Encounter for screening for malignant neoplasm of colon: Secondary | ICD-10-CM

## 2020-11-30 HISTORY — DX: Morbid (severe) obesity due to excess calories: E66.01

## 2020-11-30 HISTORY — DX: Essential (primary) hypertension: I10

## 2020-11-30 HISTORY — DX: Type 2 diabetes mellitus without complications: E11.9

## 2020-11-30 HISTORY — DX: Type 2 diabetes mellitus with hyperglycemia: E11.65

## 2020-11-30 LAB — POCT GLYCOSYLATED HEMOGLOBIN (HGB A1C): HbA1c, POC (controlled diabetic range): 6.7 % (ref 0.0–7.0)

## 2020-11-30 LAB — GLUCOSE, POCT (MANUAL RESULT ENTRY): POC Glucose: 145 mg/dl — AB (ref 70–99)

## 2020-11-30 MED ORDER — TRUEPLUS LANCETS 28G MISC
11 refills | Status: DC
  Filled 2021-02-18: qty 100, 33d supply, fill #0

## 2020-11-30 MED ORDER — TRUE METRIX BLOOD GLUCOSE TEST VI STRP
ORAL_STRIP | 11 refills | Status: DC
Start: 2020-11-30 — End: 2021-06-01

## 2020-11-30 MED ORDER — FLUTICASONE-SALMETEROL 250-50 MCG/DOSE IN AEPB: 1.0000 | INHALATION_SPRAY | Freq: Two times a day (BID) | RESPIRATORY_TRACT | 3 refills | Status: DC

## 2020-11-30 MED ORDER — ATORVASTATIN CALCIUM 40 MG PO TABS: 40.0000 mg | ORAL_TABLET | Freq: Every day | ORAL | 1 refills | Status: DC

## 2020-11-30 MED ORDER — ALBUTEROL SULFATE HFA 108 (90 BASE) MCG/ACT IN AERS: 2.0000 | INHALATION_SPRAY | Freq: Four times a day (QID) | RESPIRATORY_TRACT | 2 refills | Status: DC | PRN

## 2020-11-30 MED ORDER — CARVEDILOL 25 MG PO TABS: 25.0000 mg | ORAL_TABLET | Freq: Two times a day (BID) | ORAL | 5 refills | Status: DC

## 2020-11-30 MED ORDER — LOSARTAN POTASSIUM 50 MG PO TABS: 50.0000 mg | ORAL_TABLET | Freq: Every day | ORAL | 1 refills | Status: DC

## 2020-11-30 MED ORDER — FUROSEMIDE 20 MG PO TABS: 20.0000 mg | ORAL_TABLET | Freq: Every day | ORAL | 5 refills | Status: DC

## 2020-11-30 MED ORDER — POTASSIUM CHLORIDE ER 10 MEQ PO TBCR: 10.0000 meq | EXTENDED_RELEASE_TABLET | Freq: Every day | ORAL | 5 refills | Status: DC

## 2020-11-30 MED ORDER — GLIPIZIDE 5 MG PO TABS
5.0000 mg | ORAL_TABLET | Freq: Two times a day (BID) | ORAL | 3 refills | Status: DC
Start: 2020-11-30 — End: 2021-03-30

## 2020-11-30 MED ORDER — CYCLOBENZAPRINE HCL 10 MG PO TABS: 10.0000 mg | ORAL_TABLET | Freq: Every day | ORAL | 0 refills | Status: DC

## 2020-11-30 MED ORDER — DICLOFENAC SODIUM 1 % EX GEL: 2.0000 g | Freq: Two times a day (BID) | CUTANEOUS | 1 refills | Status: DC

## 2020-11-30 MED ORDER — FERROUS SULFATE 325 (65 FE) MG PO TABS: 325.0000 mg | ORAL_TABLET | Freq: Two times a day (BID) | ORAL | 5 refills | Status: DC

## 2020-11-30 MED ORDER — MONTELUKAST SODIUM 10 MG PO TABS: 10.0000 mg | ORAL_TABLET | Freq: Every day | ORAL | 1 refills | Status: DC

## 2020-11-30 MED FILL — ATORVASTATIN CALCIUM 40 MG: 40 | 90 days supply | Qty: 90 | Fill #0

## 2020-11-30 MED FILL — glipiZIDE 5 MG TABS: 5 | 30 days supply | Qty: 60 | Fill #0

## 2020-11-30 MED FILL — DICLOFENAC SODIUM 1% GEL: 1 | 20 days supply | Qty: 100 | Fill #0

## 2020-11-30 MED FILL — FERROUS SULFATE 325 MG TAB: 325 (65 FE) | 30 days supply | Qty: 60 | Fill #0

## 2020-11-30 MED FILL — MONTELUKAST SOD 10 MG TAB: 10 | 90 days supply | Qty: 90 | Fill #0

## 2020-11-30 NOTE — Assessment & Plan Note (Signed)
Monitor for now

## 2020-11-30 NOTE — Assessment & Plan Note (Signed)
Related to diabetes not at goal Increase Lipitor to 40 mg daily

## 2020-11-30 NOTE — Assessment & Plan Note (Signed)
Prior documentation of arthritis in both knees aggravated by morbid obesity  Discontinue meloxicam and give trial of topical Voltaren gel

## 2020-11-30 NOTE — Patient Instructions (Signed)
A tetanus vaccine was given  You have declined to receive the COVID vaccine due to religious reasons, please keep in mind that if you get a Covid-like illness please be tested right away as we now have oral treatments for this virus they will give you as an outpatient because you are extremely high risk due to your weight heart condition and diabetes  Refills on all your medications sent to our pharmacy however we have stopped the lisinopril and begun losartan 50 mg daily and replacement because the lisinopril is likely contributing to your cough  Labs today include metabolic panel hepatitis C test and we also gave you a kit to screen for colon cancer  Discontinue meloxicam and begin Voltaren gel to both knees twice daily for knee pain  The atorvastatin was increased to 40 mg a day because you are not yet at your cholesterol goal  Return to see Dr. Joya Gaskins in 2 months  A referral to cardiology has been made  We gave you a resource to get your eyes checked with your diabetes and your blurriness in your vision this is the [place] and [place]  location

## 2020-11-30 NOTE — Assessment & Plan Note (Addendum)
As per hypertension assessment refer to cardiology

## 2020-11-30 NOTE — Assessment & Plan Note (Signed)
Stable at this time refill Advair and albuterol and Singulair

## 2020-11-30 NOTE — Progress Notes (Signed)
Has cough with some SOB Swelling in legs.

## 2020-11-30 NOTE — Assessment & Plan Note (Signed)
Dietary education given 

## 2020-11-30 NOTE — Assessment & Plan Note (Signed)
A1c at goal 6.7 continue Metformin and glipizide

## 2020-11-30 NOTE — Assessment & Plan Note (Signed)
Last hemoglobin stable refill iron supplement

## 2020-11-30 NOTE — Assessment & Plan Note (Signed)
Refill Flexeril

## 2020-11-30 NOTE — Assessment & Plan Note (Signed)
History of hypertension with preserved ejection fraction diastolic heart failure  Continue Coreg, furosemide, potassium, however discontinue lisinopril and begin losartan 50 mg daily because of cough  Check metabolic panel this visit  Referral back to cardiology for reassessment including repeat echocardiogram

## 2020-12-01 ENCOUNTER — Other Ambulatory Visit: Payer: Self-pay | Admitting: Critical Care Medicine

## 2020-12-01 LAB — BASIC METABOLIC PANEL
BUN/Creatinine Ratio: 16 (ref 9–23)
BUN: 9 mg/dL (ref 6–24)
CO2: 21 mmol/L (ref 20–29)
Calcium: 11.4 mg/dL — ABNORMAL HIGH (ref 8.7–10.2)
Chloride: 102 mmol/L (ref 96–106)
Creatinine, Ser: 0.57 mg/dL (ref 0.57–1.00)
Glucose: 145 mg/dL — ABNORMAL HIGH (ref 65–99)
Potassium: 4.4 mmol/L (ref 3.5–5.2)
Sodium: 139 mmol/L (ref 134–144)
eGFR: 111 mL/min/{1.73_m2} (ref >59)

## 2020-12-01 LAB — HCV INTERPRETATION

## 2020-12-01 LAB — HCV AB W REFLEX TO QUANT PCR: HCV Ab: <0.1 s/co ratio (ref 0.0–0.9)

## 2020-12-04 ENCOUNTER — Telehealth: Payer: Self-pay

## 2020-12-04 NOTE — Telephone Encounter (Signed)
-----   Message from Storm Frisk, MD sent at 12/01/2020  6:38 AM EDT ----- Let pt know hep C neg  kidney normal but Calcium level slightly high.  I have put in more orders for more labs to evaluate the calcium.

## 2020-12-04 NOTE — Telephone Encounter (Signed)
Patient has viewed results via mychart on  12/03/20

## 2020-12-22 NOTE — Progress Notes (Signed)
Cardiology Office Note:    Date:  12/23/2020   ID:  Kaylee [name], DOB [birth year], MRN [id number]  PCP:  Elsie Stain, MD  Cardiologist:  Evalina Field, MD   Referring MD: Elsie Stain, MD   Chief Complaint  Patient presents with  . Follow-up    Chest pain, HTN    History of Present Illness:    Kaylee [name] is a 50 y.o. female with a hx of hypertension, chronic diastolic heart failure, morbid obesity, diabetes, hyperlipidemia, GERD, anemia, moderate persistent asthma, and suspected OSA, no formal sleep study.  She also has OA of both knees.  She established care with Dr. Audie Box in January 2021 for worsening dyspnea on exertion.  Recently hospitalized February 2020 and diagnosed with chronic diastolic heart failure.  Her BNP was normal but noted to be possibly falsely low in the setting of morbid obesity with a BMI greater than 50.  HCTZ was stopped and Lasix was started.  Carvedilol 25 mg twice daily was added.  Lisinopril was increased. She was unable to tolerate lisinopril due to cough.  She has a history of resting tachycardia. No follow up. She was recently seen by Dr. Joya Gaskins to reestablish care.  A1c has improved from 9.8% to 6.7% in 1 year.  He again noted resting tachycardia.  Given her hypertension and chronic diastolic heart failure he referred her back to cardiology.  She presents today for follow-up. She reports ongoing rapid heart rate and chest pain with activity. She describes chest pain following activity such as climbing stairs. This is accompanied by shortness of breath.  CP occurs about three times weekly. She describes CP as sharp and last less than 5 seconds. This is the same pain as 2 years ago when she was hospitalized. Of note, troponin x 3 negative at that time. CP is worse with deep inspiration. She now walks 10-15 min three times daily. She does not have chest pain during these walks.    Past Medical History:  Diagnosis Date  . Diabetes mellitus  without complication ([place])   . Heavy menstrual period   . Morbid obesity ([place])     Past Surgical History:  Procedure Laterality Date  . s/p of leep cervix      Current Medications: Current Meds  Medication Sig  . albuterol (VENTOLIN HFA) 108 (90 Base) MCG/ACT inhaler Inhale 2 puffs into the lungs every 6 (six) hours as needed for wheezing or shortness of breath.  Marland Kitchen atorvastatin (LIPITOR) 40 MG tablet Take 1 tablet (40 mg total) by mouth daily.  . Blood Glucose Monitoring Suppl (TRUE METRIX METER) w/Device KIT Use to check blood sugar TID.  Marland Kitchen cyclobenzaprine (FLEXERIL) 10 MG tablet Take 1 tablet (10 mg total) by mouth at bedtime. Prn muscle spasms  . diclofenac Sodium (VOLTAREN) 1 % GEL Apply 2 g topically in the morning and at bedtime. To both knees  . ferrous sulfate (FEROSUL) 325 (65 FE) MG tablet Take 1 tablet (325 mg total) by mouth 2 (two) times daily with a meal. Must have office visit for refills  . Fluticasone-Salmeterol (ADVAIR DISKUS) 250-50 MCG/DOSE AEPB Inhale 1 puff into the lungs 2 (two) times daily.  . furosemide (LASIX) 20 MG tablet Take 1 tablet (20 mg total) by mouth daily.  Marland Kitchen glipiZIDE (GLUCOTROL) 5 MG tablet Take 1 tablet (5 mg total) by mouth 2 (two) times daily before a meal.  . glucose blood (TRUE METRIX BLOOD GLUCOSE TEST) test strip Use to check blood  sugar TID.  Marland Kitchen isosorbide mononitrate (IMDUR) 30 MG 24 hr tablet Take 1 tablet (30 mg total) by mouth daily.  Marland Kitchen losartan (COZAAR) 50 MG tablet Take 1 tablet (50 mg total) by mouth daily.  . medroxyPROGESTERone (PROVERA) 10 MG tablet TAKE 2 TABLETS (20 MG TOTAL) BY MOUTH DAILY.  . metFORMIN (GLUCOPHAGE) 500 MG tablet Take 2 tablets (1,000 mg total) by mouth 2 (two) times daily with a meal. Must have office visit for refills  . metoprolol tartrate 75 MG TABS Take 50 mg by mouth 2 (two) times daily. Take 1.5 Tablets Twice Daily  . montelukast (SINGULAIR) 10 MG tablet Take 1 tablet (10 mg total) by mouth at bedtime.  .  potassium chloride (KLOR-CON) 10 MEQ tablet Take 1 tablet (10 mEq total) by mouth daily.  . TRUEplus Lancets 28G MISC Use to check blood sugar TID.  . [DISCONTINUED] carvedilol (COREG) 25 MG tablet Take 1 tablet (25 mg total) by mouth 2 (two) times daily.     Allergies:   Patient has no known allergies.   Social History   Socioeconomic History  . Marital status: Divorced    Spouse name: Not on file  . Number of children: 4  . Years of education: Not on file  . Highest education level: 12th grade  Occupational History  . Not on file  Tobacco Use  . Smoking status: Never Smoker  . Smokeless tobacco: Never Used  Vaping Use  . Vaping Use: Never used  Substance and Sexual Activity  . Alcohol use: Not Currently  . Drug use: Not Currently  . Sexual activity: Not Currently  Other Topics Concern  . Not on file  Social History Narrative  . Not on file   Social Determinants of Health   Financial Resource Strain: Not on file  Food Insecurity: Not on file  Transportation Needs: Not on file  Physical Activity: Not on file  Stress: Not on file  Social Connections: Not on file     Family History: The patient's family history includes Breast cancer (age of onset: 22) in her maternal grandmother; Leukemia in her mother.  ROS:   Please see the history of present illness.     All other systems reviewed and are negative.  EKGs/Labs/Other Studies Reviewed:    The following studies were reviewed today:  Echo 10/13/18: 1. The left ventricle has normal systolic function of 55-73%. The cavity  size was normal. There is no increased left ventricular wall thickness.  Echo evidence of normal diastolic relaxation.  2. The right ventricle has normal systolic function. The cavity was  normal. There is no increase in right ventricular wall thickness.  3. The mitral valve is normal in structure. No evidence of mitral valve  stenosis.  4. The tricuspid valve is normal in structure.  5.  The aortic valve has an indeterminant number of cusps.  6. The aortic root is normal in size and structure.  7. No evidence of left ventricular regional wall motion abnormalities.    EKG:  EKG is ordered today.  The ekg ordered today demonstrates sinus tachycardia with HR 112, poor R wave progression. TWI lead III  Recent Labs: 07/22/2020: ALT 18; Hemoglobin 13.3; Platelets 290; TSH 2.510 11/30/2020: BUN 9; Creatinine, Ser 0.57; Potassium 4.4; Sodium 139  Recent Lipid Panel    Component Value Date/Time   CHOL 154 07/22/2020 0907   TRIG 81 07/22/2020 0907   HDL 31 (L) 07/22/2020 0907   CHOLHDL 5.0 (H) 07/22/2020 2202  CHOLHDL 5.1 10/13/2018 0311   VLDL 19 10/13/2018 0311   LDLCALC 107 (H) 07/22/2020 0907    Physical Exam:    VS:  BP 138/88   Pulse (!) 118   Ht $R'5\' 7"'eY$  (1.702 m)   Wt (!) 438 lb (198.7 kg)   SpO2 97%   BMI 68.60 kg/m     Wt Readings from Last 3 Encounters:  12/23/20 (!) 438 lb (198.7 kg)  11/30/20 (!) 433 lb 12.8 oz (196.8 kg)  07/22/20 (!) 436 lb 6.4 oz (197.9 kg)     GEN: morbidly obese female in NAD HEENT: Normal NECK: No JVD; No carotid bruits LYMPHATICS: No lymphadenopathy CARDIAC: RRR, no murmurs, rubs, gallops RESPIRATORY:  Clear to auscultation without rales, wheezing or rhonchi  ABDOMEN: Soft, non-tender, non-distended MUSCULOSKELETAL:  No edema; No deformity  SKIN: Warm and dry NEUROLOGIC:  Alert and oriented x 3 PSYCHIATRIC:  Normal affect   ASSESSMENT:    1. Chest pain of uncertain etiology   2. Essential hypertension   3. Heart failure with preserved ejection fraction, unspecified HF chronicity ([place])   4. Inappropriate sinus tachycardia   5. Hyperlipidemia, unspecified hyperlipidemia type   6. Type 2 diabetes mellitus with hyperglycemia, without long-term current use of insulin ([facility])   7. Obesity, morbid, BMI 50 or higher ([facility])    PLAN:    In order of problems listed above:  Chest pain - CP sounds more atypical, but with  multiple risk factors for CAD - HTN, HLD, obesity, DM - she is not a good candidate for nuclear stress test due to body habitus - she is not a good CT coronary candidate due to tachycardia - will obtain echo first and will reach out to Dr. Audie Box to see if she would be a good candidate for stress MRI if chest pain persists   Chronic diastolic heart failure - Carvedilol twice daily, 20 mg Lasix daily, 50 mg losartan daily, 10 mEq potassium - if pressure support needed, can consider adding spironolactone   Hypertension - not at goal given obesity and DM - will increase losartan to 100 mg - if still hypertensive with change in BB, may need to switch to valsartan or irbesartan for more BP control --> alternatively, given HFpEF and chest pain, could also consider adding spironolactone +/- imdur   Sinus tachycardia at rest - Currently on 25 mg carvedilol twice daily - TSH WNL in Nov 2021 - appears to be inappropriate ST - D-dimer negative 2021 with low suspicion for a PE - will change coreg to metoprolol 75 mg BID - she is on high dose albuterol - question if this is contributory - no near/syncope, but palpitations - may need to add corlanor if BB is not helpful   Hyperlipidemia with LDL goal less than 70 - Given her obesity, hypertension, and diabetes would prefer a lower LDL - Lipitor was recently increased to 40 mg - We will collect a repeat fasting lipid profile at next follow up   DM - Glipizide, metformin - A1c improved   Morbid obesity - she has started a walking program  - I congratulated her on her progress and encouraged to increase time walking   Follow up with Dr. Audie Box in 1 month.    Medication Adjustments/Labs and Tests Ordered: Current medicines are reviewed at length with the patient today.  Concerns regarding medicines are outlined above.  Orders Placed This Encounter  Procedures  . EKG 12-Lead  . ECHOCARDIOGRAM COMPLETE   Meds  ordered this encounter   Medications  . metoprolol tartrate 75 MG TABS    Sig: Take 50 mg by mouth 2 (two) times daily. Take 1.5 Tablets Twice Daily    Dispense:  180 tablet    Refill:  3  . isosorbide mononitrate (IMDUR) 30 MG 24 hr tablet    Sig: Take 1 tablet (30 mg total) by mouth daily.    Dispense:  90 tablet    Refill:  3    Signed, Ledora Bottcher, [place]  12/23/2020 3:06 PM    [facility]

## 2020-12-23 ENCOUNTER — Other Ambulatory Visit: Payer: Self-pay

## 2020-12-23 ENCOUNTER — Ambulatory Visit (INDEPENDENT_AMBULATORY_CARE_PROVIDER_SITE_OTHER): Payer: Self-pay | Admitting: Physician Assistant

## 2020-12-23 ENCOUNTER — Encounter: Payer: Self-pay | Admitting: Physician Assistant

## 2020-12-23 VITALS — BP 138/88 | HR 118 | Ht 67.0 in | Wt >= 6400 oz

## 2020-12-23 DIAGNOSIS — R079 Chest pain, unspecified: Secondary | ICD-10-CM

## 2020-12-23 DIAGNOSIS — I10 Essential (primary) hypertension: Secondary | ICD-10-CM

## 2020-12-23 DIAGNOSIS — I5030 Unspecified diastolic (congestive) heart failure: Secondary | ICD-10-CM

## 2020-12-23 DIAGNOSIS — E1165 Type 2 diabetes mellitus with hyperglycemia: Secondary | ICD-10-CM

## 2020-12-23 DIAGNOSIS — E785 Hyperlipidemia, unspecified: Secondary | ICD-10-CM

## 2020-12-23 DIAGNOSIS — R000 Tachycardia, unspecified: Secondary | ICD-10-CM

## 2020-12-23 MED ORDER — ISOSORBIDE MONONITRATE ER 30 MG PO TB24
30.0000 mg | ORAL_TABLET | Freq: Every day | ORAL | 3 refills | Status: DC
  Filled 2020-12-23: qty 90, 90d supply, fill #0
  Filled 2020-12-24: qty 30, 30d supply, fill #0

## 2020-12-23 MED ORDER — METOPROLOL TARTRATE 75 MG PO TABS
50.0000 mg | ORAL_TABLET | Freq: Two times a day (BID) | ORAL | 3 refills | Status: DC
  Filled 2020-12-23: qty 180, fill #0

## 2020-12-23 NOTE — Patient Instructions (Signed)
Medication Instructions:  Stop Coreg, Start Metoprolol 50 mg (Take 1.5 Tablets Twice Daily). Start Imdur 30( Take 0.5 Tablets 15 mg for 5 Days. Then Take 30 mg 1 Tablet Daily ).  *If you need a refill on your cardiac medications before your next appointment, please call your pharmacy*   Lab Work: No Labs If you have labs (blood work) drawn today and your tests are completely normal, you will receive your results only by: Marland Kitchen MyChart Message (if you have MyChart) OR . A paper copy in the mail If you have any lab test that is abnormal or we need to change your treatment, we will call you to review the results.   Testing/Procedures: [address], [address] Your physician has requested that you have an echocardiogram. Echocardiography is a painless test that uses sound waves to create images of your heart. It provides your doctor with information about the size and shape of your heart and how well your heart's chambers and valves are working. This procedure takes approximately one hour. There are no restrictions for this procedure.    Follow-Up: At [facility], you and your health needs are our priority.  As part of our continuing mission to provide you with exceptional heart care, we have created designated Provider Care Teams.  These Care Teams include your primary Cardiologist (physician) and Advanced Practice Providers (APPs -  Physician Assistants and Nurse Practitioners) who all work together to provide you with the care you need, when you need it.      Your next appointment:   1 month(s)  The format for your next appointment:   In Person  Provider:   Lennie Odor, MD

## 2020-12-24 ENCOUNTER — Other Ambulatory Visit: Payer: Self-pay | Admitting: Physician Assistant

## 2020-12-24 ENCOUNTER — Other Ambulatory Visit: Payer: Self-pay

## 2020-12-24 DIAGNOSIS — R000 Tachycardia, unspecified: Secondary | ICD-10-CM

## 2020-12-24 MED ORDER — METOPROLOL TARTRATE 75 MG PO TABS
75.0000 mg | ORAL_TABLET | Freq: Two times a day (BID) | ORAL | 3 refills | Status: DC
  Filled 2020-12-24: qty 60, 30d supply, fill #0

## 2020-12-24 MED ORDER — METOPROLOL TARTRATE 25 MG PO TABS
75.0000 mg | ORAL_TABLET | Freq: Two times a day (BID) | ORAL | 3 refills | Status: DC
  Filled 2020-12-24: qty 180, 30d supply, fill #0
  Filled 2021-04-16: qty 180, 30d supply, fill #1

## 2020-12-25 ENCOUNTER — Other Ambulatory Visit: Payer: Self-pay

## 2020-12-30 ENCOUNTER — Other Ambulatory Visit: Payer: Self-pay

## 2020-12-30 LAB — FECAL OCCULT BLOOD, IMMUNOCHEMICAL: Fecal Occult Bld: NEGATIVE

## 2021-01-07 ENCOUNTER — Other Ambulatory Visit: Payer: Self-pay

## 2021-01-08 ENCOUNTER — Other Ambulatory Visit: Payer: Self-pay

## 2021-01-22 ENCOUNTER — Other Ambulatory Visit: Payer: Self-pay

## 2021-01-22 ENCOUNTER — Ambulatory Visit (HOSPITAL_COMMUNITY): Payer: Medicaid Other | Attending: Physician Assistant

## 2021-01-22 DIAGNOSIS — R079 Chest pain, unspecified: Secondary | ICD-10-CM | POA: Insufficient documentation

## 2021-01-22 DIAGNOSIS — I10 Essential (primary) hypertension: Secondary | ICD-10-CM

## 2021-01-22 LAB — ECHOCARDIOGRAM COMPLETE
Area-P 1/2: 3.3 cm2
S' Lateral: 2.6 cm

## 2021-01-22 MED ORDER — PERFLUTREN LIPID MICROSPHERE
4.0000 mL | INTRAVENOUS | Status: AC | PRN
  Administered 2021-01-22: 4 mL via INTRAVENOUS

## 2021-01-27 ENCOUNTER — Telehealth: Payer: Self-pay | Admitting: Cardiovascular Disease

## 2021-01-27 NOTE — Telephone Encounter (Signed)
Patient stated she was responding back to [facility] message she wasn't able to do it online. Please advise

## 2021-02-18 ENCOUNTER — Other Ambulatory Visit: Payer: Self-pay

## 2021-02-18 MED FILL — Ferrous Sulfate Tab 325 MG (65 MG Elemental Fe): ORAL | 30 days supply | Qty: 60 | Fill #0 | Status: AC

## 2021-02-18 MED FILL — Potassium Chloride Tab ER 10 mEq: ORAL | 30 days supply | Qty: 30 | Fill #0 | Status: AC

## 2021-03-24 ENCOUNTER — Ambulatory Visit: Payer: Medicaid Other | Admitting: Critical Care Medicine

## 2021-03-26 IMAGING — MG DIGITAL SCREENING BILATERAL MAMMOGRAM WITH TOMO AND CAD
8 of 19 series · 8 of 40 positions shown · non-contrast
Comparison: None.

CLINICAL DATA: Screening.

EXAM:
DIGITAL SCREENING BILATERAL MAMMOGRAM WITH TOMO AND CAD

[L MLO]
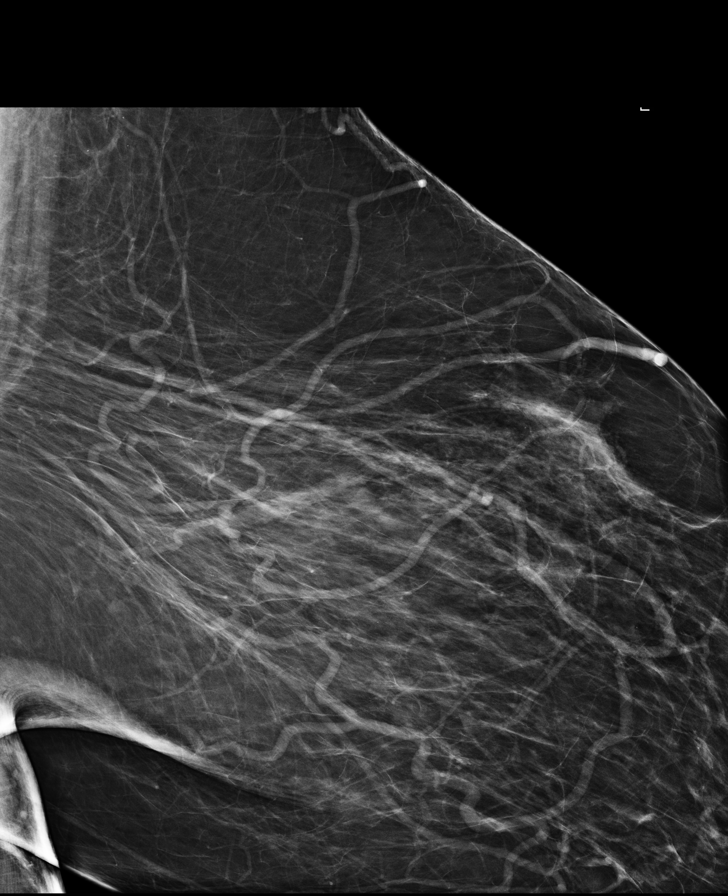

[R CC synth-2D (1 of 2)]
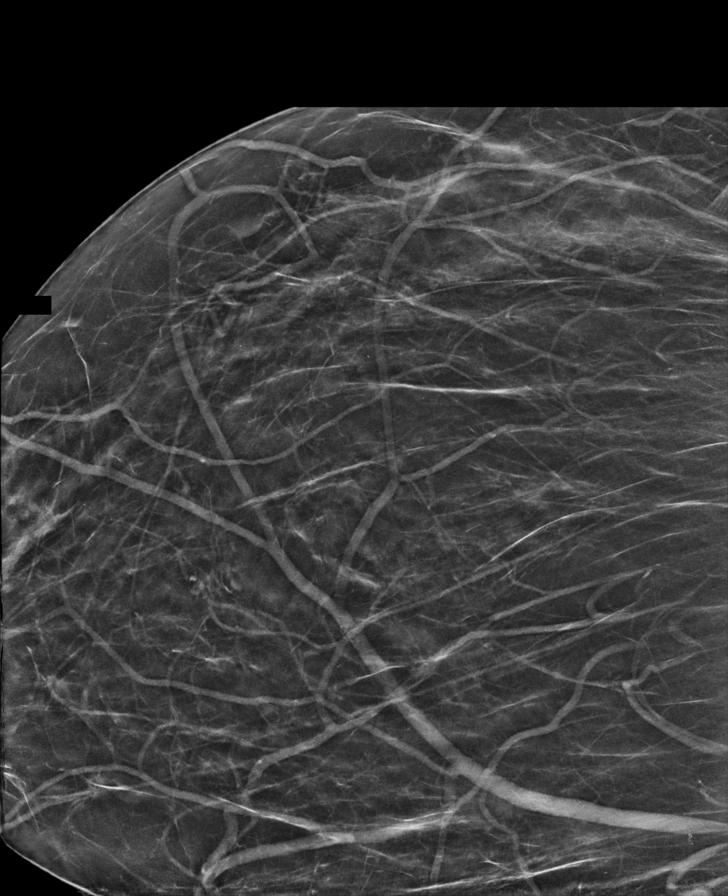

[L CC synth-2D]
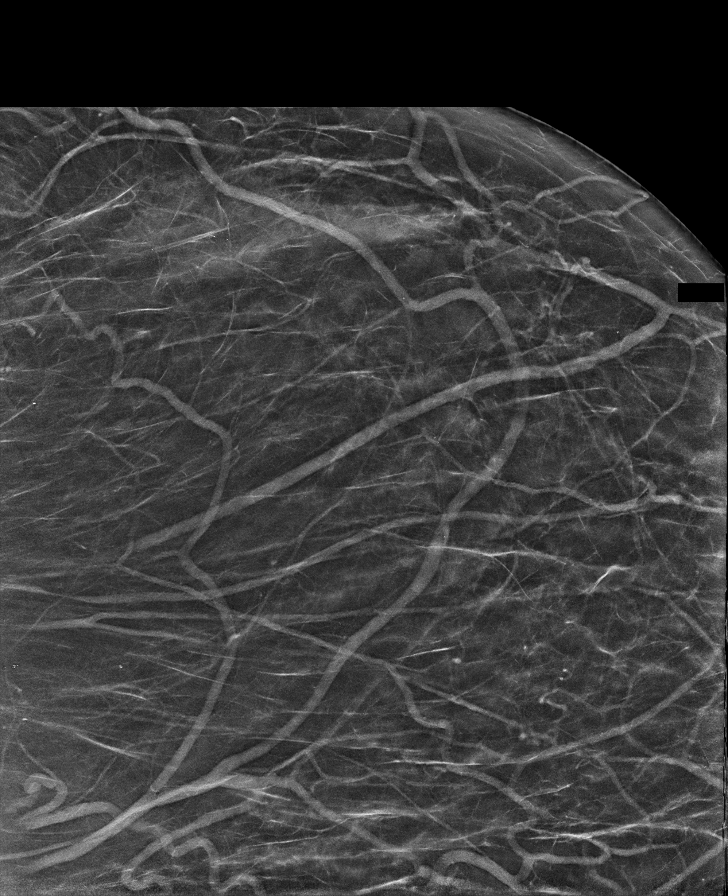

[R MLO synth-2D (1 of 2)]
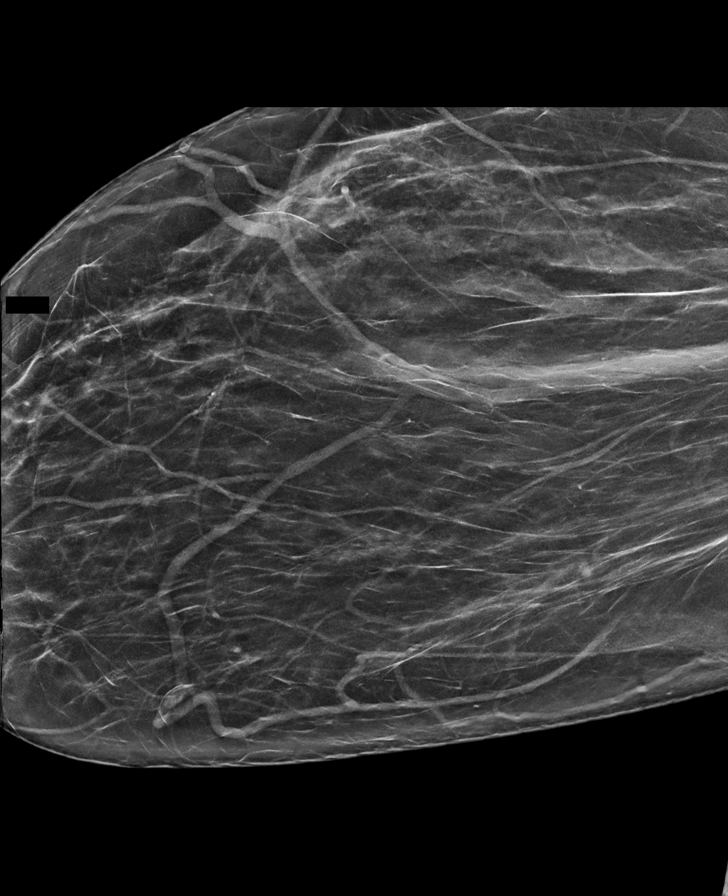

[L MLO synth-2D]
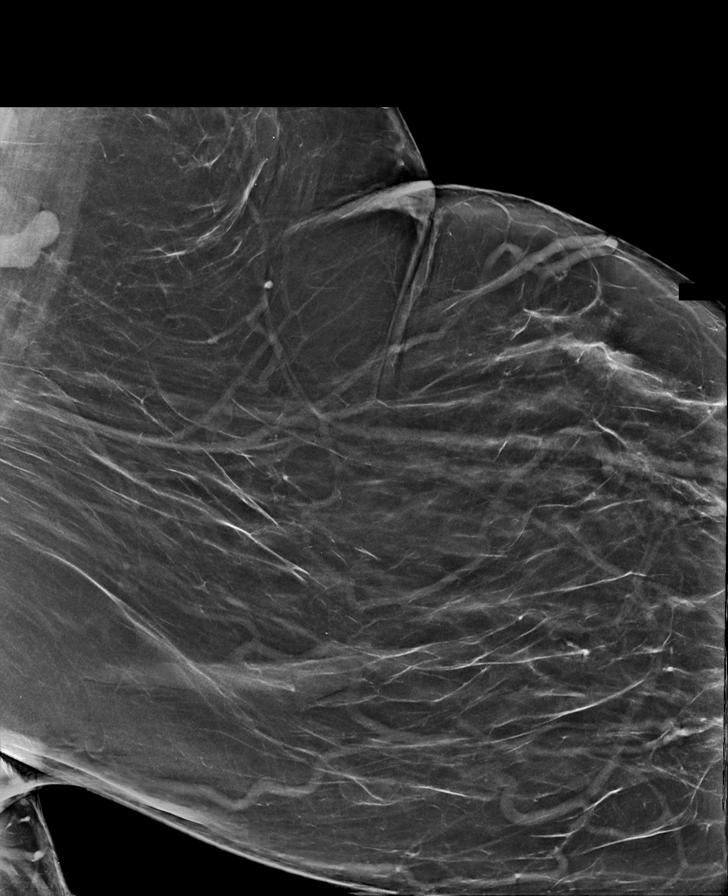

[R CC synth-2D (2 of 2)]
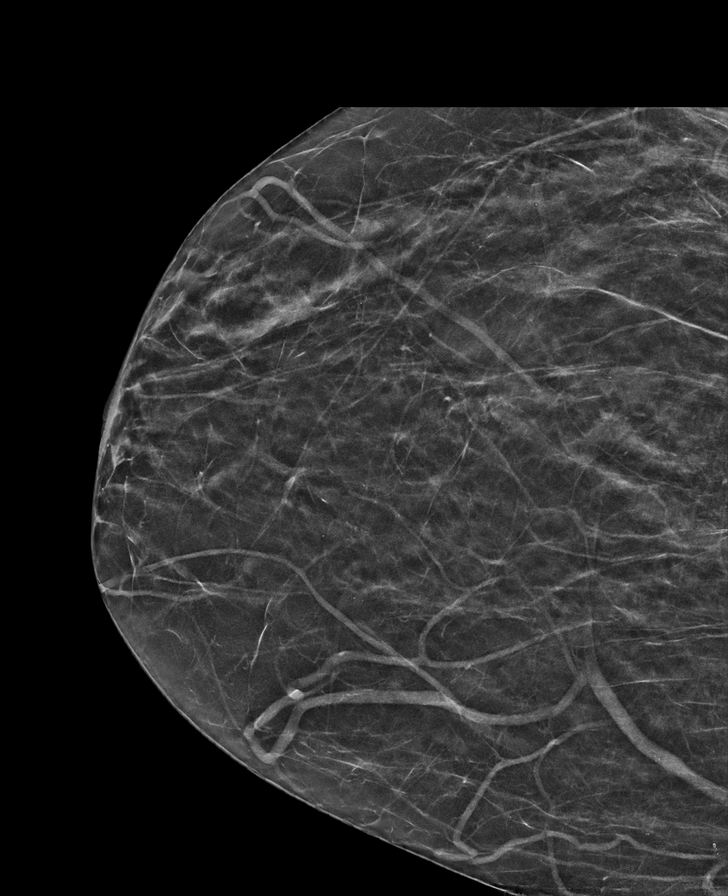

[R MLO synth-2D (2 of 2)]
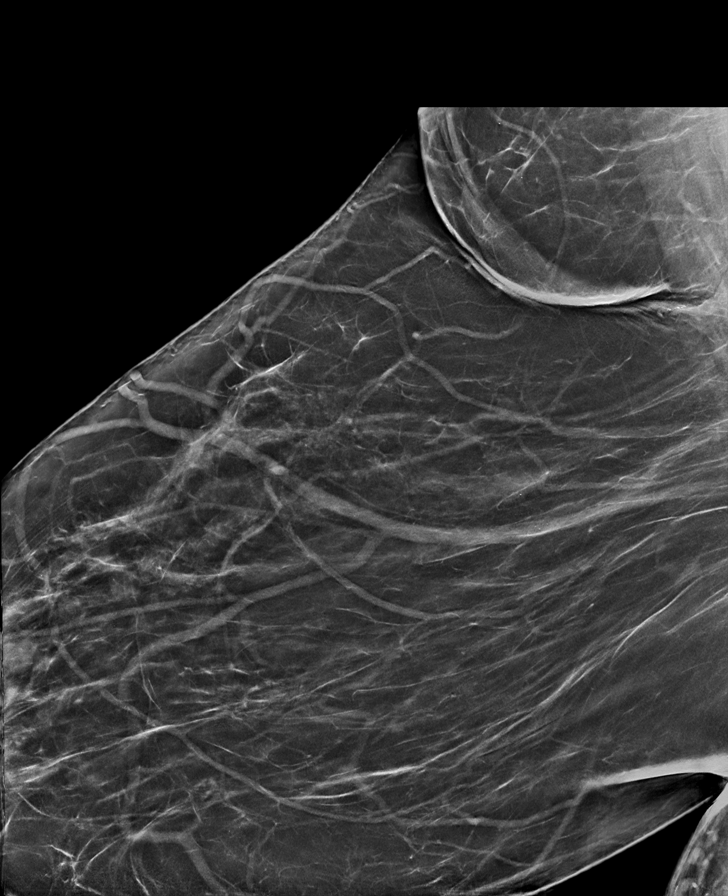

[R CC tomo · tomo slice 27/53.0]
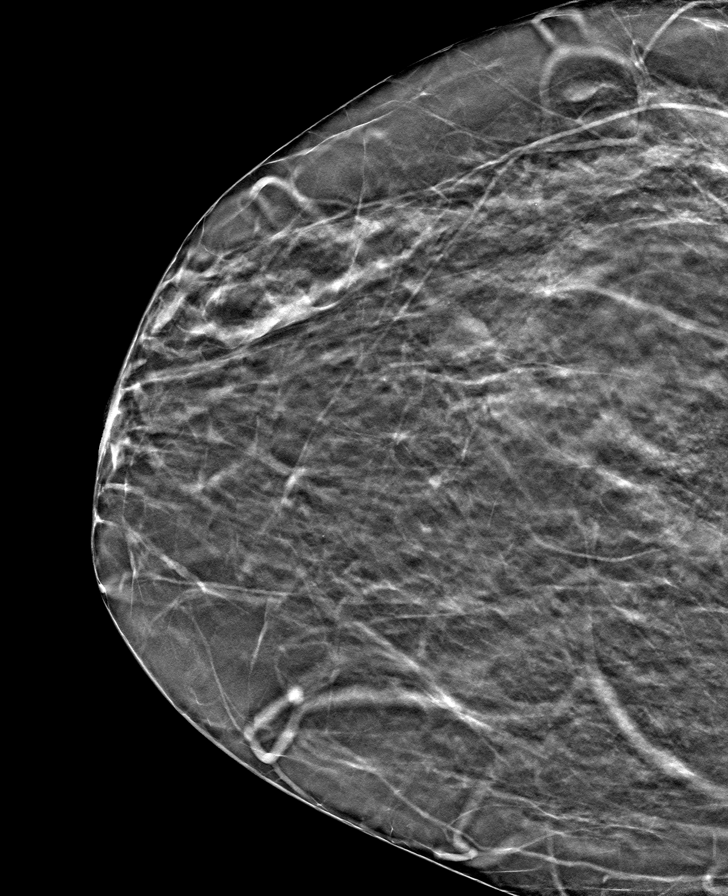

[8 of 40 positions shown; findings below may reference images not displayed]

ACR Breast Density Category b: There are scattered areas of
fibroglandular density.
FINDINGS: There are no findings suspicious for malignancy. Images were
processed with CAD.
IMPRESSION: No mammographic evidence of malignancy. A result letter of this
screening mammogram will be mailed directly to the patient.

RECOMMENDATION:
Screening mammogram in one year. (Code:Y5-G-EJ6)

BI-RADS CATEGORY  1: Negative.

## 2021-03-28 NOTE — Progress Notes (Signed)
Cardiology Office Note:   Date:  03/30/2021  NAME:  Kaylee [name]    MRN: [id number] DOB:  10/01/1970   PCP:  Elsie Stain, MD  Cardiologist:  Evalina Field, MD  Electrophysiologist:  None   Referring MD: Elsie Stain, MD   Chief Complaint  Patient presents with   Chest Pain   History of Present Illness:   Kaylee [name] is a 50 y.o. female with a hx of morbid obesity, HTN, HFpEF, HLD who presents for follow-up.  She was seen on 12/23/2020 for atypical chest pain.  She reports she is intermittent episodes of sharp central chest pain.  She reports it can last seconds.  She reports it can occur with exertion.  She can have it every few weeks.  She reports she stops what she is doing his symptoms resolved.  She does not have all the time.  She also has associated shortness of breath at times.  BMI 70.  Weight is 448.  She reports she is working on trying to lose weight.  I have recommended referral to healthy weight and wellness.  Given her young age I think she should consider weight loss surgery.  BP 140/76.  There is confusion about medications.  She should be taking losartan and not lisinopril.  She was switched to metoprolol tartrate 75 twice daily and she is taking this.  She should not be taking Imdur.  She is on Lipitor 40 mg daily.  Most recent LDL 107.  Likely needs recheck.  Her diabetes is well controlled with an A1c of 6.7.  She has no increased lower extremity edema.  She does have what I suspect is the early start of lymphedema.  She takes Lasix 20 mg daily and this is controlled.  She is watching her salt consumption.  Problem List DM -A1c 6.7  Morbid obesity -BMI 68  HLD -T chol 154, HDL 31, LDL 107, TG 81 HFpEF  Past Medical History: Past Medical History:  Diagnosis Date   Diabetes mellitus without complication (HCC)    Heavy menstrual period    Morbid obesity (HCC)     Past Surgical History: Past Surgical History:  Procedure Laterality Date   s/p of  leep cervix      Current Medications: Current Meds  Medication Sig   albuterol (VENTOLIN HFA) 108 (90 Base) MCG/ACT inhaler Inhale 2 puffs into the lungs every 6 (six) hours as needed for wheezing or shortness of breath.   atorvastatin (LIPITOR) 40 MG tablet TAKE 1 TABLET (40 MG TOTAL) BY MOUTH DAILY.   Blood Glucose Monitoring Suppl (TRUE METRIX METER) w/Device KIT Use to check blood sugar TID.   cyclobenzaprine (FLEXERIL) 10 MG tablet Take 1 tablet (10 mg total) by mouth at bedtime. Prn muscle spasms   diclofenac Sodium (VOLTAREN) 1 % GEL APPLY 2 G TOPICALLY IN THE MORNING AND AT BEDTIME. TO BOTH KNEES   ferrous sulfate 325 (65 FE) MG tablet TAKE 1 TABLET (325 MG TOTAL) BY MOUTH 2 (TWO) TIMES DAILY WITH A MEAL. MUST HAVE OFFICE VISIT FOR REFILLS   Fluticasone-Salmeterol (ADVAIR DISKUS) 250-50 MCG/DOSE AEPB Inhale 1 puff into the lungs 2 (two) times daily.   furosemide (LASIX) 20 MG tablet TAKE 1 TABLET (20 MG TOTAL) BY MOUTH DAILY.   glipiZIDE (GLUCOTROL) 5 MG tablet TAKE 1 TABLET (5 MG TOTAL) BY MOUTH 2 (TWO) TIMES DAILY BEFORE A MEAL.   glucose blood (TRUE METRIX BLOOD GLUCOSE TEST) test strip Use to check blood sugar  TID.   glucose blood test strip USE TO CHECK BLOOD SUGAR 3 TIMES DAILY   losartan (COZAAR) 50 MG tablet TAKE 1 TABLET (50 MG TOTAL) BY MOUTH DAILY.   medroxyPROGESTERone (PROVERA) 10 MG tablet TAKE 2 TABLETS (20 MG TOTAL) BY MOUTH DAILY.   meloxicam (MOBIC) 7.5 MG tablet TAKE 1-2 TABLETS DAILY AS NEEDED FOR ARTHRITIS PAIN   metFORMIN (GLUCOPHAGE) 500 MG tablet TAKE 2 TABLETS (1,000 MG TOTAL) BY MOUTH 2 (TWO) TIMES DAILY WITH A MEAL.   montelukast (SINGULAIR) 10 MG tablet TAKE 1 TABLET (10 MG TOTAL) BY MOUTH AT BEDTIME.   potassium chloride (KLOR-CON) 10 MEQ tablet TAKE 1 TABLET (10 MEQ TOTAL) BY MOUTH DAILY.   TRUEplus Lancets 28G MISC Use to check blood sugar TID.   [DISCONTINUED] albuterol (VENTOLIN HFA) 108 (90 Base) MCG/ACT inhaler INHALE 2 PUFFS INTO THE LUNGS EVERY 6  (SIX) HOURS AS NEEDED FOR WHEEZING OR SHORTNESS OF BREATH.   [DISCONTINUED] fluticasone-salmeterol (ADVAIR) 250-50 MCG/ACT AEPB Inhale 1 puff into the lungs 2 (two) times daily.     Allergies:    Patient has no known allergies.   Social History: Social History   Socioeconomic History   Marital status: Divorced    Spouse name: Not on file   Number of children: 4   Years of education: Not on file   Highest education level: 12th grade  Occupational History   Not on file  Tobacco Use   Smoking status: Never   Smokeless tobacco: Never  Vaping Use   Vaping Use: Never used  Substance and Sexual Activity   Alcohol use: Not Currently   Drug use: Not Currently   Sexual activity: Not Currently  Other Topics Concern   Not on file  Social History Narrative   Not on file   Social Determinants of Health   Financial Resource Strain: Not on file  Food Insecurity: Not on file  Transportation Needs: Not on file  Physical Activity: Not on file  Stress: Not on file  Social Connections: Not on file     Family History: The patient's family history includes Breast cancer (age of onset: 71) in her maternal grandmother; Leukemia in her mother.  ROS:   All other ROS reviewed and negative. Pertinent positives noted in the HPI.     EKGs/Labs/Other Studies Reviewed:   The following studies were personally reviewed by me today:  TTE 01/22/2021   1. Left ventricular ejection fraction, by estimation, is 70 to 75%. The  left ventricle has hyperdynamic function. The left ventricle has no  regional wall motion abnormalities. Left ventricular diastolic parameters  are indeterminate.   2. Right ventricular systolic function is normal. The right ventricular  size is normal.   3. The mitral valve is grossly normal. No evidence of mitral valve  regurgitation. No evidence of mitral stenosis.   4. The aortic valve was not well visualized. Aortic valve regurgitation  is not visualized. No aortic  stenosis is present.   5. The inferior vena cava is dilated in size with >50% respiratory  variability, suggesting right atrial pressure of 8 mmHg.   Recent Labs: 07/22/2020: ALT 18; Hemoglobin 13.3; Platelets 290; TSH 2.510 11/30/2020: BUN 9; Creatinine, Ser 0.57; Potassium 4.4; Sodium 139   Recent Lipid Panel    Component Value Date/Time   CHOL 154 07/22/2020 0907   TRIG 81 07/22/2020 0907   HDL 31 (L) 07/22/2020 0907   CHOLHDL 5.0 (H) 07/22/2020 0907   CHOLHDL 5.1 10/13/2018 9210  VLDL 19 10/13/2018 0311   LDLCALC 107 (H) 07/22/2020 0907    Physical Exam:   VS:  BP 140/76   Pulse (!) 124   Ht $R'5\' 7"'Hw$  (1.702 m)   Wt (!) 448 lb 12.8 oz (203.6 kg)   SpO2 95%   BMI 70.29 kg/m    Wt Readings from Last 3 Encounters:  03/30/21 (!) 448 lb 12.8 oz (203.6 kg)  12/23/20 (!) 438 lb (198.7 kg)  11/30/20 (!) 433 lb 12.8 oz (196.8 kg)    General: Morbidly obese female, no acute distress Head: Atraumatic, normal size  Eyes: PEERLA, EOMI  Neck: Supple, no JVD Endocrine: No thryomegaly Cardiac: Normal S1, S2; regular rate and rhythm, no murmurs Lungs: Diminished breath sounds bilaterally Abd: Soft, nontender, no hepatomegaly  Ext: Lymphedema noted, trace edema Musculoskeletal: No deformities, BUE and BLE strength normal and equal Skin: Warm and dry, no rashes   Neuro: Alert and oriented to person, place, time, and situation, CNII-XII grossly intact, no focal deficits  Psych: Normal mood and affect   ASSESSMENT:   Kaylee [name] is a 50 y.o. female who presents for the following: 1. Chest pain, unspecified type   2. Heart failure with preserved ejection fraction, unspecified HF chronicity (HCC)   3. Lymphedema   4. Essential hypertension   5. Hyperlipidemia, unspecified hyperlipidemia type   6. Obesity, morbid, BMI 50 or higher ([place])   7. Snoring   8. Chronic fatigue   9. Sinus tachycardia     PLAN:   1. Chest pain, unspecified type -Atypical chest pain.  Suspect this is  noncardiac.  She is not hypoxic.  She is tachycardic but I do not believe she has a pulmonary embolism.  She has been tachycardic for years.  She is not a candidate for coronary CTA.  Nuclear stress testing would be inaccurate.  Given that her symptoms are rather atypical I see no need for further evaluation.  She seems to be stable.  I recommended weight loss.  2. Heart failure with preserved ejection fraction, unspecified HF chronicity (HCC) -Euvolemic on exam.  Continue Lasix 20 mg daily.  Weight loss encouraged. -Echo shows normal LV function and normal RV function.  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  I suspect most of her symptoms are related to morbid obesity as well as venous insufficiency and lymphedema.  3. Lymphedema -She has early stages of lymphedema.  I recommended to continue with leg elevation as well as Lasix 20 mg daily.  4. Essential hypertension -Controlled today.  Continue metoprolol tartrate 75 twice daily.  She is on losartan 50 mg daily.  She can increase her losartan or metoprolol from her primary care physician  5. Hyperlipidemia, unspecified hyperlipidemia type -Continue statin.  Goal LDL less than 70 given her diabetes.  Needs recheck at some point.  6. Obesity, morbid, BMI 50 or higher ([place]) -Referral to healthy weight and wellness.  I think she should consider weight loss surgery given her young age.  7. Snoring 8. Chronic fatigue -She snores and is fatigued.  BMI 70.  I have recommended a home sleep study.  She reports chronic fatigue and falls asleep very easily.  9. Sinus Tachycardia -I suspect this is all secondary to her morbid obesity.  When she exerts her self she will become tachycardic.  Her echocardiogram was normal.  TSH in November 2021 was also normal.  She is not anemic.  She can continue metoprolol for now.  No strong indication to treat this.  Disposition:  Return in about 1 year (around 03/30/2022).  Medication Adjustments/Labs and Tests Ordered: Current  medicines are reviewed at length with the patient today.  Concerns regarding medicines are outlined above.  Orders Placed This Encounter  Procedures   Amb Ref to Medical Weight Management   Home sleep test    No orders of the defined types were placed in this encounter.   Patient Instructions  Medication Instructions:  The current medical regimen is effective;  continue present plan and medications.  *If you need a refill on your cardiac medications before your next appointment, please call your pharmacy*   Testing/Procedures: Your physician has recommended that you have a sleep study. This test records several body functions during sleep, including: brain activity, eye movement, oxygen and carbon dioxide blood levels, heart rate and rhythm, breathing rate and rhythm, the flow of air through your mouth and nose, snoring, body muscle movements, and chest and belly movement.   Follow-Up: At [facility], you and your health needs are our priority.  As part of our continuing mission to provide you with exceptional heart care, we have created designated Provider Care Teams.  These Care Teams include your primary Cardiologist (physician) and Advanced Practice Providers (APPs -  Physician Assistants and Nurse Practitioners) who all work together to provide you with the care you need, when you need it.  We recommend signing up for the patient portal called "MyChart".  Sign up information is provided on this After Visit Summary.  MyChart is used to connect with patients for Virtual Visits (Telemedicine).  Patients are able to view lab/test results, encounter notes, upcoming appointments, etc.  Non-urgent messages can be sent to your provider as well.   To learn more about what you can do with MyChart, go to [email or web address].    Your next appointment:   12 month(s)  The format for your next appointment:   In Person  Provider:   Eleonore Chiquito, MD   Other Instructions Referral to  [facility]- they will contact you to set up an appointment.    Time Spent with Patient: I have spent a total of 35 minutes with patient reviewing hospital notes, telemetry, EKGs, labs and examining the patient as well as establishing an assessment and plan that was discussed with the patient.  > 50% of time was spent in direct patient care.  Signed, Addison Naegeli. Audie Box, MD, [facility]  [address] [place], [place] [address] [phone]  03/30/2021 4:03 PM

## 2021-03-30 ENCOUNTER — Encounter: Payer: Self-pay | Admitting: Cardiovascular Disease

## 2021-03-30 ENCOUNTER — Other Ambulatory Visit: Payer: Self-pay

## 2021-03-30 ENCOUNTER — Ambulatory Visit (INDEPENDENT_AMBULATORY_CARE_PROVIDER_SITE_OTHER): Payer: Self-pay | Admitting: Cardiovascular Disease

## 2021-03-30 VITALS — BP 140/76 | HR 124 | Ht 67.0 in | Wt >= 6400 oz

## 2021-03-30 DIAGNOSIS — R5382 Chronic fatigue, unspecified: Secondary | ICD-10-CM

## 2021-03-30 DIAGNOSIS — I5030 Unspecified diastolic (congestive) heart failure: Secondary | ICD-10-CM

## 2021-03-30 DIAGNOSIS — R079 Chest pain, unspecified: Secondary | ICD-10-CM

## 2021-03-30 DIAGNOSIS — E785 Hyperlipidemia, unspecified: Secondary | ICD-10-CM

## 2021-03-30 DIAGNOSIS — I10 Essential (primary) hypertension: Secondary | ICD-10-CM

## 2021-03-30 DIAGNOSIS — R000 Tachycardia, unspecified: Secondary | ICD-10-CM

## 2021-03-30 DIAGNOSIS — R0683 Snoring: Secondary | ICD-10-CM

## 2021-03-30 DIAGNOSIS — I890 Lymphedema, not elsewhere classified: Secondary | ICD-10-CM

## 2021-03-30 NOTE — Patient Instructions (Signed)
Medication Instructions:  The current medical regimen is effective;  continue present plan and medications.  *If you need a refill on your cardiac medications before your next appointment, please call your pharmacy*   Testing/Procedures: Your physician has recommended that you have a sleep study. This test records several body functions during sleep, including: brain activity, eye movement, oxygen and carbon dioxide blood levels, heart rate and rhythm, breathing rate and rhythm, the flow of air through your mouth and nose, snoring, body muscle movements, and chest and belly movement.   Follow-Up: At [facility], you and your health needs are our priority.  As part of our continuing mission to provide you with exceptional heart care, we have created designated Provider Care Teams.  These Care Teams include your primary Cardiologist (physician) and Advanced Practice Providers (APPs -  Physician Assistants and Nurse Practitioners) who all work together to provide you with the care you need, when you need it.  We recommend signing up for the patient portal called "MyChart".  Sign up information is provided on this After Visit Summary.  MyChart is used to connect with patients for Virtual Visits (Telemedicine).  Patients are able to view lab/test results, encounter notes, upcoming appointments, etc.  Non-urgent messages can be sent to your provider as well.   To learn more about what you can do with MyChart, go to [email or web address].    Your next appointment:   12 month(s)  The format for your next appointment:   In Person  Provider:   Lennie Odor, MD   Other Instructions Referral to [facility]- they will contact you to set up an appointment.

## 2021-04-16 ENCOUNTER — Other Ambulatory Visit: Payer: Self-pay

## 2021-04-16 MED FILL — Glipizide Tab 5 MG: ORAL | 30 days supply | Qty: 60 | Fill #0 | Status: AC

## 2021-04-16 MED FILL — Furosemide Tab 20 MG: ORAL | 30 days supply | Qty: 30 | Fill #0 | Status: AC

## 2021-04-16 MED FILL — Ferrous Sulfate Tab 325 MG (65 MG Elemental Fe): ORAL | 30 days supply | Qty: 60 | Fill #1 | Status: AC

## 2021-04-16 MED FILL — Atorvastatin Calcium Tab 40 MG (Base Equivalent): ORAL | 90 days supply | Qty: 90 | Fill #0 | Status: AC

## 2021-04-19 ENCOUNTER — Other Ambulatory Visit: Payer: Self-pay

## 2021-05-26 ENCOUNTER — Other Ambulatory Visit: Payer: Self-pay | Admitting: Pharmacist

## 2021-05-26 ENCOUNTER — Other Ambulatory Visit: Payer: Self-pay

## 2021-05-26 DIAGNOSIS — J4540 Moderate persistent asthma, uncomplicated: Secondary | ICD-10-CM

## 2021-05-26 MED ORDER — ALBUTEROL SULFATE HFA 108 (90 BASE) MCG/ACT IN AERS
2.0000 | INHALATION_SPRAY | Freq: Four times a day (QID) | RESPIRATORY_TRACT | 0 refills | Status: AC | PRN
  Filled 2021-05-26: qty 18, 25d supply, fill #0

## 2021-05-26 MED FILL — Potassium Chloride Tab ER 10 mEq: ORAL | 30 days supply | Qty: 30 | Fill #1 | Status: AC

## 2021-05-26 MED FILL — Glipizide Tab 5 MG: ORAL | 30 days supply | Qty: 60 | Fill #1 | Status: AC

## 2021-05-26 MED FILL — Furosemide Tab 20 MG: ORAL | 30 days supply | Qty: 30 | Fill #1 | Status: AC

## 2021-05-27 ENCOUNTER — Other Ambulatory Visit: Payer: Self-pay

## 2021-05-28 ENCOUNTER — Other Ambulatory Visit: Payer: Self-pay

## 2021-06-01 ENCOUNTER — Encounter: Payer: Self-pay | Admitting: Critical Care Medicine

## 2021-06-01 ENCOUNTER — Other Ambulatory Visit: Payer: Self-pay

## 2021-06-01 ENCOUNTER — Ambulatory Visit: Payer: Medicaid Other | Attending: Critical Care Medicine | Admitting: Critical Care Medicine

## 2021-06-01 ENCOUNTER — Other Ambulatory Visit: Payer: Self-pay | Admitting: Pharmacist

## 2021-06-01 VITALS — BP 138/83 | HR 92 | Resp 16 | Wt >= 6400 oz

## 2021-06-01 DIAGNOSIS — Z6841 Body Mass Index (BMI) 40.0 and over, adult: Secondary | ICD-10-CM

## 2021-06-01 DIAGNOSIS — D509 Iron deficiency anemia, unspecified: Secondary | ICD-10-CM

## 2021-06-01 DIAGNOSIS — E1165 Type 2 diabetes mellitus with hyperglycemia: Secondary | ICD-10-CM

## 2021-06-01 DIAGNOSIS — Z23 Encounter for immunization: Secondary | ICD-10-CM

## 2021-06-01 DIAGNOSIS — I5030 Unspecified diastolic (congestive) heart failure: Secondary | ICD-10-CM

## 2021-06-01 DIAGNOSIS — E782 Mixed hyperlipidemia: Secondary | ICD-10-CM

## 2021-06-01 DIAGNOSIS — J4540 Moderate persistent asthma, uncomplicated: Secondary | ICD-10-CM

## 2021-06-01 DIAGNOSIS — I10 Essential (primary) hypertension: Secondary | ICD-10-CM

## 2021-06-01 LAB — POCT GLYCOSYLATED HEMOGLOBIN (HGB A1C): HbA1c, POC (controlled diabetic range): 6.9 % (ref 0.0–7.0)

## 2021-06-01 LAB — GLUCOSE, POCT (MANUAL RESULT ENTRY): POC Glucose: 128 mg/dl — AB (ref 70–99)

## 2021-06-01 MED ORDER — TRUE METRIX BLOOD GLUCOSE TEST VI STRP
ORAL_STRIP | 11 refills | Status: AC
  Filled 2021-06-01 – 2022-04-29 (×2): qty 100, 33d supply, fill #0

## 2021-06-01 MED ORDER — MEDROXYPROGESTERONE ACETATE 10 MG PO TABS
ORAL_TABLET | Freq: Every day | ORAL | 1 refills | Status: DC
  Filled 2021-06-01: qty 60, 30d supply, fill #0

## 2021-06-01 MED ORDER — FLUTICASONE-SALMETEROL 250-50 MCG/ACT IN AEPB
1.0000 | INHALATION_SPRAY | Freq: Two times a day (BID) | RESPIRATORY_TRACT | 6 refills | Status: DC
  Filled 2021-06-01 (×2): qty 60, 30d supply, fill #0

## 2021-06-01 MED ORDER — POTASSIUM CHLORIDE ER 10 MEQ PO TBCR
EXTENDED_RELEASE_TABLET | Freq: Every day | ORAL | 5 refills | Status: DC
  Filled 2021-06-01: qty 30, fill #0
  Filled 2021-09-02 – 2021-09-22 (×2): qty 30, 30d supply, fill #0

## 2021-06-01 MED ORDER — LOSARTAN POTASSIUM 50 MG PO TABS
50.0000 mg | ORAL_TABLET | Freq: Every day | ORAL | 1 refills | Status: DC
  Filled 2021-06-01 – 2021-09-22 (×2): qty 30, 30d supply, fill #0

## 2021-06-01 MED ORDER — DAPAGLIFLOZIN PROPANEDIOL 10 MG PO TABS
10.0000 mg | ORAL_TABLET | Freq: Every day | ORAL | 6 refills | Status: DC
  Filled 2021-06-01: qty 30, 30d supply, fill #0
  Filled 2021-09-02: qty 30, 30d supply, fill #1
  Filled 2021-09-22: qty 30, 30d supply, fill #0

## 2021-06-01 MED ORDER — TRUEPLUS LANCETS 28G MISC
11 refills | Status: AC
Start: 2021-06-01 — End: ?
  Filled 2021-06-01 – 2022-04-29 (×3): qty 100, 33d supply, fill #0

## 2021-06-01 MED ORDER — MONTELUKAST SODIUM 10 MG PO TABS
10.0000 mg | ORAL_TABLET | Freq: Every day | ORAL | 1 refills | Status: AC
  Filled 2021-06-01 – 2021-09-02 (×2): qty 30, 30d supply, fill #0
  Filled 2022-03-10 – 2022-04-29 (×2): qty 90, 90d supply, fill #0

## 2021-06-01 MED ORDER — GLIPIZIDE 5 MG PO TABS
ORAL_TABLET | Freq: Two times a day (BID) | ORAL | 3 refills | Status: DC
  Filled 2021-06-01: qty 60, fill #0
  Filled 2021-09-22: qty 60, 30d supply, fill #0

## 2021-06-01 MED ORDER — METOPROLOL TARTRATE 25 MG PO TABS
75.0000 mg | ORAL_TABLET | Freq: Two times a day (BID) | ORAL | 1 refills | Status: DC
Start: 2021-06-01 — End: 2022-03-10
  Filled 2021-06-01 – 2021-09-22 (×2): qty 180, 30d supply, fill #0

## 2021-06-01 MED ORDER — GLUCOSE BLOOD VI STRP
ORAL_STRIP | 11 refills | Status: AC
  Filled 2021-06-01 – 2022-03-10 (×2): qty 100, 33d supply, fill #0

## 2021-06-01 MED ORDER — FLUTICASONE-SALMETEROL 250-50 MCG/ACT IN AEPB
1.0000 | INHALATION_SPRAY | Freq: Two times a day (BID) | RESPIRATORY_TRACT | 6 refills | Status: DC
  Filled 2021-06-01: qty 1, 1d supply, fill #0

## 2021-06-01 MED ORDER — FUROSEMIDE 20 MG PO TABS
ORAL_TABLET | Freq: Every day | ORAL | 5 refills | Status: DC
  Filled 2021-06-01: qty 30, fill #0
  Filled 2021-09-22: qty 30, 30d supply, fill #0

## 2021-06-01 MED ORDER — FERROUS SULFATE 325 (65 FE) MG PO TABS
ORAL_TABLET | ORAL | 5 refills | Status: DC
  Filled 2021-06-01: qty 60, 30d supply, fill #0

## 2021-06-01 NOTE — Assessment & Plan Note (Signed)
Recheck lipid panel 

## 2021-06-01 NOTE — Assessment & Plan Note (Signed)
As per heart failure with preserved ejection fraction assessment

## 2021-06-01 NOTE — Patient Instructions (Addendum)
Call the cancer control program to apply for free mammogram and pap smear, they will order the studies once approved The number is:   [phone]   Flu vaccine was given  Complete set of lab draws obtained  Refills on all your medications sent to our pharmacy  Discontinue metformin and begin Farxiga 10 mg daily  Continue glipizide twice daily  Return to see Dr. Delford Field 4 months  Once you complete and obtain the [place] discount letter you can then get the home sleep study  Follow healthy diet as attached

## 2021-06-01 NOTE — Progress Notes (Signed)
Established Patient Office Visit  Subjective:  Patient ID: Kaylee [name], female    DOB: Jan 29, 1971  Age: 50 y.o. MRN: [id number]  CC:  Chief Complaint  Patient presents with   Diabetes    HPI Kaylee [name] presents for primary care follow-up.  On arrival blood pressure is 138/83 blood sugar 128 A1c 6.9.  Patient's blood sugar at home has been improved.  She complains of some lower extremity edema.  She is in the process of applying to get her GED.  Patient denies any chest pain or significant shortness of breath.  She is maintained on Advair Singulair as needed albuterol.  No other complaints at this visit.   Past Medical History:  Diagnosis Date   Diabetes mellitus without complication (HCC)    Heavy menstrual period    Morbid obesity (HCC)     Past Surgical History:  Procedure Laterality Date   s/p of leep cervix      Family History  Problem Relation Age of Onset   Leukemia Mother    Breast cancer Maternal Grandmother 34    Social History   Socioeconomic History   Marital status: Divorced    Spouse name: Not on file   Number of children: 4   Years of education: Not on file   Highest education level: 12th grade  Occupational History   Not on file  Tobacco Use   Smoking status: Never   Smokeless tobacco: Never  Vaping Use   Vaping Use: Never used  Substance and Sexual Activity   Alcohol use: Not Currently   Drug use: Not Currently   Sexual activity: Not Currently  Other Topics Concern   Not on file  Social History Narrative   Not on file   Social Determinants of Health   Financial Resource Strain: Not on file  Food Insecurity: Not on file  Transportation Needs: Not on file  Physical Activity: Not on file  Stress: Not on file  Social Connections: Not on file  Intimate Partner Violence: Not on file    Outpatient Medications Prior to Visit  Medication Sig Dispense Refill   albuterol (VENTOLIN HFA) 108 (90 Base) MCG/ACT inhaler Inhale 2  puffs into the lungs every 6 (six) hours as needed for wheezing or shortness of breath. 18 g 0   atorvastatin (LIPITOR) 40 MG tablet TAKE 1 TABLET (40 MG TOTAL) BY MOUTH DAILY. 90 tablet 1   Blood Glucose Monitoring Suppl (TRUE METRIX METER) w/Device KIT Use to check blood sugar TID. 1 kit 0   cyclobenzaprine (FLEXERIL) 10 MG tablet Take 1 tablet (10 mg total) by mouth at bedtime. Prn muscle spasms 20 tablet 0   diclofenac Sodium (VOLTAREN) 1 % GEL APPLY 2 G TOPICALLY IN THE MORNING AND AT BEDTIME. TO BOTH KNEES 50 g 1   ferrous sulfate 325 (65 FE) MG tablet TAKE 1 TABLET (325 MG TOTAL) BY MOUTH 2 (TWO) TIMES DAILY WITH A MEAL. MUST HAVE OFFICE VISIT FOR REFILLS 60 tablet 5   Fluticasone-Salmeterol (ADVAIR DISKUS) 250-50 MCG/DOSE AEPB Inhale 1 puff into the lungs 2 (two) times daily. 1 each 3   furosemide (LASIX) 20 MG tablet TAKE 1 TABLET (20 MG TOTAL) BY MOUTH DAILY. 30 tablet 5   glipiZIDE (GLUCOTROL) 5 MG tablet TAKE 1 TABLET (5 MG TOTAL) BY MOUTH 2 (TWO) TIMES DAILY BEFORE A MEAL. 60 tablet 3   glucose blood (TRUE METRIX BLOOD GLUCOSE TEST) test strip Use to check blood sugar TID. 100 each 11  glucose blood test strip USE TO CHECK BLOOD SUGAR 3 TIMES DAILY 100 strip 11   losartan (COZAAR) 50 MG tablet TAKE 1 TABLET (50 MG TOTAL) BY MOUTH DAILY. 90 tablet 1   medroxyPROGESTERone (PROVERA) 10 MG tablet TAKE 2 TABLETS (20 MG TOTAL) BY MOUTH DAILY. 60 tablet 1   metFORMIN (GLUCOPHAGE) 500 MG tablet TAKE 2 TABLETS (1,000 MG TOTAL) BY MOUTH 2 (TWO) TIMES DAILY WITH A MEAL. 120 tablet 5   montelukast (SINGULAIR) 10 MG tablet TAKE 1 TABLET (10 MG TOTAL) BY MOUTH AT BEDTIME. 90 tablet 1   potassium chloride (KLOR-CON) 10 MEQ tablet TAKE 1 TABLET (10 MEQ TOTAL) BY MOUTH DAILY. 30 tablet 5   TRUEplus Lancets 28G MISC Use to check blood sugar TID. 100 each 11   meloxicam (MOBIC) 7.5 MG tablet TAKE 1-2 TABLETS DAILY AS NEEDED FOR ARTHRITIS PAIN 90 tablet 1   metoprolol tartrate (LOPRESSOR) 25 MG tablet  Take 3 tablets (75 mg total) by mouth 2 (two) times daily. 540 tablet 3   No facility-administered medications prior to visit.    No Known Allergies  ROS Review of Systems  Constitutional: Negative.   HENT: Negative.  Negative for ear pain, postnasal drip, rhinorrhea, sinus pressure, sore throat, trouble swallowing and voice change.   Eyes: Negative.   Respiratory: Negative.  Negative for apnea, cough, choking, chest tightness, shortness of breath, wheezing and stridor.   Cardiovascular: Negative.  Negative for chest pain, palpitations and leg swelling.  Gastrointestinal: Negative.  Negative for abdominal distention, abdominal pain, nausea and vomiting.  Genitourinary: Negative.   Musculoskeletal: Negative.  Negative for arthralgias and myalgias.  Skin: Negative.  Negative for rash.  Allergic/Immunologic: Negative.  Negative for environmental allergies and food allergies.  Neurological: Negative.  Negative for dizziness, syncope, weakness and headaches.  Hematological: Negative.  Negative for adenopathy. Does not bruise/bleed easily.  Psychiatric/Behavioral: Negative.  Negative for agitation and sleep disturbance. The patient is not nervous/anxious.      Objective:    Physical Exam Vitals reviewed.  Constitutional:      Appearance: Normal appearance. She is well-developed. She is obese. She is not diaphoretic.     Comments: Morbidly obese  HENT:     Head: Normocephalic and atraumatic.     Nose: No nasal deformity, septal deviation, mucosal edema or rhinorrhea.     Right Sinus: No maxillary sinus tenderness or frontal sinus tenderness.     Left Sinus: No maxillary sinus tenderness or frontal sinus tenderness.     Mouth/Throat:     Pharynx: No oropharyngeal exudate.  Eyes:     General: No scleral icterus.    Conjunctiva/sclera: Conjunctivae normal.     Pupils: Pupils are equal, round, and reactive to light.  Neck:     Thyroid: No thyromegaly.     Vascular: No carotid bruit  or JVD.     Trachea: Trachea normal. No tracheal tenderness or tracheal deviation.  Cardiovascular:     Rate and Rhythm: Normal rate and regular rhythm.     Chest Wall: PMI is not displaced.     Pulses: Normal pulses. No decreased pulses.     Heart sounds: Normal heart sounds, S1 normal and S2 normal. Heart sounds not distant. No murmur heard. No systolic murmur is present.  No diastolic murmur is present.    No friction rub. No gallop. No S3 or S4 sounds.  Pulmonary:     Effort: No tachypnea, accessory muscle usage or respiratory distress.  Breath sounds: No stridor. No decreased breath sounds, wheezing, rhonchi or rales.  Chest:     Chest wall: No tenderness.  Abdominal:     General: Bowel sounds are normal. There is no distension.     Palpations: Abdomen is soft. Abdomen is not rigid.     Tenderness: There is no abdominal tenderness. There is no guarding or rebound.  Musculoskeletal:        General: Normal range of motion.     Cervical back: Normal range of motion and neck supple. No edema, erythema or rigidity. No muscular tenderness. Normal range of motion.  Lymphadenopathy:     Head:     Right side of head: No submental or submandibular adenopathy.     Left side of head: No submental or submandibular adenopathy.     Cervical: No cervical adenopathy.  Skin:    General: Skin is warm and dry.     Coloration: Skin is not pale.     Findings: No rash.     Nails: There is no clubbing.  Neurological:     Mental Status: She is alert and oriented to person, place, and time.     Sensory: No sensory deficit.  Psychiatric:        Speech: Speech normal.        Behavior: Behavior normal.    BP 138/83   Pulse 92   Resp 16   Wt (!) 454 lb 6.4 oz (206.1 kg)   SpO2 94%   BMI 71.17 kg/m  Wt Readings from Last 3 Encounters:  06/01/21 (!) 454 lb 6.4 oz (206.1 kg)  03/30/21 (!) 448 lb 12.8 oz (203.6 kg)  12/23/20 (!) 438 lb (198.7 kg)     Health Maintenance Due  Topic Date  Due   Zoster Vaccines- Shingrix (1 of 2) Never done   MAMMOGRAM  04/15/2021    There are no preventive care reminders to display for this patient.  Lab Results  Component Value Date   TSH 2.510 07/22/2020   Lab Results  Component Value Date   WBC 9.1 07/22/2020   HGB 13.3 07/22/2020   HCT 41.6 07/22/2020   MCV 94 07/22/2020   PLT 290 07/22/2020   Lab Results  Component Value Date   NA 139 11/30/2020   K 4.4 11/30/2020   CO2 21 11/30/2020   GLUCOSE 145 (H) 11/30/2020   BUN 9 11/30/2020   CREATININE 0.57 11/30/2020   BILITOT <0.2 07/22/2020   ALKPHOS 116 07/22/2020   AST 17 07/22/2020   ALT 18 07/22/2020   PROT 8.0 07/22/2020   ALBUMIN 3.7 (L) 07/22/2020   CALCIUM 11.4 (H) 11/30/2020   ANIONGAP 9 10/12/2018   EGFR 111 11/30/2020   Lab Results  Component Value Date   CHOL 154 07/22/2020   Lab Results  Component Value Date   HDL 31 (L) 07/22/2020   Lab Results  Component Value Date   LDLCALC 107 (H) 07/22/2020   Lab Results  Component Value Date   TRIG 81 07/22/2020   Lab Results  Component Value Date   CHOLHDL 5.0 (H) 07/22/2020   Lab Results  Component Value Date   HGBA1C 6.9 06/01/2021      Assessment & Plan:   Problem List Items Addressed This Visit       Cardiovascular and Mediastinum   Heart failure with preserved ejection fraction, unspecified HF chronicity ([place])    Compensated heart failure  Plan to start Farxiga 10 mg daily Continue furosemide potassium  and losartan      Relevant Medications   furosemide (LASIX) 20 MG tablet   losartan (COZAAR) 50 MG tablet   metoprolol tartrate (LOPRESSOR) 25 MG tablet   Essential hypertension    As per heart failure with preserved ejection fraction assessment      Relevant Medications   furosemide (LASIX) 20 MG tablet   losartan (COZAAR) 50 MG tablet   metoprolol tartrate (LOPRESSOR) 25 MG tablet     Respiratory   Moderate persistent asthma without complication    Moderate persistent  asthma continue inhaled medications and Singulair      Relevant Medications   montelukast (SINGULAIR) 10 MG tablet     Endocrine   Type 2 diabetes mellitus with hyperglycemia, without long-term current use of insulin (HCC) - Primary    GI side effects of metformin we will discontinue metformin continue glipizide and begin Farxiga 10 mg daily      Relevant Medications   glipiZIDE (GLUCOTROL) 5 MG tablet   glucose blood (TRUE METRIX BLOOD GLUCOSE TEST) test strip   losartan (COZAAR) 50 MG tablet   TRUEplus Lancets 28G MISC   dapagliflozin propanediol (FARXIGA) 10 MG TABS tablet   Other Relevant Orders   POCT glucose (manual entry) (Completed)   POCT glycosylated hemoglobin (Hb A1C) (Completed)   Comprehensive metabolic panel     Other   Microcytic anemia    Continue iron supplementation      Relevant Medications   ferrous sulfate 325 (65 FE) MG tablet   Other Relevant Orders   CBC with Differential/Platelet   Influenza vaccine needed    Received flu vaccine this visit      Obesity, morbid, BMI 50 or higher (HCC)    Dietary education given      Relevant Medications   glipiZIDE (GLUCOTROL) 5 MG tablet   dapagliflozin propanediol (FARXIGA) 10 MG TABS tablet   Hyperlipidemia    Recheck lipid panel      Relevant Medications   furosemide (LASIX) 20 MG tablet   losartan (COZAAR) 50 MG tablet   metoprolol tartrate (LOPRESSOR) 25 MG tablet   Other Relevant Orders   Lipid panel   Other Visit Diagnoses     Need for immunization against influenza       Relevant Orders   Flu Vaccine QUAD 72mo+IM (Fluarix, Fluzone & Alfiuria Quad PF) (Completed)       Meds ordered this encounter  Medications   furosemide (LASIX) 20 MG tablet    Sig: TAKE 1 TABLET (20 MG TOTAL) BY MOUTH DAILY.    Dispense:  30 tablet    Refill:  5   glipiZIDE (GLUCOTROL) 5 MG tablet    Sig: TAKE 1 TABLET (5 MG TOTAL) BY MOUTH 2 (TWO) TIMES DAILY BEFORE A MEAL.    Dispense:  60 tablet    Refill:  3    glucose blood (TRUE METRIX BLOOD GLUCOSE TEST) test strip    Sig: Use to check blood sugar 3 times daily.    Dispense:  100 each    Refill:  11   glucose blood test strip    Sig: USE TO CHECK BLOOD SUGAR 3 TIMES DAILY    Dispense:  100 strip    Refill:  11   losartan (COZAAR) 50 MG tablet    Sig: TAKE 1 TABLET (50 MG TOTAL) BY MOUTH DAILY.    Dispense:  90 tablet    Refill:  1   medroxyPROGESTERone (PROVERA) 10 MG tablet  Sig: TAKE 2 TABLETS (20 MG TOTAL) BY MOUTH DAILY.    Dispense:  60 tablet    Refill:  1   montelukast (SINGULAIR) 10 MG tablet    Sig: TAKE 1 TABLET (10 MG TOTAL) BY MOUTH AT BEDTIME.    Dispense:  90 tablet    Refill:  1   potassium chloride (KLOR-CON) 10 MEQ tablet    Sig: TAKE 1 TABLET (10 MEQ TOTAL) BY MOUTH DAILY.    Dispense:  30 tablet    Refill:  5   TRUEplus Lancets 28G MISC    Sig: Use to check blood sugar 3 times daily.    Dispense:  100 each    Refill:  11   metoprolol tartrate (LOPRESSOR) 25 MG tablet    Sig: Take 3 tablets (75 mg total) by mouth 2 (two) times daily.    Dispense:  360 tablet    Refill:  1   ferrous sulfate 325 (65 FE) MG tablet    Sig: TAKE 1 TABLET (325 MG TOTAL) BY MOUTH 2 (TWO) TIMES DAILY WITH A MEAL. MUST HAVE OFFICE VISIT FOR REFILLS    Dispense:  60 tablet    Refill:  5   DISCONTD: fluticasone-salmeterol (ADVAIR) 250-50 MCG/ACT AEPB    Sig: Inhale 1 puff into the lungs 2 (two) times daily.    Dispense:  1 each    Refill:  6   dapagliflozin propanediol (FARXIGA) 10 MG TABS tablet    Sig: Take 1 tablet (10 mg total) by mouth daily before breakfast.    Dispense:  30 tablet    Refill:  6    pass     Follow-up: Return in about 4 months (around 10/01/2021).    Asencion Noble, MD

## 2021-06-01 NOTE — Assessment & Plan Note (Signed)
Continue iron supplementation

## 2021-06-01 NOTE — Assessment & Plan Note (Signed)
Received flu vaccine this visit

## 2021-06-01 NOTE — Assessment & Plan Note (Signed)
Moderate persistent asthma continue inhaled medications and Singulair

## 2021-06-01 NOTE — Assessment & Plan Note (Signed)
Dietary education given 

## 2021-06-01 NOTE — Progress Notes (Signed)
Patient states that sometime her legs get swollen.

## 2021-06-01 NOTE — Assessment & Plan Note (Signed)
Compensated heart failure  Plan to start Farxiga 10 mg daily Continue furosemide potassium and losartan

## 2021-06-01 NOTE — Assessment & Plan Note (Signed)
GI side effects of metformin we will discontinue metformin continue glipizide and begin Farxiga 10 mg daily

## 2021-06-02 ENCOUNTER — Other Ambulatory Visit: Payer: Self-pay

## 2021-06-02 LAB — COMPREHENSIVE METABOLIC PANEL
ALT: 20 IU/L (ref 0–32)
AST: 22 IU/L (ref 0–40)
Albumin/Globulin Ratio: 0.9 — ABNORMAL LOW (ref 1.2–2.2)
Albumin: 3.7 g/dL — ABNORMAL LOW (ref 3.8–4.8)
Alkaline Phosphatase: 121 IU/L (ref 44–121)
BUN/Creatinine Ratio: 7 — ABNORMAL LOW (ref 9–23)
BUN: 4 mg/dL — ABNORMAL LOW (ref 6–24)
Bilirubin Total: <0.2 mg/dL (ref 0.0–1.2)
CO2: 23 mmol/L (ref 20–29)
Calcium: 10.7 mg/dL — ABNORMAL HIGH (ref 8.7–10.2)
Chloride: 101 mmol/L (ref 96–106)
Creatinine, Ser: 0.54 mg/dL — ABNORMAL LOW (ref 0.57–1.00)
Globulin, Total: 4.2 g/dL (ref 1.5–4.5)
Glucose: 127 mg/dL — ABNORMAL HIGH (ref 70–99)
Potassium: 4.5 mmol/L (ref 3.5–5.2)
Sodium: 137 mmol/L (ref 134–144)
Total Protein: 7.9 g/dL (ref 6.0–8.5)
eGFR: 112 mL/min/{1.73_m2} (ref >59)

## 2021-06-02 LAB — CBC WITH DIFFERENTIAL/PLATELET
Basophils Absolute: 0.1 10*3/uL (ref 0.0–0.2)
Basos: 1 %
EOS (ABSOLUTE): 0.2 10*3/uL (ref 0.0–0.4)
Eos: 2 %
Hematocrit: 40.6 % (ref 34.0–46.6)
Hemoglobin: 13.3 g/dL (ref 11.1–15.9)
Immature Grans (Abs): 0 10*3/uL (ref 0.0–0.1)
Immature Granulocytes: 0 %
Lymphocytes Absolute: 3.5 10*3/uL — ABNORMAL HIGH (ref 0.7–3.1)
Lymphs: 43 %
MCH: 29 pg (ref 26.6–33.0)
MCHC: 32.8 g/dL (ref 31.5–35.7)
MCV: 89 fL (ref 79–97)
Monocytes Absolute: 0.6 10*3/uL (ref 0.1–0.9)
Monocytes: 8 %
Neutrophils Absolute: 3.8 10*3/uL (ref 1.4–7.0)
Neutrophils: 46 %
Platelets: 331 10*3/uL (ref 150–450)
RBC: 4.58 x10E6/uL (ref 3.77–5.28)
RDW: 11.6 % — ABNORMAL LOW (ref 11.7–15.4)
WBC: 8.1 10*3/uL (ref 3.4–10.8)

## 2021-06-02 LAB — LIPID PANEL
Chol/HDL Ratio: 4.4 ratio (ref 0.0–4.4)
Cholesterol, Total: 142 mg/dL (ref 100–199)
HDL: 32 mg/dL — ABNORMAL LOW (ref >39)
LDL Chol Calc (NIH): 89 mg/dL (ref 0–99)
Triglycerides: 112 mg/dL (ref 0–149)
VLDL Cholesterol Cal: 21 mg/dL (ref 5–40)

## 2021-06-30 ENCOUNTER — Other Ambulatory Visit: Payer: Self-pay

## 2021-07-13 ENCOUNTER — Other Ambulatory Visit: Payer: Self-pay

## 2021-08-06 ENCOUNTER — Encounter (HOSPITAL_COMMUNITY): Payer: Self-pay

## 2021-08-06 ENCOUNTER — Emergency Department (HOSPITAL_COMMUNITY)
Admission: EM | Admit: 2021-08-06 | Discharge: 2021-08-06 | Disposition: A | Discharge status: Home / Self Care | Payer: Medicaid Other | Attending: Emergency Medicine | Admitting: Emergency Medicine

## 2021-08-06 ENCOUNTER — Other Ambulatory Visit: Payer: Self-pay

## 2021-08-06 DIAGNOSIS — R1110 Vomiting, unspecified: Secondary | ICD-10-CM | POA: Insufficient documentation

## 2021-08-06 DIAGNOSIS — R1012 Left upper quadrant pain: Secondary | ICD-10-CM | POA: Insufficient documentation

## 2021-08-06 DIAGNOSIS — Z5321 Procedure and treatment not carried out due to patient leaving prior to being seen by health care provider: Secondary | ICD-10-CM | POA: Insufficient documentation

## 2021-08-06 HISTORY — DX: Essential (primary) hypertension: I10

## 2021-08-06 HISTORY — DX: Sciatica, unspecified side: M54.30

## 2021-08-06 HISTORY — DX: Lymphedema, not elsewhere classified: I89.0

## 2021-08-06 HISTORY — DX: Heart failure, unspecified: I50.9

## 2021-08-06 MED ORDER — ONDANSETRON 4 MG PO TBDP
4.0000 mg | ORAL_TABLET | Freq: Once | ORAL | Status: AC
  Administered 2021-08-06: 4 mg via ORAL
  Filled 2021-08-06: qty 1

## 2021-08-06 NOTE — ED Provider Notes (Signed)
Emergency Medicine Provider Triage Evaluation Note  Kaylee [name] , a 50 y.o. female  was evaluated in triage.  Pt complains of left sided abdominal pain since 0700 this AM. Patient reports beginning a colon cleanse called "ZuPoo" on Monday. Patient reports that she has been stooling and voiding normally since she began taking this but woke up this morning with nausea, vomiting and left sided abdominal pain.   Review of Systems  Positive: Nausea, vomiting, abdominal pain Negative: Fevers, headache, lightheaded, dizzy, blood in stool, blood in urine, flank pain  Physical Exam  BP (!) 137/93 (BP Location: Left Arm)   Pulse 97   Temp 98.8 F (37.1 C) (Oral)   Resp 18   Ht 5\' 7"  (1.702 m)   Wt (!) 181.4 kg   LMP 07/06/2021   SpO2 93%   BMI 62.65 kg/m  Gen:   Awake, no distress   Resp:  Normal effort  MSK:   Moves extremities without difficulty  Other:    Medical Decision Making  Medically screening exam initiated at 6:26 PM.  Appropriate orders placed.  Kaylee [name] was informed that the remainder of the evaluation will be completed by another provider, this initial triage assessment does not replace that evaluation, and the importance of remaining in the ED until their evaluation is complete.     Durwin Reges, Al [place] 08/06/21 1828    [place], 14/02/22, MD 08/06/21 [phone]

## 2021-08-06 NOTE — ED Triage Notes (Addendum)
Per EMS- patient c/o LUQ abdominal pain and vomiting since 0700 today. Patient reports that she did a colon cleanse 3 days ago and has not had a BM.   Patient states she took an OTC pill  called [facility] 2 days ago.

## 2021-08-10 ENCOUNTER — Telehealth: Payer: Self-pay | Admitting: Critical Care Medicine

## 2021-08-10 NOTE — Telephone Encounter (Signed)
Per Dr. Delford Field patient need a post ed f/u. Called patient no answer and unable to leave vm.

## 2021-08-11 ENCOUNTER — Telehealth: Payer: Self-pay

## 2021-08-11 NOTE — Telephone Encounter (Signed)
Letter has been sent to patient instructing them to call us if they are still interested in completing their sleep study. If we have not received a response from the patient within 30 days of this notice, the order will be cancelled and they will need to discuss the need for a sleep study at their next office visit.  ° °

## 2021-09-02 ENCOUNTER — Other Ambulatory Visit: Payer: Self-pay

## 2021-09-03 ENCOUNTER — Other Ambulatory Visit: Payer: Self-pay

## 2021-09-06 ENCOUNTER — Other Ambulatory Visit: Payer: Self-pay

## 2021-09-07 ENCOUNTER — Other Ambulatory Visit: Payer: Self-pay

## 2021-09-08 ENCOUNTER — Other Ambulatory Visit: Payer: Self-pay

## 2021-09-22 ENCOUNTER — Other Ambulatory Visit: Payer: Self-pay

## 2021-11-29 ENCOUNTER — Ambulatory Visit: Payer: Medicaid Other | Admitting: Critical Care Medicine

## 2022-01-13 ENCOUNTER — Ambulatory Visit: Payer: Medicaid Other | Admitting: Physician Assistant

## 2022-01-27 ENCOUNTER — Ambulatory Visit (HOSPITAL_COMMUNITY)
Admission: EM | Admit: 2022-01-27 | Discharge: 2022-01-27 | Disposition: A | Discharge status: Home / Self Care | Payer: Medicaid Other | Attending: Family Medicine | Admitting: Family Medicine

## 2022-01-27 DIAGNOSIS — N10 Acute tubulo-interstitial nephritis: Secondary | ICD-10-CM

## 2022-01-27 LAB — POCT URINALYSIS DIPSTICK, ED / UC
Glucose, UA: NEGATIVE mg/dL
Ketones, ur: 40 mg/dL — AB
Nitrite: POSITIVE — AB
Protein, ur: 100 mg/dL — AB
Specific Gravity, Urine: 1.02 (ref 1.005–1.030)
Urobilinogen, UA: 2 mg/dL — ABNORMAL HIGH (ref 0.0–1.0)
pH: 6.5 (ref 5.0–8.0)

## 2022-01-27 LAB — CBG MONITORING, ED: Glucose-Capillary: 188 mg/dL — ABNORMAL HIGH (ref 70–99)

## 2022-01-27 LAB — POC URINE PREG, ED: Preg Test, Ur: NEGATIVE

## 2022-01-27 MED ORDER — CEFTRIAXONE SODIUM 1 G IJ SOLR
INTRAMUSCULAR | Status: AC
  Filled 2022-01-27: qty 10

## 2022-01-27 MED ORDER — CEFDINIR 300 MG PO CAPS: 300.0000 mg | ORAL_CAPSULE | Freq: Two times a day (BID) | ORAL | 0 refills | Status: DC

## 2022-01-27 MED ORDER — CEFTRIAXONE SODIUM 1 G IJ SOLR
1.0000 g | Freq: Once | INTRAMUSCULAR | Status: AC
  Administered 2022-01-27: 1 g via INTRAMUSCULAR

## 2022-01-27 MED ORDER — LIDOCAINE HCL (PF) 1 % IJ SOLN
INTRAMUSCULAR | Status: AC
  Filled 2022-01-27: qty 2

## 2022-01-27 MED ORDER — ONDANSETRON 4 MG PO TBDP: 4.0000 mg | ORAL_TABLET | Freq: Three times a day (TID) | ORAL | 0 refills | Status: DC | PRN

## 2022-01-27 NOTE — Discharge Instructions (Addendum)
You have been seen today for a kidney infection. I have given you the medications listed below.   Meds ordered this encounter  Medications   cefTRIAXone (ROCEPHIN) injection 1 g   cefdinir (OMNICEF) 300 MG capsule    Sig: Take 1 capsule (300 mg total) by mouth 2 (two) times daily.    Dispense:  20 capsule    Refill:  0   ondansetron (ZOFRAN-ODT) 4 MG disintegrating tablet    Sig: Take 1 tablet (4 mg total) by mouth every 8 (eight) hours as needed for nausea or vomiting.    Dispense:  15 tablet    Refill:  0   Begin taking your antibiotic as soon as possible. As kidney infections can put people in the hospital, it is very important for you to pay attention to any new symptoms or worsening of your current condition.  Please do your best to ensure adequate fluid intake in order to avoid dehydration. If you find that you are unable to tolerate drinking fluids regularly or if your symptoms worsen in any way you need to proceed to the Emergency Department for evaluation.

## 2022-01-27 NOTE — ED Triage Notes (Signed)
C/o left side pain and headache x 2 days.

## 2022-01-27 NOTE — ED Triage Notes (Signed)
Pt does not report any injuries or falls.

## 2022-01-27 NOTE — ED Provider Notes (Signed)
[facility]   [id number] 01/27/22 Arrival Time: 1634  ASSESSMENT & PLAN:  1. Acute pyelonephritis    With tachycardia in lower 130s upon recheck. She tells me her baseline is in the 120s. Without CP/SOB. Obesity greatly limits abdominal exam but does not display findings of acute abdomen. She is comfortable with outpatient treatment and observation. Tolerating PO fluids with only mild nausea. UPT negative.    Discharge Instructions      You have been seen today for a kidney infection. I have given you the medications listed below.   Meds ordered this encounter  Medications   cefTRIAXone (ROCEPHIN) injection 1 g   cefdinir (OMNICEF) 300 MG capsule    Sig: Take 1 capsule (300 mg total) by mouth 2 (two) times daily.    Dispense:  20 capsule    Refill:  0   ondansetron (ZOFRAN-ODT) 4 MG disintegrating tablet    Sig: Take 1 tablet (4 mg total) by mouth every 8 (eight) hours as needed for nausea or vomiting.    Dispense:  15 tablet    Refill:  0   Begin taking your antibiotic as soon as possible. As kidney infections can put people in the hospital, it is very important for you to pay attention to any new symptoms or worsening of your current condition.  Please do your best to ensure adequate fluid intake in order to avoid dehydration. If you find that you are unable to tolerate drinking fluids regularly or if your symptoms worsen in any way you need to proceed to the Emergency Department for evaluation.      Follow-up Information     [facility] EMERGENCY DEPARTMENT.   Specialty: Emergency Medicine Why: If symptoms worsen in any way. Contact information: [address] [id number] mc [place] [address] [phone]               Investigations: Labs Reviewed  POCT URINALYSIS DIPSTICK, ED / UC - Abnormal; Notable for the following components:      Result Value   Bilirubin Urine SMALL (*)    Ketones, ur 40 (*)     Hgb urine dipstick TRACE (*)    Protein, ur 100 (*)    Urobilinogen, UA 2.0 (*)    Nitrite POSITIVE (*)    Leukocytes,Ua TRACE (*)    All other components within normal limits  CBG MONITORING, ED - Abnormal; Notable for the following components:   Glucose-Capillary 188 (*)    All other components within normal limits  POC URINE PREG, ED   Reviewed expectations re: course of current medical issues. Questions answered. Outlined signs and symptoms indicating need for more acute intervention. Patient verbalized understanding. After Visit Summary given.  SUBJECTIVE: History from: patient. Kaylee [name] is a 51 y.o. female who presents with complaint of fatigue and lower abdominal and flank discomfort; LEFT. Gradual onset approx 2 days ago. Mild nausea without fatigue. Mild HA today. Ambulatory. No treatment PTA. No specific urinary symptoms or hematuria.   No LMP recorded. (Menstrual status: Oral contraceptives).  Past Surgical History:  Procedure Laterality Date   s/p of leep cervix     TUBAL LIGATION       OBJECTIVE:  Vitals:   01/27/22 1722  BP: 126/85  Pulse: (!) 141  Resp: 20  Temp: 100.2 F (37.9 C)  TempSrc: Oral  SpO2: 100%    Tachycardia noted.  General appearance:  fatigued-appearing HEENT: [place]; AT; oropharynx moist Lungs: unlabored respirations Abdomen: morbidly  obese (significantly limits exam); soft; no frank guarding or rebound TTP Back: with reported LEFT CVA discomfort; FROM at waist Extremities: without LE edema; symmetrical; without gross deformities Skin: warm and dry Neurologic: normal gait Psychological: alert and cooperative; normal mood and affect  Labs: Results for orders placed or performed during the hospital encounter of 01/27/22  POC Urinalysis dipstick  Result Value Ref Range   Glucose, UA NEGATIVE NEGATIVE mg/dL   Bilirubin Urine SMALL (A) NEGATIVE   Ketones, ur 40 (A) NEGATIVE mg/dL   Specific Gravity, Urine 1.020 1.005 - 1.030    Hgb urine dipstick TRACE (A) NEGATIVE   pH 6.5 5.0 - 8.0   Protein, ur 100 (A) NEGATIVE mg/dL   Urobilinogen, UA 2.0 (H) 0.0 - 1.0 mg/dL   Nitrite POSITIVE (A) NEGATIVE   Leukocytes,Ua TRACE (A) NEGATIVE  POC urine pregnancy  Result Value Ref Range   Preg Test, Ur NEGATIVE NEGATIVE  POC CBG monitoring  Result Value Ref Range   Glucose-Capillary 188 (H) 70 - 99 mg/dL   Labs Reviewed  POCT URINALYSIS DIPSTICK, ED / UC - Abnormal; Notable for the following components:      Result Value   Bilirubin Urine SMALL (*)    Ketones, ur 40 (*)    Hgb urine dipstick TRACE (*)    Protein, ur 100 (*)    Urobilinogen, UA 2.0 (*)    Nitrite POSITIVE (*)    Leukocytes,Ua TRACE (*)    All other components within normal limits  CBG MONITORING, ED - Abnormal; Notable for the following components:   Glucose-Capillary 188 (*)    All other components within normal limits  POC URINE PREG, ED    Imaging: No results found.   No Known Allergies                                             Past Medical History:  Diagnosis Date   CHF (congestive heart failure) (HCC)    Diabetes mellitus without complication (HCC)    Heavy menstrual period    Hypertension    Lymphedema    Morbid obesity (HCC)    Sciatica     Social History   Socioeconomic History   Marital status: Divorced    Spouse name: Not on file   Number of children: 4   Years of education: Not on file   Highest education level: 12th grade  Occupational History   Not on file  Tobacco Use   Smoking status: Never   Smokeless tobacco: Never  Vaping Use   Vaping Use: Never used  Substance and Sexual Activity   Alcohol use: Not Currently   Drug use: Not Currently   Sexual activity: Not Currently  Other Topics Concern   Not on file  Social History Narrative   Not on file   Social Determinants of Health   Financial Resource Strain: Not on file  Food Insecurity: Not on file  Transportation Needs: Not on file  Physical  Activity: Not on file  Stress: Not on file  Social Connections: Not on file  Intimate Partner Violence: Not on file    Family History  Problem Relation Age of Onset   Leukemia Mother    Breast cancer Maternal Grandmother 30     Mardella Layman, MD 01/27/22 1840

## 2022-02-02 NOTE — Progress Notes (Deleted)
Patient ID: Kaylee [name], female   DOB: 03-13-1971, 51 y.o.   MRN: [id number]   After ED visit 01/27/2022 for pyelonephritis: 1. Acute pyelonephritis     With tachycardia in lower 130s upon recheck. She tells me her baseline is in the 120s. Without CP/SOB. Obesity greatly limits abdominal exam but does not display findings of acute abdomen. She is comfortable with outpatient treatment and observation. Tolerating PO fluids with only mild nausea. UPT negative.  SUBJECTIVE: History from: patient. Kaylee [name] is a 51 y.o. female who presents with complaint of fatigue and lower abdominal and flank discomfort; LEFT. Gradual onset approx 2 days ago. Mild nausea without fatigue. Mild HA today. Ambulatory. No treatment PTA. No specific urinary symptoms or hematuria.

## 2022-02-03 ENCOUNTER — Ambulatory Visit: Payer: Medicaid Other | Admitting: Physician Assistant

## 2022-02-03 DIAGNOSIS — E1165 Type 2 diabetes mellitus with hyperglycemia: Secondary | ICD-10-CM

## 2022-02-06 ENCOUNTER — Inpatient Hospital Stay (HOSPITAL_COMMUNITY)
Admission: EM | Admit: 2022-02-06 | Discharge: 2022-02-11 | DRG: 872 | Disposition: A | Discharge status: Home / Self Care | Payer: 59 | Attending: Family Medicine | Admitting: Family Medicine

## 2022-02-06 ENCOUNTER — Other Ambulatory Visit: Payer: Self-pay

## 2022-02-06 ENCOUNTER — Inpatient Hospital Stay (HOSPITAL_COMMUNITY): Payer: 59

## 2022-02-06 ENCOUNTER — Encounter (HOSPITAL_COMMUNITY): Payer: Self-pay | Admitting: Emergency Medicine

## 2022-02-06 ENCOUNTER — Emergency Department (HOSPITAL_COMMUNITY): Payer: 59

## 2022-02-06 DIAGNOSIS — Z803 Family history of malignant neoplasm of breast: Secondary | ICD-10-CM | POA: Diagnosis not present

## 2022-02-06 DIAGNOSIS — E871 Hypo-osmolality and hyponatremia: Secondary | ICD-10-CM | POA: Diagnosis present

## 2022-02-06 DIAGNOSIS — N390 Urinary tract infection, site not specified: Secondary | ICD-10-CM | POA: Diagnosis present

## 2022-02-06 DIAGNOSIS — E785 Hyperlipidemia, unspecified: Secondary | ICD-10-CM

## 2022-02-06 DIAGNOSIS — K5732 Diverticulitis of large intestine without perforation or abscess without bleeding: Secondary | ICD-10-CM | POA: Diagnosis present

## 2022-02-06 DIAGNOSIS — E1169 Type 2 diabetes mellitus with other specified complication: Secondary | ICD-10-CM | POA: Diagnosis present

## 2022-02-06 DIAGNOSIS — I5032 Chronic diastolic (congestive) heart failure: Secondary | ICD-10-CM

## 2022-02-06 DIAGNOSIS — Z6841 Body Mass Index (BMI) 40.0 and over, adult: Secondary | ICD-10-CM | POA: Diagnosis not present

## 2022-02-06 DIAGNOSIS — E869 Volume depletion, unspecified: Secondary | ICD-10-CM | POA: Diagnosis present

## 2022-02-06 DIAGNOSIS — I10 Essential (primary) hypertension: Secondary | ICD-10-CM | POA: Diagnosis not present

## 2022-02-06 DIAGNOSIS — Z7984 Long term (current) use of oral hypoglycemic drugs: Secondary | ICD-10-CM | POA: Diagnosis not present

## 2022-02-06 DIAGNOSIS — A419 Sepsis, unspecified organism: Secondary | ICD-10-CM | POA: Diagnosis present

## 2022-02-06 DIAGNOSIS — E782 Mixed hyperlipidemia: Secondary | ICD-10-CM | POA: Diagnosis present

## 2022-02-06 DIAGNOSIS — Z1152 Encounter for screening for COVID-19: Secondary | ICD-10-CM | POA: Diagnosis not present

## 2022-02-06 DIAGNOSIS — E559 Vitamin D deficiency, unspecified: Secondary | ICD-10-CM | POA: Diagnosis present

## 2022-02-06 DIAGNOSIS — A414 Sepsis due to anaerobes: Principal | ICD-10-CM | POA: Diagnosis present

## 2022-02-06 DIAGNOSIS — Z87440 Personal history of urinary (tract) infections: Secondary | ICD-10-CM | POA: Diagnosis not present

## 2022-02-06 DIAGNOSIS — I890 Lymphedema, not elsewhere classified: Secondary | ICD-10-CM | POA: Diagnosis present

## 2022-02-06 DIAGNOSIS — I5022 Chronic systolic (congestive) heart failure: Secondary | ICD-10-CM | POA: Diagnosis present

## 2022-02-06 DIAGNOSIS — E1165 Type 2 diabetes mellitus with hyperglycemia: Secondary | ICD-10-CM | POA: Diagnosis present

## 2022-02-06 DIAGNOSIS — I110 Hypertensive heart disease with heart failure: Secondary | ICD-10-CM | POA: Diagnosis present

## 2022-02-06 DIAGNOSIS — Z806 Family history of leukemia: Secondary | ICD-10-CM | POA: Diagnosis not present

## 2022-02-06 DIAGNOSIS — E8721 Acute metabolic acidosis: Secondary | ICD-10-CM | POA: Diagnosis present

## 2022-02-06 DIAGNOSIS — Z79899 Other long term (current) drug therapy: Secondary | ICD-10-CM | POA: Diagnosis not present

## 2022-02-06 DIAGNOSIS — D509 Iron deficiency anemia, unspecified: Secondary | ICD-10-CM | POA: Diagnosis present

## 2022-02-06 DIAGNOSIS — E872 Acidosis, unspecified: Secondary | ICD-10-CM | POA: Diagnosis not present

## 2022-02-06 HISTORY — DX: Type 2 diabetes mellitus with other specified complication: E11.69

## 2022-02-06 HISTORY — DX: Hypercalcemia: E83.52

## 2022-02-06 HISTORY — DX: Hyperlipidemia, unspecified: E78.5

## 2022-02-06 HISTORY — DX: Chronic diastolic (congestive) heart failure: I50.32

## 2022-02-06 HISTORY — DX: Chronic systolic (congestive) heart failure: I50.22

## 2022-02-06 LAB — PROTIME-INR
INR: 1.1 (ref 0.8–1.2)
Prothrombin Time: 13.6 seconds (ref 11.4–15.2)

## 2022-02-06 LAB — CBG MONITORING, ED
Glucose-Capillary: 142 mg/dL — ABNORMAL HIGH (ref 70–99)
Glucose-Capillary: 131 mg/dL — ABNORMAL HIGH (ref 70–99)
Glucose-Capillary: 127 mg/dL — ABNORMAL HIGH (ref 70–99)

## 2022-02-06 LAB — URINALYSIS, ROUTINE W REFLEX MICROSCOPIC
Bilirubin Urine: NEGATIVE
Glucose, UA: NEGATIVE mg/dL
Ketones, ur: 5 mg/dL — AB
Nitrite: NEGATIVE
Protein, ur: 100 mg/dL — AB
Specific Gravity, Urine: 1.018 (ref 1.005–1.030)
Squamous Epithelial / HPF: >50 — ABNORMAL HIGH (ref 0–5)
WBC, UA: >50 WBC/hpf — ABNORMAL HIGH (ref 0–5)
pH: 5 (ref 5.0–8.0)

## 2022-02-06 LAB — LACTIC ACID, PLASMA
Lactic Acid, Venous: 3.2 mmol/L (ref 0.5–1.9)
Lactic Acid, Venous: 1.8 mmol/L (ref 0.5–1.9)

## 2022-02-06 LAB — COMPREHENSIVE METABOLIC PANEL
ALT: 13 U/L (ref 0–44)
AST: 16 U/L (ref 15–41)
Albumin: 3 g/dL — ABNORMAL LOW (ref 3.5–5.0)
Alkaline Phosphatase: 102 U/L (ref 38–126)
Anion gap: 11 (ref 5–15)
BUN: <5 mg/dL — ABNORMAL LOW (ref 6–20)
CO2: 22 mmol/L (ref 22–32)
Calcium: 10.4 mg/dL — ABNORMAL HIGH (ref 8.9–10.3)
Chloride: 100 mmol/L (ref 98–111)
Creatinine, Ser: 0.74 mg/dL (ref 0.44–1.00)
GFR, Estimated: >60 mL/min (ref >60)
Glucose, Bld: 153 mg/dL — ABNORMAL HIGH (ref 70–99)
Potassium: 4 mmol/L (ref 3.5–5.1)
Sodium: 133 mmol/L — ABNORMAL LOW (ref 135–145)
Total Bilirubin: 0.6 mg/dL (ref 0.3–1.2)
Total Protein: 9.5 g/dL — ABNORMAL HIGH (ref 6.5–8.1)

## 2022-02-06 LAB — APTT: aPTT: 29 seconds (ref 24–36)

## 2022-02-06 LAB — CBC WITH DIFFERENTIAL/PLATELET
Abs Immature Granulocytes: 0.09 10*3/uL — ABNORMAL HIGH (ref 0.00–0.07)
Basophils Absolute: 0.1 10*3/uL (ref 0.0–0.1)
Basophils Relative: 0 %
Eosinophils Absolute: 0.1 10*3/uL (ref 0.0–0.5)
Eosinophils Relative: 0 %
HCT: 43 % (ref 36.0–46.0)
Hemoglobin: 13.2 g/dL (ref 12.0–15.0)
Immature Granulocytes: 1 %
Lymphocytes Relative: 33 %
Lymphs Abs: 6.3 10*3/uL — ABNORMAL HIGH (ref 0.7–4.0)
MCH: 27 pg (ref 26.0–34.0)
MCHC: 30.7 g/dL (ref 30.0–36.0)
MCV: 87.9 fL (ref 80.0–100.0)
Monocytes Absolute: 1.4 10*3/uL — ABNORMAL HIGH (ref 0.1–1.0)
Monocytes Relative: 7 %
Neutro Abs: 11.2 10*3/uL — ABNORMAL HIGH (ref 1.7–7.7)
Neutrophils Relative %: 59 %
Platelets: 443 10*3/uL — ABNORMAL HIGH (ref 150–400)
RBC: 4.89 MIL/uL (ref 3.87–5.11)
RDW: 14.6 % (ref 11.5–15.5)
WBC: 19.1 10*3/uL — ABNORMAL HIGH (ref 4.0–10.5)
nRBC: 0 % (ref 0.0–0.2)

## 2022-02-06 LAB — HEMOGLOBIN A1C
Hgb A1c MFr Bld: 7.1 % — ABNORMAL HIGH (ref 4.8–5.6)
Mean Plasma Glucose: 157.07 mg/dL

## 2022-02-06 LAB — RESP PANEL BY RT-PCR (FLU A&B, COVID) ARPGX2
Influenza A by PCR: NEGATIVE
Influenza B by PCR: NEGATIVE
SARS Coronavirus 2 by RT PCR: NEGATIVE

## 2022-02-06 LAB — GLUCOSE, CAPILLARY: Glucose-Capillary: 129 mg/dL — ABNORMAL HIGH (ref 70–99)

## 2022-02-06 LAB — I-STAT BETA HCG BLOOD, ED (MC, WL, AP ONLY): I-stat hCG, quantitative: <5 m[IU]/mL (ref <5)

## 2022-02-06 LAB — HIV ANTIBODY (ROUTINE TESTING W REFLEX): HIV Screen 4th Generation wRfx: NONREACTIVE

## 2022-02-06 LAB — VITAMIN D 25 HYDROXY (VIT D DEFICIENCY, FRACTURES): Vit D, 25-Hydroxy: 13.55 ng/mL — ABNORMAL LOW (ref 30–100)

## 2022-02-06 MED ORDER — METOPROLOL TARTRATE 25 MG PO TABS: 75.0000 mg | ORAL_TABLET | Freq: Two times a day (BID) | ORAL | Status: DC

## 2022-02-06 MED ORDER — ALBUTEROL SULFATE (2.5 MG/3ML) 0.083% IN NEBU: 2.5000 mg | INHALATION_SOLUTION | RESPIRATORY_TRACT | Status: DC | PRN

## 2022-02-06 MED ORDER — SODIUM CHLORIDE 0.9 % IV SOLN: 2.0000 g | INTRAVENOUS | Status: DC

## 2022-02-06 MED ORDER — SODIUM CHLORIDE 0.9% FLUSH
3.0000 mL | Freq: Two times a day (BID) | INTRAVENOUS | Status: DC
  Administered 2022-02-06 – 2022-02-11 (×9): 3 mL via INTRAVENOUS

## 2022-02-06 MED ORDER — LACTATED RINGERS IV SOLN: INTRAVENOUS | Status: AC

## 2022-02-06 MED ORDER — ONDANSETRON HCL 4 MG PO TABS
4.0000 mg | ORAL_TABLET | Freq: Four times a day (QID) | ORAL | Status: DC | PRN
  Filled 2022-02-06: qty 1

## 2022-02-06 MED ORDER — ACETAMINOPHEN 325 MG PO TABS
650.0000 mg | ORAL_TABLET | Freq: Four times a day (QID) | ORAL | Status: DC | PRN
  Administered 2022-02-08: 650 mg via ORAL

## 2022-02-06 MED ORDER — SODIUM CHLORIDE 0.9 % IV SOLN
2.0000 g | Freq: Once | INTRAVENOUS | Status: AC
  Administered 2022-02-06: 2 g via INTRAVENOUS
  Filled 2022-02-06: qty 12.5

## 2022-02-06 MED ORDER — ATORVASTATIN CALCIUM 40 MG PO TABS
40.0000 mg | ORAL_TABLET | Freq: Every day | ORAL | Status: DC
  Administered 2022-02-06 – 2022-02-11 (×6): 40 mg via ORAL
  Filled 2022-02-06 (×6): qty 1

## 2022-02-06 MED ORDER — LACTATED RINGERS IV SOLN: INTRAVENOUS | Status: DC

## 2022-02-06 MED ORDER — ONDANSETRON HCL 4 MG/2ML IJ SOLN: 4.0000 mg | Freq: Four times a day (QID) | INTRAMUSCULAR | Status: DC | PRN

## 2022-02-06 MED ORDER — OXYCODONE-ACETAMINOPHEN 5-325 MG PO TABS
1.0000 | ORAL_TABLET | ORAL | Status: DC | PRN
  Administered 2022-02-06 – 2022-02-11 (×11): 1 via ORAL
  Filled 2022-02-06 (×11): qty 1

## 2022-02-06 MED ORDER — LACTATED RINGERS IV BOLUS (SEPSIS)
3000.0000 mL | Freq: Once | INTRAVENOUS | Status: AC
  Administered 2022-02-06: 1000 mL via INTRAVENOUS

## 2022-02-06 MED ORDER — ACETAMINOPHEN 650 MG RE SUPP: 650.0000 mg | Freq: Four times a day (QID) | RECTAL | Status: DC | PRN

## 2022-02-06 MED ORDER — ACETAMINOPHEN 325 MG PO TABS
650.0000 mg | ORAL_TABLET | Freq: Once | ORAL | Status: DC | PRN
  Filled 2022-02-06: qty 2

## 2022-02-06 MED ORDER — FLUTICASONE FUROATE-VILANTEROL 200-25 MCG/ACT IN AEPB
1.0000 | INHALATION_SPRAY | Freq: Every day | RESPIRATORY_TRACT | Status: DC
  Administered 2022-02-07 – 2022-02-11 (×5): 1 via RESPIRATORY_TRACT
  Filled 2022-02-06: qty 28

## 2022-02-06 MED ORDER — PIPERACILLIN-TAZOBACTAM 3.375 G IVPB
3.3750 g | Freq: Three times a day (TID) | INTRAVENOUS | Status: DC
  Administered 2022-02-06 – 2022-02-09 (×9): 3.375 g via INTRAVENOUS
  Filled 2022-02-06 (×11): qty 50

## 2022-02-06 MED ORDER — MONTELUKAST SODIUM 10 MG PO TABS
10.0000 mg | ORAL_TABLET | Freq: Every day | ORAL | Status: DC
  Administered 2022-02-06 – 2022-02-10 (×5): 10 mg via ORAL
  Filled 2022-02-06 (×5): qty 1

## 2022-02-06 MED ORDER — ENOXAPARIN SODIUM 100 MG/ML IJ SOSY
100.0000 mg | PREFILLED_SYRINGE | Freq: Every day | INTRAMUSCULAR | Status: DC
  Administered 2022-02-06 – 2022-02-11 (×6): 100 mg via SUBCUTANEOUS
  Filled 2022-02-06 (×6): qty 1

## 2022-02-06 MED ORDER — LOSARTAN POTASSIUM 50 MG PO TABS
50.0000 mg | ORAL_TABLET | Freq: Every day | ORAL | Status: DC
  Administered 2022-02-06 – 2022-02-11 (×6): 50 mg via ORAL
  Filled 2022-02-06 (×6): qty 1

## 2022-02-06 MED ORDER — IOHEXOL 300 MG/ML  SOLN
100.0000 mL | Freq: Once | INTRAMUSCULAR | Status: AC | PRN
  Administered 2022-02-06: 100 mL via INTRAVENOUS

## 2022-02-06 MED ORDER — ALBUTEROL SULFATE HFA 108 (90 BASE) MCG/ACT IN AERS
2.0000 | INHALATION_SPRAY | Freq: Four times a day (QID) | RESPIRATORY_TRACT | Status: DC | PRN
Start: 2022-02-06 — End: 2022-02-06

## 2022-02-06 MED ORDER — METOPROLOL TARTRATE 25 MG PO TABS
25.0000 mg | ORAL_TABLET | Freq: Two times a day (BID) | ORAL | Status: DC
  Administered 2022-02-06 – 2022-02-11 (×9): 25 mg via ORAL
  Filled 2022-02-06 (×11): qty 1

## 2022-02-06 MED ORDER — INSULIN ASPART 100 UNIT/ML IJ SOLN
0.0000 [IU] | Freq: Three times a day (TID) | INTRAMUSCULAR | Status: DC
  Administered 2022-02-06 (×4): 2 [IU] via SUBCUTANEOUS
  Administered 2022-02-07: 3 [IU] via SUBCUTANEOUS
  Administered 2022-02-07 – 2022-02-08 (×3): 2 [IU] via SUBCUTANEOUS
  Administered 2022-02-08: 3 [IU] via SUBCUTANEOUS
  Administered 2022-02-08 – 2022-02-09 (×2): 2 [IU] via SUBCUTANEOUS
  Administered 2022-02-09: 3 [IU] via SUBCUTANEOUS
  Administered 2022-02-10 – 2022-02-11 (×4): 2 [IU] via SUBCUTANEOUS

## 2022-02-06 MED ORDER — POLYETHYLENE GLYCOL 3350 17 G PO PACK: 17.0000 g | PACK | Freq: Every day | ORAL | Status: DC | PRN

## 2022-02-06 NOTE — ED Provider Notes (Signed)
[facility] EMERGENCY DEPARTMENT Provider Note   CSN: [id number] Arrival date & time: 02/06/22  0122     History  Chief Complaint  Patient presents with   Recurrent UTI    Kaylee [name] is a 51 y.o. female with a past medical history of elevated BMI, cardiomegaly, hyperlipidemia, type 2 diabetes, who presents emergency department with chief complaint of abdominal pain and back pain.  Patient was seen by family medicine provider Dr. Marvis Repress about 2 weeks ago, diagnosed with pyelonephritis, given a shot of Rocephin and started on Omnicef.  Patient states that she completed her antibiotics and was doing better and about 2 days later began having suprapubic pain and noticed there was blood in her urine.  Upon arrival patient was noted to be febrile and tachycardic.  While waiting in the waiting room her lactic acid came back and was elevated along with a white count of 19,000.  Patient appears to meet sepsis criteria.  Because she has just used Rocephin and Omnicef I opted to cover with cefepime.  She denies nausea or vomiting.  HPI     Home Medications Prior to Admission medications   Medication Sig Start Date End Date Taking? Authorizing Provider  albuterol (VENTOLIN HFA) 108 (90 Base) MCG/ACT inhaler Inhale 2 puffs into the lungs every 6 (six) hours as needed for wheezing or shortness of breath. 05/26/21   Elsie Stain, MD  atorvastatin (LIPITOR) 40 MG tablet TAKE 1 TABLET (40 MG TOTAL) BY MOUTH DAILY. 11/30/20 11/30/21  Elsie Stain, MD  Blood Glucose Monitoring Suppl (TRUE METRIX METER) w/Device KIT Use to check blood sugar TID. 02/14/20   Fulp, Cammie, MD  cefdinir (OMNICEF) 300 MG capsule Take 1 capsule (300 mg total) by mouth 2 (two) times daily. 01/27/22   Vanessa Kick, MD  dapagliflozin propanediol (FARXIGA) 10 MG TABS tablet Take 1 tablet (10 mg total) by mouth daily before breakfast. 06/01/21   Elsie Stain, MD  ferrous sulfate 325 (65 FE) MG tablet  TAKE 1 TABLET (325 MG TOTAL) BY MOUTH 2 (TWO) TIMES DAILY WITH A MEAL. MUST HAVE OFFICE VISIT FOR REFILLS 06/01/21 06/01/22  Elsie Stain, MD  fluticasone-salmeterol (ADVAIR) 250-50 MCG/ACT AEPB Inhale 1 puff into the lungs 2 (two) times daily. 06/01/21   Elsie Stain, MD  furosemide (LASIX) 20 MG tablet TAKE 1 TABLET (20 MG TOTAL) BY MOUTH DAILY. 06/01/21 06/01/22  Elsie Stain, MD  glipiZIDE (GLUCOTROL) 5 MG tablet TAKE 1 TABLET (5 MG TOTAL) BY MOUTH 2 (TWO) TIMES DAILY BEFORE A MEAL. 06/01/21 06/01/22  Elsie Stain, MD  glucose blood (TRUE METRIX BLOOD GLUCOSE TEST) test strip Use to check blood sugar 3 times daily. 06/01/21   Elsie Stain, MD  glucose blood test strip USE TO CHECK BLOOD SUGAR 3 TIMES DAILY 06/01/21 06/01/22  Elsie Stain, MD  losartan (COZAAR) 50 MG tablet TAKE 1 TABLET (50 MG TOTAL) BY MOUTH DAILY. 06/01/21 06/01/22  Elsie Stain, MD  medroxyPROGESTERone (PROVERA) 10 MG tablet TAKE 2 TABLETS (20 MG TOTAL) BY MOUTH DAILY. 06/01/21 06/01/22  Elsie Stain, MD  metoprolol tartrate (LOPRESSOR) 25 MG tablet Take 3 tablets (75 mg total) by mouth 2 (two) times daily. 06/01/21 10/22/21  Elsie Stain, MD  montelukast (SINGULAIR) 10 MG tablet TAKE 1 TABLET (10 MG TOTAL) BY MOUTH AT BEDTIME. 06/01/21 06/01/22  Elsie Stain, MD  ondansetron (ZOFRAN-ODT) 4 MG disintegrating tablet Take 1 tablet (4 mg total) by  mouth every 8 (eight) hours as needed for nausea or vomiting. 01/27/22   Vanessa Kick, MD  potassium chloride (KLOR-CON) 10 MEQ tablet TAKE 1 TABLET (10 MEQ TOTAL) BY MOUTH DAILY. 06/01/21 06/01/22  Elsie Stain, MD  TRUEplus Lancets 28G MISC Use to check blood sugar 3 times daily. 06/01/21   Elsie Stain, MD      Allergies    Patient has no known allergies.    Review of Systems   Review of Systems  Physical Exam Updated Vital Signs BP (!) 147/96 (BP Location: Right Wrist)   Pulse (!) 108   Temp 99.5 F (37.5 C) (Oral)   Resp 17   SpO2  95%  Physical Exam Vitals and nursing note reviewed.  Constitutional:      General: She is not in acute distress.    Appearance: She is well-developed. She is not diaphoretic.  HENT:     Head: Normocephalic and atraumatic.     Right Ear: External ear normal.     Left Ear: External ear normal.     Nose: Nose normal.     Mouth/Throat:     Mouth: Mucous membranes are moist.  Eyes:     General: No scleral icterus.    Conjunctiva/sclera: Conjunctivae normal.  Cardiovascular:     Rate and Rhythm: Regular rhythm. Tachycardia present.     Heart sounds: Normal heart sounds. No murmur heard.   No friction rub. No gallop.  Pulmonary:     Effort: Pulmonary effort is normal. No respiratory distress.     Breath sounds: Normal breath sounds.  Abdominal:     General: Bowel sounds are normal. There is no distension.     Palpations: Abdomen is soft. There is no mass.     Tenderness: There is abdominal tenderness in the suprapubic area. There is right CVA tenderness and left CVA tenderness. There is no guarding.     Comments: Exam limited due to body habitus  Musculoskeletal:     Cervical back: Normal range of motion.  Skin:    General: Skin is warm and dry.  Neurological:     Mental Status: She is alert and oriented to person, place, and time.  Psychiatric:        Behavior: Behavior normal.    ED Results / Procedures / Treatments   Labs (all labs ordered are listed, but only abnormal results are displayed) Labs Reviewed  URINALYSIS, ROUTINE W REFLEX MICROSCOPIC - Abnormal; Notable for the following components:      Result Value   Color, Urine AMBER (*)    APPearance CLOUDY (*)    Hgb urine dipstick SMALL (*)    Ketones, ur 5 (*)    Protein, ur 100 (*)    Leukocytes,Ua MODERATE (*)    WBC, UA >50 (*)    Bacteria, UA MANY (*)    Squamous Epithelial / LPF >50 (*)    All other components within normal limits  LACTIC ACID, PLASMA - Abnormal; Notable for the following components:    Lactic Acid, Venous 3.2 (*)    All other components within normal limits  COMPREHENSIVE METABOLIC PANEL - Abnormal; Notable for the following components:   Sodium 133 (*)    Glucose, Bld 153 (*)    BUN <5 (*)    Calcium 10.4 (*)    Total Protein 9.5 (*)    Albumin 3.0 (*)    All other components within normal limits  CBC WITH DIFFERENTIAL/PLATELET - Abnormal; Notable for  the following components:   WBC 19.1 (*)    Platelets 443 (*)    Neutro Abs 11.2 (*)    Lymphs Abs 6.3 (*)    Monocytes Absolute 1.4 (*)    Abs Immature Granulocytes 0.09 (*)    All other components within normal limits  CULTURE, BLOOD (ROUTINE X 2)  CULTURE, BLOOD (ROUTINE X 2)  RESP PANEL BY RT-PCR (FLU A&B, COVID) ARPGX2  URINE CULTURE  LACTIC ACID, PLASMA  PROTIME-INR  APTT  I-STAT BETA HCG BLOOD, ED ([facility], [facility], [facility] ONLY)    EKG None  Radiology No results found.  Procedures Procedures    Medications Ordered in ED Medications  acetaminophen (TYLENOL) tablet 650 mg (has no administration in time range)  lactated ringers infusion (has no administration in time range)  lactated ringers bolus 3,000 mL (has no administration in time range)  ceFEPIme (MAXIPIME) 2 g in sodium chloride 0.9 % 100 mL IVPB (has no administration in time range)    ED Course/ Medical Decision Making/ A&P Clinical Course as of 02/06/22 0346  Sun Feb 06, 2022  0333 CBC with Differential(!) [AH]  0345 WBC(!): 19.1 [AH]  0345 Lactic Acid, Venous(!!): 3.2 [AH]  0345 Glucose(!): 153 [AH]  0345 Urinalysis, Routine w reflex microscopic Urine, Clean Catch(!) [AH]  0345 Bacteria, UA(!): MANY [AH]  0346 Urinalysis, Routine w reflex microscopic Urine, Clean Catch(!) [AH]    Clinical Course User Index [AH] Margarita Mail, PA-C                           Medical Decision Making Patient arrives with a recent diagnosis of pyelonephritis, completion of antibiotics, returns to the emergency today with suprapubic pain, hematuria and  flank pain.  Exam findings are consistent with urosepsis.  Patient's vital signs have improved with defervesced since after taking Tylenol earlier today.  Her lactic acid is elevated, white blood cell count is high.  Patient is receiving fluids and cefepime.  She will need admission for urosepsis.  Problems Addressed: Sepsis, due to unspecified organism, unspecified whether acute organ dysfunction present [facility]): acute illness or injury  Amount and/or Complexity of Data Reviewed Labs: ordered. Decision-making details documented in ED Course. Radiology: ordered. ECG/medicine tests: ordered.  Risk OTC drugs. Prescription drug management. Decision regarding hospitalization.           Final Clinical Impression(s) / ED Diagnoses Final diagnoses:  Sepsis, due to unspecified organism, unspecified whether acute organ dysfunction present [facility])    Rx / DC Orders ED Discharge Orders     None         Margarita Mail, PA-C 02/06/22 0430    Ripley Fraise, MD 02/06/22 0501

## 2022-02-06 NOTE — H&P (Signed)
History and Physical    Patient: Kaylee [name] MRN: [id number] DOA: 02/06/2022  Date of Service: the patient was seen and examined on 02/06/2022  Patient coming from: Home  Chief Complaint:  Chief Complaint  Patient presents with   Recurrent UTI    HPI:   51 year old female with past medical history of hyperlipidemia, diabetes mellitus type 2, hypertension, diastolic congestive heart failure (Echo 01/2021 EF 70-75%), morbid obesity, iron deficiency anemia presenting to [facility] hospital emergency department complaints of lower abdominal pain.  Patient explains that approximately week and a half ago she began to experience lower abdominal pain.  This is associated with some low back pain.  Pain was mild to moderate intensity but rapidly worsened in the days that followed associated with generalized weakness, fatigue and intermittent headaches with poor oral intake.  Patient denies associated fevers.  As patient's symptoms worsened she presented to a local urgent care clinic on 5/25 and at that time was diagnosed with a urinary tract infection.  Patient given a dose of intramuscular ceftriaxone in clinic and was sent home with a course of cefdinir over the next 10 days.  Patient took antibiotics as instructed and reports transient improvement in symptoms.  Unfortunately, patient's symptoms have recurred and are worsening.  Patient reports severe lower abdominal pain and additionally reports occasional blood in her urine.  Patient complains of severe generalized weakness and intermittent fevers at this point.  Patient has presented once again to [facility] emergency department for evaluation.  Upon evaluation in the emergency department patient was found to exhibit multiple SIRS criteria including fever of 101.3 F, tachycardia with heart rates in the 120s and leukocytosis of 19.1 all concerning for developing sepsis.  Patient was given a dose of cefepime in the hospital scrip  was then called to assess the patient for admission to the hospital.    Review of Systems: Review of Systems  Gastrointestinal:  Positive for abdominal pain and nausea.  Neurological:  Positive for weakness.  All other systems reviewed and are negative.   Past Medical History:  Diagnosis Date   CHF (congestive heart failure) (HCC)    Chronic systolic CHF (congestive heart failure) ([place]) 02/06/2022   Diabetes mellitus without complication ([place])    Essential hypertension 11/30/2020   Heavy menstrual period    Hypercalcemia 02/06/2022   Hypertension    Lymphedema    Mixed diabetic hyperlipidemia associated with type 2 diabetes mellitus ([place]) 02/06/2022   Morbid obesity ([place])    Obesity, morbid, BMI 50 or higher ([place]) 11/30/2020   Sciatica    Type 2 diabetes mellitus with hyperglycemia, without long-term current use of insulin ([place]) 11/30/2020    Past Surgical History:  Procedure Laterality Date   s/p of leep cervix     TUBAL LIGATION      Social History:  reports that she has never smoked. She has never used smokeless tobacco. She reports that she does not currently use alcohol. She reports that she does not currently use drugs.  No Known Allergies  Family History  Problem Relation Age of Onset   Leukemia Mother    Breast cancer Maternal Grandmother 30    Prior to Admission medications   Medication Sig Start Date End Date Taking? Authorizing Provider  albuterol (VENTOLIN HFA) 108 (90 Base) MCG/ACT inhaler Inhale 2 puffs into the lungs every 6 (six) hours as needed for wheezing or shortness of breath. 05/26/21   Elsie Stain, MD  atorvastatin (LIPITOR) 40  MG tablet TAKE 1 TABLET (40 MG TOTAL) BY MOUTH DAILY. 11/30/20 11/30/21  Elsie Stain, MD  Blood Glucose Monitoring Suppl (TRUE METRIX METER) w/Device KIT Use to check blood sugar TID. 02/14/20   Fulp, Cammie, MD  cefdinir (OMNICEF) 300 MG capsule Take 1 capsule (300 mg total) by mouth 2 (two) times daily. 01/27/22   Vanessa Kick, MD  dapagliflozin propanediol (FARXIGA) 10 MG TABS tablet Take 1 tablet (10 mg total) by mouth daily before breakfast. 06/01/21   Elsie Stain, MD  ferrous sulfate 325 (65 FE) MG tablet TAKE 1 TABLET (325 MG TOTAL) BY MOUTH 2 (TWO) TIMES DAILY WITH A MEAL. MUST HAVE OFFICE VISIT FOR REFILLS 06/01/21 06/01/22  Elsie Stain, MD  fluticasone-salmeterol (ADVAIR) 250-50 MCG/ACT AEPB Inhale 1 puff into the lungs 2 (two) times daily. 06/01/21   Elsie Stain, MD  furosemide (LASIX) 20 MG tablet TAKE 1 TABLET (20 MG TOTAL) BY MOUTH DAILY. 06/01/21 06/01/22  Elsie Stain, MD  glipiZIDE (GLUCOTROL) 5 MG tablet TAKE 1 TABLET (5 MG TOTAL) BY MOUTH 2 (TWO) TIMES DAILY BEFORE A MEAL. 06/01/21 06/01/22  Elsie Stain, MD  glucose blood (TRUE METRIX BLOOD GLUCOSE TEST) test strip Use to check blood sugar 3 times daily. 06/01/21   Elsie Stain, MD  glucose blood test strip USE TO CHECK BLOOD SUGAR 3 TIMES DAILY 06/01/21 06/01/22  Elsie Stain, MD  losartan (COZAAR) 50 MG tablet TAKE 1 TABLET (50 MG TOTAL) BY MOUTH DAILY. 06/01/21 06/01/22  Elsie Stain, MD  medroxyPROGESTERone (PROVERA) 10 MG tablet TAKE 2 TABLETS (20 MG TOTAL) BY MOUTH DAILY. 06/01/21 06/01/22  Elsie Stain, MD  metoprolol tartrate (LOPRESSOR) 25 MG tablet Take 3 tablets (75 mg total) by mouth 2 (two) times daily. 06/01/21 10/22/21  Elsie Stain, MD  montelukast (SINGULAIR) 10 MG tablet TAKE 1 TABLET (10 MG TOTAL) BY MOUTH AT BEDTIME. 06/01/21 06/01/22  Elsie Stain, MD  ondansetron (ZOFRAN-ODT) 4 MG disintegrating tablet Take 1 tablet (4 mg total) by mouth every 8 (eight) hours as needed for nausea or vomiting. 01/27/22   Vanessa Kick, MD  potassium chloride (KLOR-CON) 10 MEQ tablet TAKE 1 TABLET (10 MEQ TOTAL) BY MOUTH DAILY. 06/01/21 06/01/22  Elsie Stain, MD  TRUEplus Lancets 28G MISC Use to check blood sugar 3 times daily. 06/01/21   Elsie Stain, MD    Physical Exam:  Vitals:   02/06/22 0600  02/06/22 0615 02/06/22 0630 02/06/22 0645  BP: 120/62 105/69 121/87 107/71  Pulse: (!) 115 (!) 110 (!) 119 (!) 107  Resp: (!) 23 17 (!) 30 (!) 25  Temp:      TempSrc:      SpO2: 98% 99% 96% 94%    Constitutional: Awake alert and oriented x3, no associated distress.  Patient is obese.  Skin: no rashes, no lesions, poor skin turgor noted.  Eyes: Pupils are equally reactive to light.  No evidence of scleral icterus or conjunctival pallor.  ENMT: Dry mucous membranes noted.  Posterior pharynx clear of any exudate or lesions.   Neck: normal, supple, no masses, no thyromegaly.  No evidence of jugular venous distension.   Respiratory: clear to auscultation bilaterally, no wheezing, no crackles. Normal respiratory effort. No accessory muscle use.  Cardiovascular: Regular rate and rhythm, no murmurs / rubs / gallops. No extremity edema. 2+ pedal pulses. No carotid bruits.  Chest:   Nontender without crepitus or deformity.   Back:  Nontender without crepitus or deformity. Abdomen: Abdomen is protuberant but soft. Notable left lower quadrant tenderness.    No evidence of intra-abdominal masses.  Positive bowel sounds noted in all quadrants.   Musculoskeletal: No joint deformity upper and lower extremities. Good ROM, no contractures. Normal muscle tone.  Neurologic: CN 2-12 grossly intact. Sensation intact.  Patient moving all 4 extremities spontaneously.  Patient is following all commands.  Patient is responsive to verbal stimuli.   Psychiatric: Patient exhibits normal mood with appropriate affect.  Patient seems to possess insight as to their current situation.    Data Reviewed:  I have personally reviewed and interpreted labs, imaging.  Significant findings are:  CBC revealing white blood cell count of 19.1, hemoglobin 13.2, hematocrit 43, platelet count of 443. Chemistry revealing sodium 133, potassium 4.0, BUN less than 5 with creatinine 0.74. CT imaging of the abdomen and pelvis revealing a  confluent inflammatory process of the left lower quadrant left pelvic inlet appearing related to complicated sigmoid diverticulitis with evidence of involvement of the left adnexa the left ureter and bladder dome with associated retroperitoneal lymphadenopathy.  No evidence of pneumoperitoneum. Chest x-ray personally reviewed revealing no evidence of cardiopulmonary disease.  Cardiomegaly noted.  EKG: Personally reviewed.  Rhythm is sinus tachycardia with heart rate of 121 bpm.  No dynamic ST segment changes appreciated.   Assessment and Plan: * Diverticulitis large intestine w/o perforation or abscess w/o bleeding Patient presenting with nearly 2-week history of lower abdominal pain after symptoms not improved with course of Keflex for suspected urinary tract infection CT imaging consistent with diverticulitis, complicated by involvement of the left adnexa, course of the left ureter and bladder dome.   Considering this complicated appearance, I discussed the case with surgery who will graciously come evaluate the patient today in consultation.  It is likely that patient will simply require antibiotics and not surgical intervention at this point. Treated with intravenous Zosyn for now Hydrating patient with intravenous isotonic fluids Clear liquid diet As needed opiate-based analgesics for associated substantial pain.  Sepsis due to anaerobic bacteria [facility] Patient presenting with evidence of multiple SIRS criteria in the setting of diverticulitis concerning for developing sepsis possibly due to an anaerobic or gram-negative organism from diverticulitis On broad-spectrum intravenous antibiotics Hydrating with intravenous fluids Blood cultures obtained Remainder of assessment and plan as above  Lactic acidosis Lactic acidosis likely secondary to sepsis with concurrent volume depletion Hydrate patient intravenous isotonic fluids while concurrently treating underlying infection Monitoring  serial lactic acid levels to ensure downtrending and resolution.   Hypercalcemia Notable hypercalcemia on admission chemistry Review of previous labs shows that patient seems to have had a chronic mild hypercalcemia for quite a while Working this up with vitamin D levels, PTH and PTH related peptide Hydrating with intravenous isotonic fluids for now Monitoring calcium levels with serial chemistries  Type 2 diabetes mellitus with hyperglycemia, without long-term current use of insulin ([place]) Patient been placed on Accu-Cheks before every meal and nightly with sliding scale insulin Holding home regimen of hypoglycemics Hemoglobin A1C ordered Diabetic Diet   Chronic systolic CHF (congestive heart failure) (HCC) No clinical evidence of cardiogenic volume overload Strict input and output monitoring Daily weights Low-sodium diet   Essential hypertension Resume patients home regimen of oral antihypertensives Titrate antihypertensive regimen as necessary to achieve adequate BP control PRN intravenous antihypertensives for excessively elevated blood pressure    Mixed diabetic hyperlipidemia associated with type 2 diabetes mellitus ([place]) Continuing home regimen of lipid lowering  therapy.   Obesity, morbid, BMI 50 or higher ([place]) Once clinically improved we will counsel patient on caloric restriction and regular physical activity.       Code Status:  Full code  code status decision has been confirmed with: Patient Family Communication: deferred  Consults: General Surgery  Severity of Illness:  The appropriate patient status for this patient is INPATIENT. Inpatient status is judged to be reasonable and necessary in order to provide the required intensity of service to ensure the patient's safety. The patient's presenting symptoms, physical exam findings, and initial radiographic and laboratory data in the context of their chronic comorbidities is felt to place them at high risk  for further clinical deterioration. Furthermore, it is not anticipated that the patient will be medically stable for discharge from the hospital within 2 midnights of admission.   * I certify that at the point of admission it is my clinical judgment that the patient will require inpatient hospital care spanning beyond 2 midnights from the point of admission due to high intensity of service, high risk for further deterioration and high frequency of surveillance required.*  Author:  Vernelle Emerald MD  02/06/2022 10:40 AM

## 2022-02-06 NOTE — Progress Notes (Signed)
Same day note  Patient seen and examined at bedside.  Patient was admitted to the hospital for abdominal pain.  At the time of my evaluation, patient complains of abdominal pain.  Physical examination reveals morbidly obese female, dry mucous membrane, positive bowel sounds, left lower quadrant tenderness on palpation,  Laboratory data and imaging was reviewed  Assessment and Plan. Principal Problem:   Diverticulitis large intestine w/o perforation or abscess w/o bleeding Active Problems:   Sepsis due to anaerobic bacteria (HCC)   Lactic acidosis   Hypercalcemia   Type 2 diabetes mellitus with hyperglycemia, without long-term current use of insulin (HCC)   Chronic systolic CHF (congestive heart failure) (HCC)   Essential hypertension   Mixed diabetic hyperlipidemia associated with type 2 diabetes mellitus (HCC)   Obesity, morbid, BMI 50 or higher (HCC)   Diverticulitis large intestine w/o perforation or abscess w/o bleeding 3-week history of abdominal pain.  CT scan consistent with diverticulitis complicated by involvement of the left adnexa, course of the left ureter and bladder dome.  General surgery was consulted for this finding and at this time no surgical intervention planned.  Continue antibiotic.  Continue IV fluids, n.p.o. for now as per general surgery  Sepsis secondary to diverticulitis, UTI Patient presented with fever of 101.3 F, tachycardia, elevated lactate, tachypnea on presentation with significant leukocytosis.  Urinalysis positive for many bacteria and more than 50 white cells with negative nitrate.  CT scan with features of diverticulitis.  Follow blood cultures.  Continue IV antibiotics with IV Zosyn at this time..   Acute lactic acidosis Secondary to sepsis with volume depletion.  Continue antibiotics IV fluids monitor lactate serially.  Patient had undergone septic protocol while in the ED.  Continue Ringer lactate 100 mm/h at this  time.  Hypercalcemia Review of previous records showing mild calcium.  Follow vitamin D levels, PTH and PTH related peptide continue hydration.   Type 2 diabetes mellitus with hyperglycemia, without long-term current use of insulin (HCC) Hemoglobin A1c at 7.1.  Continue sliding scale insulin for now.  On Farxiga and glipizide at home   Chronic systolic CHF (congestive heart failure) (HCC) Currently n.p.o. on IV fluids.  Compensated so far.  We will continue to monitor closely.  Takes Lasix 20 mg daily at home.   Essential hypertension Patient is on losartan metoprolol at home.  We will hold.  Continue t on IV as needed antihypertensives     Mixed diabetic hyperlipidemia associated with type 2 diabetes mellitus (HCC) On Lipitor at home.  Will hold for now.   Obesity, morbid, BMI 50 or higher (HCC) Would benefit from weight loss as outpatient    No Charge  Signed,  Tenny Craw, MD [facility]

## 2022-02-06 NOTE — Sepsis Progress Note (Signed)
Elink following code sepsis °

## 2022-02-06 NOTE — Assessment & Plan Note (Signed)
No clinical evidence of cardiogenic volume overload Strict input and output monitoring Daily weights Low-sodium diet  

## 2022-02-06 NOTE — Assessment & Plan Note (Signed)
Lactic acidosis likely secondary to sepsis with concurrent volume depletion Repeat lactic acid today reveals persisting mild lactic acidosis Hydrate patient intravenous isotonic fluids while concurrently treating underlying infection Monitoring serial lactic acid levels to ensure downtrending and resolution.  

## 2022-02-06 NOTE — Assessment & Plan Note (Signed)
   Notable hypercalcemia on admission chemistry  Review of previous labs shows that patient seems to have had a chronic mild hypercalcemia for quite a while  Working this up with vitamin D levels, PTH and PTH related peptide  Hydrating with intravenous isotonic fluids for now  Monitoring calcium levels with serial chemistries

## 2022-02-06 NOTE — Hospital Course (Addendum)
51 year old female with past medical history of hyperlipidemia, diabetes mellitus type 2, hypertension, diastolic congestive heart failure, morbid obesity, iron deficiency anemia presented to the hospital with lower abdominal pain for 1 week with worsening symptoms for couple of days prior to presentation..  Patient also had weakness, fatigue and intermittent headache with poor oral intake.  Patient had initially gone to local urgent care clinic on 01/27/22 and at that time was diagnosed with a urinary tract infection.  Patient given a dose of intramuscular ceftriaxone  and was sent home with a course of cefdinir over the next 10 days.  Patient had mild improvement in her symptoms initially but then started having worsening lower abdominal pain and intermittent fevers and weakness.  In the ED, patient was febrile tachycardic with significant leukocytosis.  Patient underwent CT scan of the abdomen pelvis which showed acute diverticulitis.  Patient was was given cefepime IV and then was considered for admission to the hospital.  General surgery was also consulted during hospitalization due to concerns for involvement of the left adnexa bladder and ureter area. Patient managed on Zosyn IV with improvement in symptoms and leukocytosis. She was then transitioned to Ciprofloxacin and Flagyl

## 2022-02-06 NOTE — Assessment & Plan Note (Signed)
.   Patient been placed on Accu-Cheks before every meal and nightly with sliding scale insulin . Holding home regimen of hypoglycemics . Hemoglobin A1C ordered . Diabetic Diet  

## 2022-02-06 NOTE — Assessment & Plan Note (Signed)
   Patient presenting with evidence of multiple SIRS criteria in the setting of diverticulitis concerning for developing sepsis possibly due to an anaerobic or gram-negative organism from diverticulitis  On broad-spectrum intravenous antibiotics  Hydrating with intravenous fluids  Blood cultures obtained  Remainder of assessment and plan as above

## 2022-02-06 NOTE — Assessment & Plan Note (Signed)
   Patient presenting with nearly 2-week history of lower abdominal pain after symptoms not improved with course of Keflex for suspected urinary tract infection  CT imaging consistent with diverticulitis, complicated by involvement of the left adnexa, course of the left ureter and bladder dome.    Considering this complicated appearance, I discussed the case with surgery who will graciously come evaluate the patient today in consultation.  It is likely that patient will simply require antibiotics and not surgical intervention at this point.  Treated with intravenous Zosyn for now  Hydrating patient with intravenous isotonic fluids  Clear liquid diet  As needed opiate-based analgesics for associated substantial pain.

## 2022-02-06 NOTE — Consult Note (Signed)
Consult Note  Kaylee [name] 1970-12-21  [id number].    Requesting MD: Shauna HughGeorge Shalhoub, MD Chief Complaint/Reason for Consult: complicated diverticulitis HPI:  Patient is a 51 year old female who presented to the ED with a 1 week history of abdominal pain. Pain mostly in lower abdomen. She initially was seen by urgent care 5/25 and started on a course of PO abx. Pain initially improved but then came back and has been constant. Pain exacerbated by PO intake. Denies fevers, although febrile to 101 F here. Initially had some constipation but had a BM in the last few days and reports it was dark in color but no frank blood or mucus noted. Patient also reports foul smelling bloody urine. Denies vaginal discharge or bleeding. She has never experienced anything like this previously. She was unsure whether she has had a colonoscopy before, no report noted in chart. PMH otherwise significant for CHF, HTN, T2DM, and morbid obesity. Prior abdominal surgery includes tubal ligation. NKDA and she is not on any blood thinners at home. She lives with her adult children and is independent for ADLs but does get SOB with exertion. She denies tobacco, alcohol or illicit drug use. She is not currently working but is studying to get her GED.   ROS: Negative other than HPI  Family History  Problem Relation Age of Onset   Leukemia Mother    Breast cancer Maternal Grandmother [id number]    Past Medical History:  Diagnosis Date   CHF (congestive heart failure) (HCC)    Chronic systolic CHF (congestive heart failure) (HCC) 02/06/2022   Diabetes mellitus without complication (HCC)    Essential hypertension 11/30/2020   Heavy menstrual period    Hypercalcemia 02/06/2022   Hypertension    Lymphedema    Mixed diabetic hyperlipidemia associated with type 2 diabetes mellitus (HCC) 02/06/2022   Morbid obesity (HCC)    Obesity, morbid, BMI 50 or higher (HCC) 11/30/2020   Sciatica    Type 2 diabetes mellitus with  hyperglycemia, without long-term current use of insulin (HCC) 11/30/2020    Past Surgical History:  Procedure Laterality Date   s/p of leep cervix     TUBAL LIGATION      Social History:  reports that she has never smoked. She has never used smokeless tobacco. She reports that she does not currently use alcohol. She reports that she does not currently use drugs.  Allergies: No Known Allergies  (Not in a hospital admission)   Blood pressure 110/73, pulse 97, temperature 98.6 F (37 C), temperature source Oral, resp. rate (!) 24, SpO2 100 %. Physical Exam:  General: pleasant, WD, morbidly obese female who is laying in bed in NAD HEENT: head is normocephalic, atraumatic.  Sclera are noninjected. Ears and nose without any masses or lesions.  Mouth is pink and moist Heart: sinus tachycardia in the 100s.  Normal s1,s2. No obvious murmurs, gallops, or rubs noted.  Palpable radial and pedal pulses bilaterally Lungs: CTAB, no wheezes, rhonchi, or rales noted.  Respiratory effort nonlabored Abd: soft, ttp in the LLQ without peritonitis or guarding, ND, unable to auscultate BS, no masses, hernias, or organomegaly MS: all 4 extremities are symmetrical with no cyanosis, clubbing, or edema. Skin: warm and dry with no masses, lesions, or rashes Neuro: Cranial nerves 2-12 grossly intact, sensation is normal throughout Psych: A&Ox3 with an appropriate affect.   Results for orders placed or performed during the hospital encounter of 02/06/22 (from the past 48 hour(s))  Urinalysis, Routine w reflex microscopic Urine, Clean Catch     Status: Abnormal   Collection Time: 02/06/22  1:45 AM  Result Value Ref Range   Color, Urine AMBER (A) YELLOW    Comment: BIOCHEMICALS MAY BE AFFECTED BY COLOR   APPearance CLOUDY (A) CLEAR   Specific Gravity, Urine 1.018 1.005 - 1.030   pH 5.0 5.0 - 8.0   Glucose, UA NEGATIVE NEGATIVE mg/dL   Hgb urine dipstick SMALL (A) NEGATIVE   Bilirubin Urine NEGATIVE NEGATIVE    Ketones, ur 5 (A) NEGATIVE mg/dL   Protein, ur 960 (A) NEGATIVE mg/dL   Nitrite NEGATIVE NEGATIVE   Leukocytes,Ua MODERATE (A) NEGATIVE   RBC / HPF 11-20 0 - 5 RBC/hpf   WBC, UA >50 (H) 0 - 5 WBC/hpf   Bacteria, UA MANY (A) NONE SEEN   Squamous Epithelial / LPF >50 (H) 0 - 5   Mucus PRESENT    Hyaline Casts, UA PRESENT     Comment: Performed at [facility], [address] [place], [place] [address]  Lactic acid, plasma     Status: Abnormal   Collection Time: 02/06/22  1:46 AM  Result Value Ref Range   Lactic Acid, Venous 3.2 (HH) 0.5 - 1.9 mmol/L    Comment: CRITICAL RESULT CALLED TO, READ BACK BY AND VERIFIED WITH: BERRIER C,RN 02/06/22 0244 WAYK Performed at [facility], [address] [place], [place] [address]   Comprehensive metabolic panel     Status: Abnormal   Collection Time: 02/06/22  1:46 AM  Result Value Ref Range   Sodium 133 (L) 135 - 145 mmol/L   Potassium 4.0 3.5 - 5.1 mmol/L   Chloride 100 98 - 111 mmol/L   CO2 22 22 - 32 mmol/L   Glucose, Bld 153 (H) 70 - 99 mg/dL    Comment: Glucose reference range applies only to samples taken after fasting for at least 8 hours.   BUN <5 (L) 6 - 20 mg/dL   Creatinine, Ser 4.78 0.44 - 1.00 mg/dL   Calcium 29.5 (H) 8.9 - 10.3 mg/dL   Total Protein 9.5 (H) 6.5 - 8.1 g/dL   Albumin 3.0 (L) 3.5 - 5.0 g/dL   AST 16 15 - 41 U/L   ALT 13 0 - 44 U/L   Alkaline Phosphatase 102 38 - 126 U/L   Total Bilirubin 0.6 0.3 - 1.2 mg/dL   GFR, Estimated >62 >13 mL/min    Comment: (NOTE) Calculated using the CKD-EPI Creatinine Equation (2021)    Anion gap 11 5 - 15    Comment: Performed at [facility], [address], [place], [place] [address]  CBC with Differential     Status: Abnormal   Collection Time: 02/06/22  1:46 AM  Result Value Ref Range   WBC 19.1 (H) 4.0 - 10.5 K/uL   RBC 4.89 3.87 - 5.11 MIL/uL   Hemoglobin 13.2 12.0 - 15.0 g/dL   HCT 84.6 96.2 - 95.2 %   MCV 87.9 80.0 - 100.0 fL   MCH 27.0 26.0  - 34.0 pg   MCHC 30.7 30.0 - 36.0 g/dL   RDW 84.1 32.4 - 40.1 %   Platelets 443 (H) 150 - 400 K/uL   nRBC 0.0 0.0 - 0.2 %   Neutrophils Relative % 59 %   Neutro Abs 11.2 (H) 1.7 - 7.7 K/uL   Lymphocytes Relative 33 %   Lymphs Abs 6.3 (H) 0.7 - 4.0 K/uL  Monocytes Relative 7 %   Monocytes Absolute 1.4 (H) 0.1 - 1.0 K/uL   Eosinophils Relative 0 %   Eosinophils Absolute 0.1 0.0 - 0.5 K/uL   Basophils Relative 0 %   Basophils Absolute 0.1 0.0 - 0.1 K/uL   Immature Granulocytes 1 %   Abs Immature Granulocytes 0.09 (H) 0.00 - 0.07 K/uL    Comment: Performed at [facility], [address], [place], [place] [address]  I-Stat beta hCG blood, ED     Status: None   Collection Time: 02/06/22  2:18 AM  Result Value Ref Range   I-stat hCG, quantitative <5.0 <5 mIU/mL   Comment 3            Comment:   GEST. AGE      CONC.  (mIU/mL)   <=1 WEEK        5 - 50     2 WEEKS       50 - 500     3 WEEKS       100 - 10,000     4 WEEKS     1,000 - 30,000        FEMALE AND NON-PREGNANT FEMALE:     LESS THAN 5 mIU/mL   Resp Panel by RT-PCR (Flu A&B, Covid) Anterior Nasal Swab     Status: None   Collection Time: 02/06/22  3:23 AM   Specimen: Anterior Nasal Swab  Result Value Ref Range   SARS Coronavirus 2 by RT PCR NEGATIVE NEGATIVE    Comment: (NOTE) SARS-CoV-2 target nucleic acids are NOT DETECTED.  The SARS-CoV-2 RNA is generally detectable in upper respiratory specimens during the acute phase of infection. The lowest concentration of SARS-CoV-2 viral copies this assay can detect is 138 copies/mL. A negative result does not preclude SARS-Cov-2 infection and should not be used as the sole basis for treatment or other patient management decisions. A negative result may occur with  improper specimen collection/handling, submission of specimen other than nasopharyngeal swab, presence of viral mutation(s) within the areas targeted by this assay, and inadequate number of viral copies(<138  copies/mL). A negative result must be combined with clinical observations, patient history, and epidemiological information. The expected result is Negative.  Fact Sheet for Patients:  BloggerCourse.com  Fact Sheet for Healthcare Providers:  SeriousBroker.it  This test is no t yet approved or cleared by the [place] FDA and  has been authorized for detection and/or diagnosis of SARS-CoV-2 by FDA under an Emergency Use Authorization (EUA). This EUA will remain  in effect (meaning this test can be used) for the duration of the COVID-19 declaration under Section 564(b)(1) of the Act, 21 U.S.C.section 360bbb-3(b)(1), unless the authorization is terminated  or revoked sooner.       Influenza A by PCR NEGATIVE NEGATIVE   Influenza B by PCR NEGATIVE NEGATIVE    Comment: (NOTE) The Xpert Xpress SARS-CoV-2/FLU/RSV plus assay is intended as an aid in the diagnosis of influenza from Nasopharyngeal swab specimens and should not be used as a sole basis for treatment. Nasal washings and aspirates are unacceptable for Xpert Xpress SARS-CoV-2/FLU/RSV testing.  Fact Sheet for Patients: BloggerCourse.com  Fact Sheet for Healthcare Providers: SeriousBroker.it  This test is not yet approved or cleared by the [place] FDA and has been authorized for detection and/or diagnosis of SARS-CoV-2 by FDA under an Emergency Use Authorization (EUA). This EUA will remain in effect (meaning this test can be used) for the duration of the  COVID-19 declaration under Section 564(b)(1) of the Act, 21 U.S.C. section 360bbb-3(b)(1), unless the authorization is terminated or revoked.  Performed at [facility], [address] [place], [place] [address]   Protime-INR     Status: None   Collection Time: 02/06/22  5:44 AM  Result Value Ref Range   Prothrombin Time 13.6 11.4 - 15.2 seconds   INR  1.1 0.8 - 1.2    Comment: (NOTE) INR goal varies based on device and disease states. Performed at [facility], [address] [place], [place] [address]   APTT     Status: None   Collection Time: 02/06/22  5:44 AM  Result Value Ref Range   aPTT 29 24 - 36 seconds    Comment: Performed at [facility], [address] [place], [place] [address]  HIV Antibody (routine testing w rflx)     Status: None   Collection Time: 02/06/22  5:44 AM  Result Value Ref Range   HIV Screen 4th Generation wRfx Non Reactive Non Reactive    Comment: Performed at [facility], [address], [place], [place] [address]  VITAMIN D 25 Hydroxy (Vit-D Deficiency, Fractures)     Status: Abnormal   Collection Time: 02/06/22  5:44 AM  Result Value Ref Range   Vit D, 25-Hydroxy 13.55 (L) 30 - 100 ng/mL    Comment: (NOTE) Vitamin D deficiency has been defined by the Institute of Medicine  and an Endocrine Society practice guideline as a level of serum 25-OH  vitamin D less than 20 ng/mL (1,2). The Endocrine Society went on to  further define vitamin D insufficiency as a level between 21 and 29  ng/mL (2).  1. IOM (Institute of Medicine). 2010. Dietary reference intakes for  calcium and D. [place]: [employer]. 2. Holick MF, Binkley Byron, Bischoff-Ferrari HA, et al. Evaluation,  treatment, and prevention of vitamin D deficiency: an Endocrine  Society clinical practice guideline, JCEM. 2011 Jul; 96(7): 1911-30.  Performed at [facility], [address] [place], [place] [address]   Hemoglobin A1c     Status: Abnormal   Collection Time: 02/06/22  5:44 AM  Result Value Ref Range   Hgb A1c MFr Bld 7.1 (H) 4.8 - 5.6 %    Comment: (NOTE) Pre diabetes:          5.7%-6.4%  Diabetes:              >6.4%  Glycemic control for   <7.0% adults with diabetes    Mean Plasma Glucose 157.07 mg/dL    Comment: Performed at [facility], [address],  [place], [place] [address]  Lactic acid, plasma     Status: None   Collection Time: 02/06/22  5:49 AM  Result Value Ref Range   Lactic Acid, Venous 1.8 0.5 - 1.9 mmol/L    Comment: Performed at [facility], [address] [place], [place] [address]  CBG monitoring, ED     Status: Abnormal   Collection Time: 02/06/22  7:47 AM  Result Value Ref Range   Glucose-Capillary 142 (H) 70 - 99 mg/dL    Comment: Glucose reference range applies only to samples taken after fasting for at least 8 hours.   CT ABDOMEN PELVIS W CONTRAST  Result Date: 02/06/2022 CLINICAL DATA:  51 year old female with macroscopic hematuria. EXAM: CT ABDOMEN AND PELVIS WITH CONTRAST TECHNIQUE: Multidetector CT imaging of the abdomen  and pelvis was performed using the standard protocol following bolus administration of intravenous contrast. RADIATION DOSE REDUCTION: This exam was performed according to the departmental dose-optimization program which includes automated exposure control, adjustment of the mA and/or kV according to patient size and/or use of iterative reconstruction technique. CONTRAST:  OMNIPAQUE IOHEXOL 300 MG/ML  SOLN COMPARISON:  None Available. FINDINGS: Lower chest: Large body habitus, otherwise negative. No pericardial or pleural effusion. Hepatobiliary: Probable hepatic steatosis. Otherwise negative liver and gallbladder. Pancreas: Negative. Spleen: Negative. Adrenals/Urinary Tract: Normal adrenal glands. Nonobstructed kidneys with symmetric renal enhancement. However, little to no contrast excretion on 2 minute delayed images. Still, no pararenal inflammation identified. Ureters appear decompressed. However, there is confluent inflammation along the course of the left ureter at the pelvic inlet. Mild secondary inflammation of the bladder including the bladder dome. See intestinal details below. Stomach/Bowel: Negative rectum and distal sigmoid colon. But the proximal sigmoid colon is inflamed and indistinct  along a segment of about 10 cm in the left lower quadrant. Confluent regional mesenteric inflammation contiguous with the left adnexa and bladder dome. Confluent regional retroperitoneal inflammation at the pelvic inlet affecting the course of the left ureter. Underlying diverticulosis. No extraluminal gas identified. No definite extraluminal collection of fluid. Upstream descending colon with additional diverticulosis but decompressed lumen. Diverticulosis also throughout the transverse colon. Right colon mostly decompressed. Normal appendix on series 3, image 59. No dilated small bowel. Little secondary inflammation of small bowel in the left lower quadrant. Decompressed stomach and duodenum. No free fluid identified. Vascular/Lymphatic: Suboptimal intravascular contrast bolus. Major arterial structures grossly patent. Reactive appearing retroperitoneal lymphadenopathy at the renal lower pole levels, somewhat bilateral. But confluent soft tissue inflammation and/or additional asymmetric lymph nodes at the left pelvic inlet, iliac stations. Reproductive: Uterus and right adnexa appear within normal limits, but the left adnexa is inseparable from confluent inflammation in the left lower quadrant (series 3, image 72). No gas within the uterus or vagina. Other: Large body habitus. No pelvic free fluid. Musculoskeletal: Advanced disc and endplate degeneration at the lumbosacral junction. No acute osseous abnormality identified. IMPRESSION: 1. Confluent inflammatory process in the left lower quadrant and left pelvic inlet appears related to a Complicated Sigmoid Diverticulitis. Involvement of the left adnexa, course of the left ureter, and bladder dome. Pelvic inlet and retroperitoneal lymphadenopathy appears reactive. No pneumoperitoneum. No definite extraluminal fluid collection although CT Abdomen and Pelvis with both oral and IV contrast would be more sensitive. 2. Kidneys and ureters appear nonobstructed but there  is little to no renal excretion of contrast on the delayed images, suggesting intrinsic renal insufficiency. 3. Large body habitus, which limits some anatomic detail. Probable hepatic steatosis. Electronically Signed   By: Odessa Fleming M.D.   On: 02/06/2022 04:58   DG Chest Port 1 View  Result Date: 02/06/2022 CLINICAL DATA:  51 year old female with possible sepsis. EXAM: PORTABLE CHEST 1 VIEW COMPARISON:  Chest radiographs 10/12/2018. FINDINGS: Portable AP semi upright view at 0408 hours. Chronic borderline to mild cardiomegaly. Stable cardiac size and mediastinal contours. Visualized tracheal air column is within normal limits. Chronic large body habitus. Allowing for portable technique the lungs are clear. No bowel gas visible in the upper abdomen. No acute osseous abnormality identified. IMPRESSION: Chronic borderline to mild cardiomegaly. No acute cardiopulmonary abnormality. Electronically Signed   By: Odessa Fleming M.D.   On: 02/06/2022 04:24      Assessment/Plan Acute sigmoid diverticulitis with possible involvement of L adnexa and L ureter/bladder dome  -  CT today with above findings, no pneumoperitoneum or abscess - WBC 19, febrile to 101, tachycardic  - ttp in LLQ on abdominal exam without peritonitis  - no indication for emergent surgical intervention  - recommend IV abx and bowel rest, hopefully this will improve with conservative management. If patient does not improve would potentially need to consider Hartmann's procedure - may benefit from CT with rectal contrast to try to identify possible colovesical fistula, but this does not need to be done emergently. Will discuss with colorectal surgeon on Monday  - agree with medical admission and we will follow   FEN: ice chips and sips ok but would not advance past this today, IVF @100  cc/h VTE: LMWH 100 mg daily  ID: Zosyn 6/4>>  - below per TRH -  HTN T2DM Chronic systolic CHF Lymphedema Morbid obesity - BMI likely >50, no weight yet for  this admission  I reviewed ED provider notes, hospitalist notes, last 24 h vitals and pain scores, last 24 h labs and trends, and last 24 h imaging results.   , [facility] 02/06/2022, 10:51 AM Please see Amion for pager number during day hours 7:00am-4:30pm

## 2022-02-06 NOTE — ED Notes (Signed)
Pt was given 3L LR per protocol.

## 2022-02-06 NOTE — Assessment & Plan Note (Signed)
   Once clinically improved we will counsel patient on caloric restriction and regular physical activity.

## 2022-02-06 NOTE — Assessment & Plan Note (Signed)
.   Resume patients home regimen of oral antihypertensives . Titrate antihypertensive regimen as necessary to achieve adequate BP control . PRN intravenous antihypertensives for excessively elevated blood pressure   

## 2022-02-06 NOTE — Assessment & Plan Note (Signed)
.   Continuing home regimen of lipid lowering therapy.  

## 2022-02-06 NOTE — ED Triage Notes (Signed)
Pt reports being treated recently from urgent care for UTI.  Finished anbx and has had return of burning with urination, lower abdominal pain, and hematuria.

## 2022-02-07 DIAGNOSIS — A419 Sepsis, unspecified organism: Secondary | ICD-10-CM

## 2022-02-07 DIAGNOSIS — E871 Hypo-osmolality and hyponatremia: Secondary | ICD-10-CM

## 2022-02-07 LAB — CBC WITH DIFFERENTIAL/PLATELET
Abs Immature Granulocytes: 0.08 10*3/uL — ABNORMAL HIGH (ref 0.00–0.07)
Basophils Absolute: 0.1 10*3/uL (ref 0.0–0.1)
Basophils Relative: 0 %
Eosinophils Absolute: 0.1 10*3/uL (ref 0.0–0.5)
Eosinophils Relative: 0 %
HCT: 36 % (ref 36.0–46.0)
Hemoglobin: 11 g/dL — ABNORMAL LOW (ref 12.0–15.0)
Immature Granulocytes: 1 %
Lymphocytes Relative: 20 %
Lymphs Abs: 3.4 10*3/uL (ref 0.7–4.0)
MCH: 26.6 pg (ref 26.0–34.0)
MCHC: 30.6 g/dL (ref 30.0–36.0)
MCV: 87 fL (ref 80.0–100.0)
Monocytes Absolute: 1.3 10*3/uL — ABNORMAL HIGH (ref 0.1–1.0)
Monocytes Relative: 7 %
Neutro Abs: 12.2 10*3/uL — ABNORMAL HIGH (ref 1.7–7.7)
Neutrophils Relative %: 72 %
Platelets: 365 10*3/uL (ref 150–400)
RBC: 4.14 MIL/uL (ref 3.87–5.11)
RDW: 14.9 % (ref 11.5–15.5)
WBC: 17 10*3/uL — ABNORMAL HIGH (ref 4.0–10.5)
nRBC: 0 % (ref 0.0–0.2)

## 2022-02-07 LAB — URINE CULTURE

## 2022-02-07 LAB — GLUCOSE, CAPILLARY
Glucose-Capillary: 124 mg/dL — ABNORMAL HIGH (ref 70–99)
Glucose-Capillary: 156 mg/dL — ABNORMAL HIGH (ref 70–99)
Glucose-Capillary: 113 mg/dL — ABNORMAL HIGH (ref 70–99)
Glucose-Capillary: 113 mg/dL — ABNORMAL HIGH (ref 70–99)

## 2022-02-07 LAB — COMPREHENSIVE METABOLIC PANEL
ALT: 11 U/L (ref 0–44)
AST: 13 U/L — ABNORMAL LOW (ref 15–41)
Albumin: 2.6 g/dL — ABNORMAL LOW (ref 3.5–5.0)
Alkaline Phosphatase: 82 U/L (ref 38–126)
Anion gap: 4 — ABNORMAL LOW (ref 5–15)
BUN: 8 mg/dL (ref 6–20)
CO2: 23 mmol/L (ref 22–32)
Calcium: 9.7 mg/dL (ref 8.9–10.3)
Chloride: 104 mmol/L (ref 98–111)
Creatinine, Ser: 0.89 mg/dL (ref 0.44–1.00)
GFR, Estimated: >60 mL/min (ref >60)
Glucose, Bld: 126 mg/dL — ABNORMAL HIGH (ref 70–99)
Potassium: 3.8 mmol/L (ref 3.5–5.1)
Sodium: 131 mmol/L — ABNORMAL LOW (ref 135–145)
Total Bilirubin: 0.8 mg/dL (ref 0.3–1.2)
Total Protein: 8.2 g/dL — ABNORMAL HIGH (ref 6.5–8.1)

## 2022-02-07 LAB — PARATHYROID HORMONE, INTACT (NO CA): PTH: 39 pg/mL (ref 15–65)

## 2022-02-07 LAB — MAGNESIUM: Magnesium: 1.8 mg/dL (ref 1.7–2.4)

## 2022-02-07 LAB — CALCITRIOL (1,25 DI-OH VIT D): Vit D, 1,25-Dihydroxy: 65.8 pg/mL (ref 24.8–81.5)

## 2022-02-07 MED ORDER — BOOST / RESOURCE BREEZE PO LIQD CUSTOM
1.0000 | Freq: Three times a day (TID) | ORAL | Status: DC
  Administered 2022-02-08 – 2022-02-11 (×7): 1 via ORAL

## 2022-02-07 NOTE — Plan of Care (Signed)
  Problem: Education: Goal: Ability to describe self-care measures that may prevent or decrease complications (Diabetes Survival Skills Education) will improve Outcome: Progressing   Problem: Coping: Goal: Ability to adjust to condition or change in health will improve Outcome: Progressing   Problem: Health Behavior/Discharge Planning: Goal: Ability to identify and utilize available resources and services will improve Outcome: Progressing   

## 2022-02-07 NOTE — Plan of Care (Signed)
  Problem: Fluid Volume: Goal: Ability to maintain a balanced intake and output will improve Outcome: Not Progressing   Problem: Nutritional: Goal: Maintenance of adequate nutrition will improve Outcome: Not Progressing   Problem: Tissue Perfusion: Goal: Adequacy of tissue perfusion will improve Outcome: Not Progressing   Problem: Fluid Volume: Goal: Hemodynamic stability will improve Outcome: Not Progressing

## 2022-02-07 NOTE — Progress Notes (Addendum)
PROGRESS NOTE    Kaylee [name]  KDX:[id number] DOB: June 28, 1971 DOA: 02/06/2022 PCP: Storm Frisk, MD    Brief Narrative:  51 year old female with past medical history of hyperlipidemia, diabetes mellitus type 2, hypertension, diastolic congestive heart failure, morbid obesity, iron deficiency anemia presented to hospital with lower abdominal pain for 1 week with worsening symptoms for couple of days.  He also had weakness fatigue and intermittent headache with poor oral intake.  Patient had initially gone to local urgent care clinic on 5/25 and at that time was diagnosed with a urinary tract infection.  Patient given a dose of intramuscular ceftriaxone in clinic and was sent home with a course of cefdinir over the next 10 days.  Patient had mild improvement in her symptoms but then started having worsening lower abdominal pain and intermittent fevers and weakness.  In the ED, patient was febrile tachycardic with significant leukocytosis.  Patient underwent CT scan of the abdomen pelvis which showed a diabetes colitis.  She was was given cefepime IV and then was considered for admission to the hospital.  General surgery was also consulted during hospitalization.    Assessment and Plan. Principal Problem:   Diverticulitis large intestine w/o perforation or abscess w/o bleeding Active Problems:   Sepsis (HCC)   Lactic acidosis   Hypercalcemia   Type 2 diabetes mellitus with hyperglycemia, without long-term current use of insulin (HCC)   Chronic systolic CHF (congestive heart failure) (HCC)   Essential hypertension   Mixed diabetic hyperlipidemia associated with type 2 diabetes mellitus (HCC)   Obesity, morbid, BMI 50 or higher (HCC)   Hyponatremia   Acute diverticulitis large intestine w/o perforation or abscess w/o bleeding Several days of abdominal pain prior to presentation.  CT scan consistent with diverticulitis complicated by involvement of the left adnexa, course of the left  ureter and bladder dome.  General surgery was consulted for this finding and at this time no surgical intervention planned.  Continue IV antibiotic with Zosyn, continue IV fluids, n.p.o. for now as per general surgery  Sepsis secondary to diverticulitis, UTI Patient presented with fever of 101.3 F, tachycardia, elevated lactate, tachypnea on presentation with significant leukocytosis.  Urinalysis positive for many bacteria and more than 50 white cells with negative nitrate.  CT scan with features of acute diverticulitis.  Pending blood cultures.  Continue IV antibiotics with IV Zosyn at this time.  Significant leukocytosis at 17 K with a temperature max of 100.1 F.  WBC trending down.  Currently n.p.o. but surgery recommends starting clears starting today.   Acute lactic acidosis Secondary to sepsis with volume depletion.  Continue antibiotics IV fluids. Patient had undergone septic protocol while in the ED.  Continue Ringer lactate 100 ml/h at this time.  Hypercalcemia Review of previous records showing mild hypocalcemia.  Follow vitamin D levels, PTH and PTH related peptide, continue hydration.   Type 2 diabetes mellitus with hyperglycemia, without long-term current use of insulin (HCC) Hemoglobin A1c at 7.1.  Continue sliding scale insulin for now.  On Farxiga and glipizide at home   Chronic systolic CHF (congestive heart failure) (HCC) Currently n.p.o. on IV fluids.  Compensated so far. On IV Lasix 20 mg daily at home.   Essential hypertension Patient is on losartan metoprolol at home.  Resumed.  Mixed diabetic hyperlipidemia associated with type 2 diabetes mellitus (HCC) On Lipitor at home.  Has been resumed  Obesity, morbid, BMI 50 or higher (HCC) Would benefit from weight loss as outpatient  Hyponatremia mild.  Continue IV fluids.  Check BMP in AM.     DVT prophylaxis:  lovenox subq   Code Status:     Code Status: Full Code  Disposition: Home  Status is:  Inpatient  Remains inpatient appropriate because: IV antibiotics, surgical follow-up, n.p.o. status   Family Communication: Communicated with the patient at bedside  Consultants:  General surgery  Procedures:  None  Antimicrobials:  Zosyn 6/4>  Anti-infectives (From admission, onward)    Start     Dose/Rate Route Frequency Ordered Stop   02/06/22 0800  piperacillin-tazobactam (ZOSYN) IVPB 3.375 g        3.375 g 12.5 mL/hr over 240 Minutes Intravenous Every 8 hours 02/06/22 0546     02/06/22 0330  cefTRIAXone (ROCEPHIN) 2 g in sodium chloride 0.9 % 100 mL IVPB  Status:  Discontinued        2 g 200 mL/hr over 30 Minutes Intravenous Every 24 hours 02/06/22 0324 02/06/22 0327   02/06/22 0330  ceFEPIme (MAXIPIME) 2 g in sodium chloride 0.9 % 100 mL IVPB        2 g 200 mL/hr over 30 Minutes Intravenous  Once 02/06/22 0327 02/06/22 0409      Subjective: Today, patient was seen and examined at bedside.  Patient complains of decreased pain in the abdomen.  Has not had a bowel movement.  No nausea or vomiting.  No fever chills or rigor  Objective: Vitals:   02/07/22 0452 02/07/22 0719 02/07/22 0734 02/07/22 0800  BP: 108/66 (!) 110/58 (!) 110/58   Pulse: 99 98 96   Resp:  18 18   Temp: 99.9 F (37.7 C) 99 F (37.2 C)    TempSrc: Oral Oral    SpO2: 93% 98% 97%   Weight:      Height:    5\' 7"  (1.702 m)    Intake/Output Summary (Last 24 hours) at 02/07/2022 0947 Last data filed at 02/07/2022 0300 Gross per 24 hour  Intake 1511.48 ml  Output 200 ml  Net 1311.48 ml   Filed Weights   02/06/22 2007 02/07/22 0346  Weight: (!) 196.3 kg (!) 196.8 kg    Physical Examination: Body mass index is 67.96 kg/m.   General: Morbidly obese built, not in obvious distress HENT:   No scleral pallor or icterus noted. Oral mucosa is moist.  Chest:  Clear breath sounds.  Diminished breath sounds bilaterally. No crackles or wheezes.  CVS: S1 &S2 heard. No murmur.  Regular rate and  rhythm. Abdomen: Soft, left lower quadrant tenderness on deep palpation.  Nondistended.  Bowel sounds are heard.   Extremities: No cyanosis, clubbing or edema.  Peripheral pulses are palpable. Psych: Alert, awake and oriented, normal mood CNS:  No cranial nerve deficits.  Power equal in all extremities.   Skin: Warm and dry.  No rashes noted.  Data Reviewed:   CBC: Recent Labs  Lab 02/06/22 0146 02/07/22 0642  WBC 19.1* 17.0*  NEUTROABS 11.2* 12.2*  HGB 13.2 11.0*  HCT 43.0 36.0  MCV 87.9 87.0  PLT 443* 365    Basic Metabolic Panel: Recent Labs  Lab 02/06/22 0146 02/07/22 0642  NA 133* 131*  K 4.0 3.8  CL 100 104  CO2 22 23  GLUCOSE 153* 126*  BUN <5* 8  CREATININE 0.74 0.89  CALCIUM 10.4* 9.7  MG  --  1.8    Liver Function Tests: Recent Labs  Lab 02/06/22 0146 02/07/22 0642  AST 16 13*  ALT  13 11  ALKPHOS 102 82  BILITOT 0.6 0.8  PROT 9.5* 8.2*  ALBUMIN 3.0* 2.6*     Radiology Studies: CT ABDOMEN PELVIS W CONTRAST  Result Date: 02/06/2022 CLINICAL DATA:  51 year old female with macroscopic hematuria. EXAM: CT ABDOMEN AND PELVIS WITH CONTRAST TECHNIQUE: Multidetector CT imaging of the abdomen and pelvis was performed using the standard protocol following bolus administration of intravenous contrast. RADIATION DOSE REDUCTION: This exam was performed according to the departmental dose-optimization program which includes automated exposure control, adjustment of the mA and/or kV according to patient size and/or use of iterative reconstruction technique. CONTRAST:  OMNIPAQUE IOHEXOL 300 MG/ML  SOLN COMPARISON:  None Available. FINDINGS: Lower chest: Large body habitus, otherwise negative. No pericardial or pleural effusion. Hepatobiliary: Probable hepatic steatosis. Otherwise negative liver and gallbladder. Pancreas: Negative. Spleen: Negative. Adrenals/Urinary Tract: Normal adrenal glands. Nonobstructed kidneys with symmetric renal enhancement. However, little  to no contrast excretion on 2 minute delayed images. Still, no pararenal inflammation identified. Ureters appear decompressed. However, there is confluent inflammation along the course of the left ureter at the pelvic inlet. Mild secondary inflammation of the bladder including the bladder dome. See intestinal details below. Stomach/Bowel: Negative rectum and distal sigmoid colon. But the proximal sigmoid colon is inflamed and indistinct along a segment of about 10 cm in the left lower quadrant. Confluent regional mesenteric inflammation contiguous with the left adnexa and bladder dome. Confluent regional retroperitoneal inflammation at the pelvic inlet affecting the course of the left ureter. Underlying diverticulosis. No extraluminal gas identified. No definite extraluminal collection of fluid. Upstream descending colon with additional diverticulosis but decompressed lumen. Diverticulosis also throughout the transverse colon. Right colon mostly decompressed. Normal appendix on series 3, image 59. No dilated small bowel. Little secondary inflammation of small bowel in the left lower quadrant. Decompressed stomach and duodenum. No free fluid identified. Vascular/Lymphatic: Suboptimal intravascular contrast bolus. Major arterial structures grossly patent. Reactive appearing retroperitoneal lymphadenopathy at the renal lower pole levels, somewhat bilateral. But confluent soft tissue inflammation and/or additional asymmetric lymph nodes at the left pelvic inlet, iliac stations. Reproductive: Uterus and right adnexa appear within normal limits, but the left adnexa is inseparable from confluent inflammation in the left lower quadrant (series 3, image 72). No gas within the uterus or vagina. Other: Large body habitus. No pelvic free fluid. Musculoskeletal: Advanced disc and endplate degeneration at the lumbosacral junction. No acute osseous abnormality identified. IMPRESSION: 1. Confluent inflammatory process in the left  lower quadrant and left pelvic inlet appears related to a Complicated Sigmoid Diverticulitis. Involvement of the left adnexa, course of the left ureter, and bladder dome. Pelvic inlet and retroperitoneal lymphadenopathy appears reactive. No pneumoperitoneum. No definite extraluminal fluid collection although CT Abdomen and Pelvis with both oral and IV contrast would be more sensitive. 2. Kidneys and ureters appear nonobstructed but there is little to no renal excretion of contrast on the delayed images, suggesting intrinsic renal insufficiency. 3. Large body habitus, which limits some anatomic detail. Probable hepatic steatosis. Electronically Signed   By: Odessa Fleming M.D.   On: 02/06/2022 04:58   DG Chest Port 1 View  Result Date: 02/06/2022 CLINICAL DATA:  51 year old female with possible sepsis. EXAM: PORTABLE CHEST 1 VIEW COMPARISON:  Chest radiographs 10/12/2018. FINDINGS: Portable AP semi upright view at 0408 hours. Chronic borderline to mild cardiomegaly. Stable cardiac size and mediastinal contours. Visualized tracheal air column is within normal limits. Chronic large body habitus. Allowing for portable technique the lungs are clear. No bowel  gas visible in the upper abdomen. No acute osseous abnormality identified. IMPRESSION: Chronic borderline to mild cardiomegaly. No acute cardiopulmonary abnormality. Electronically Signed   By: Odessa Fleming M.D.   On: 02/06/2022 04:24      LOS: 1 day    Joycelyn Das, MD [facility] Available via Epic secure chat 7am-7pm After these hours, please refer to coverage provider listed on [email or web address] 02/07/2022, 9:47 AM

## 2022-02-07 NOTE — Progress Notes (Signed)
Initial Nutrition Assessment  DOCUMENTATION CODES:   Morbid obesity  INTERVENTION:  Provide Boost Breeze po TID, each supplement provides 250 kcal and 9 grams of protein  Encourage PO intake.   NUTRITION DIAGNOSIS:   Inadequate oral intake related to altered GI function as evidenced by meal completion < 50%.  GOAL:   Patient will meet greater than or equal to 90% of their needs  MONITOR:   PO intake, Supplement acceptance, Labs, Weight trends, Skin, I & O's, Diet advancement  REASON FOR ASSESSMENT:   Malnutrition Screening Tool    ASSESSMENT:   51 year old female with past medical history of hyperlipidemia, diabetes mellitus type 2, hypertension, diastolic congestive heart failure, morbid obesity, iron deficiency anemia who presented with weakness, abdominal pains with 1 week. Pt with acute diverticulitis large intestine, sepsis secondary to diverticulitis, UTI.  Pt reports abdominal pains have been improving. Pt is currently on a clear liquid diet and has been tolerating her liquids thus far. Pt reports poor po intake for 1 week and had been only able to take a couple bites of food at meals. RD to order nutritional supplements to aid in caloric and protein needs.   NUTRITION - FOCUSED PHYSICAL EXAM:  Flowsheet Row Most Recent Value  Orbital Region No depletion  Upper Arm Region No depletion  Thoracic and Lumbar Region No depletion  Buccal Region No depletion  Temple Region No depletion  Clavicle Bone Region No depletion  Clavicle and Acromion Bone Region No depletion  Scapular Bone Region No depletion  Dorsal Hand No depletion  Patellar Region No depletion  Anterior Thigh Region No depletion  Posterior Calf Region No depletion  Edema (RD Assessment) None  Hair Reviewed  Eyes Reviewed  Mouth Reviewed  Skin Reviewed  Nails Reviewed      Labs and medications reviewed.   Diet Order:   Diet Order             Diet clear liquid Room service appropriate? Yes;  Fluid consistency: Thin  Diet effective now                   EDUCATION NEEDS:   Not appropriate for education at this time  Skin:  Skin Assessment: Reviewed RN Assessment  Last BM:  6/2  Height:   Ht Readings from Last 1 Encounters:  02/07/22 5\' 7"  (1.702 m)    Weight:   Wt Readings from Last 1 Encounters:  02/07/22 (!) 196.8 kg   BMI:  Body mass index is 67.96 kg/m.  Estimated Nutritional Needs:   Kcal:  2000-2200  Protein:  120-130 grams  Fluid:  >/=2 L/day  Corrin Parker, MS, RD, LDN RD pager number/after hours weekend pager number on Amion.

## 2022-02-07 NOTE — Progress Notes (Signed)
Mobility Specialist Progress Note    02/07/22 1510  Mobility  Activity Ambulated with assistance in hallway  Level of Assistance Standby assist, set-up cues, supervision of patient - no hands on  Assistive Device Other (Comment) (walls, hallway railings)  Distance Ambulated (ft) 170 ft  Activity Response Tolerated well  $Mobility charge 1 Mobility   Pre-Mobility: 76 HR  Pt received in bed and agreeable. No complaints on walk. Returned to chair with call bell in reach.    [place] Mobility Specialist

## 2022-02-07 NOTE — Progress Notes (Addendum)
Subjective: Feeling better today.  Denies pneumaturia or stool in urine.  No nausea or vomiting.  + flatus  ROS: See above, otherwise other systems negative  Objective: Vital signs in last 24 hours: Temp:  [97.9 F (36.6 C)-100.1 F (37.8 C)] 99 F (37.2 C) (06/05 0719) Pulse Rate:  [78-109] 96 (06/05 0734) Resp:  [12-27] 18 (06/05 0734) BP: (94-115)/(58-73) 110/58 (06/05 0734) SpO2:  [85 %-100 %] 97 % (06/05 0734) Weight:  [196.3 kg-196.8 kg] 196.8 kg (06/05 0346) Last BM Date : 02/04/22  Intake/Output from previous day: 06/04 0701 - 06/05 0700 In: 1511.5 [I.V.:1399.8; IV Piggyback:111.7] Out: 200 [Urine:200] Intake/Output this shift: No intake/output data recorded.  PE: Heart: regular Lungs: CTAB Abd: soft, mildly tender in suprapubic region, morbidly obese, +BS, no peritonitis   Lab Results:  Recent Labs    02/06/22 0146 02/07/22 0642  WBC 19.1* 17.0*  HGB 13.2 11.0*  HCT 43.0 36.0  PLT 443* 365   BMET Recent Labs    02/06/22 0146 02/07/22 0642  NA 133* 131*  K 4.0 3.8  CL 100 104  CO2 22 23  GLUCOSE 153* 126*  BUN <5* 8  CREATININE 0.74 0.89  CALCIUM 10.4* 9.7   PT/INR Recent Labs    02/06/22 0544  LABPROT 13.6  INR 1.1   CMP     Component Value Date/Time   NA 131 (L) 02/07/2022 0642   NA 137 06/01/2021 1125   K 3.8 02/07/2022 0642   CL 104 02/07/2022 0642   CO2 23 02/07/2022 0642   GLUCOSE 126 (H) 02/07/2022 0642   BUN 8 02/07/2022 0642   BUN 4 (L) 06/01/2021 1125   CREATININE 0.89 02/07/2022 0642   CALCIUM 9.7 02/07/2022 0642   PROT 8.2 (H) 02/07/2022 0642   PROT 7.9 06/01/2021 1125   ALBUMIN 2.6 (L) 02/07/2022 0642   ALBUMIN 3.7 (L) 06/01/2021 1125   AST 13 (L) 02/07/2022 0642   ALT 11 02/07/2022 0642   ALKPHOS 82 02/07/2022 0642   BILITOT 0.8 02/07/2022 0642   BILITOT <0.2 06/01/2021 1125   GFRNONAA >60 02/07/2022 0642   GFRAA 131 07/22/2020 0907   Lipase  No results found for:  LIPASE     Studies/Results: CT ABDOMEN PELVIS W CONTRAST  Result Date: 02/06/2022 CLINICAL DATA:  51 year old female with macroscopic hematuria. EXAM: CT ABDOMEN AND PELVIS WITH CONTRAST TECHNIQUE: Multidetector CT imaging of the abdomen and pelvis was performed using the standard protocol following bolus administration of intravenous contrast. RADIATION DOSE REDUCTION: This exam was performed according to the departmental dose-optimization program which includes automated exposure control, adjustment of the mA and/or kV according to patient size and/or use of iterative reconstruction technique. CONTRAST:  100mL OMNIPAQUE IOHEXOL 300 MG/ML  SOLN COMPARISON:  None Available. FINDINGS: Lower chest: Large body habitus, otherwise negative. No pericardial or pleural effusion. Hepatobiliary: Probable hepatic steatosis. Otherwise negative liver and gallbladder. Pancreas: Negative. Spleen: Negative. Adrenals/Urinary Tract: Normal adrenal glands. Nonobstructed kidneys with symmetric renal enhancement. However, little to no contrast excretion on 2 minute delayed images. Still, no pararenal inflammation identified. Ureters appear decompressed. However, there is confluent inflammation along the course of the left ureter at the pelvic inlet. Mild secondary inflammation of the bladder including the bladder dome. See intestinal details below. Stomach/Bowel: Negative rectum and distal sigmoid colon. But the proximal sigmoid colon is inflamed and indistinct along a segment of about 10 cm in the left lower quadrant. Confluent regional mesenteric inflammation contiguous with the  left adnexa and bladder dome. Confluent regional retroperitoneal inflammation at the pelvic inlet affecting the course of the left ureter. Underlying diverticulosis. No extraluminal gas identified. No definite extraluminal collection of fluid. Upstream descending colon with additional diverticulosis but decompressed lumen. Diverticulosis also throughout  the transverse colon. Right colon mostly decompressed. Normal appendix on series 3, image 59. No dilated small bowel. Little secondary inflammation of small bowel in the left lower quadrant. Decompressed stomach and duodenum. No free fluid identified. Vascular/Lymphatic: Suboptimal intravascular contrast bolus. Major arterial structures grossly patent. Reactive appearing retroperitoneal lymphadenopathy at the renal lower pole levels, somewhat bilateral. But confluent soft tissue inflammation and/or additional asymmetric lymph nodes at the left pelvic inlet, iliac stations. Reproductive: Uterus and right adnexa appear within normal limits, but the left adnexa is inseparable from confluent inflammation in the left lower quadrant (series 3, image 72). No gas within the uterus or vagina. Other: Large body habitus. No pelvic free fluid. Musculoskeletal: Advanced disc and endplate degeneration at the lumbosacral junction. No acute osseous abnormality identified. IMPRESSION: 1. Confluent inflammatory process in the left lower quadrant and left pelvic inlet appears related to a Complicated Sigmoid Diverticulitis. Involvement of the left adnexa, course of the left ureter, and bladder dome. Pelvic inlet and retroperitoneal lymphadenopathy appears reactive. No pneumoperitoneum. No definite extraluminal fluid collection although CT Abdomen and Pelvis with both oral and IV contrast would be more sensitive. 2. Kidneys and ureters appear nonobstructed but there is little to no renal excretion of contrast on the delayed images, suggesting intrinsic renal insufficiency. 3. Large body habitus, which limits some anatomic detail. Probable hepatic steatosis. Electronically Signed   By: Odessa Fleming M.D.   On: 02/06/2022 04:58   DG Chest Port 1 View  Result Date: 02/06/2022 CLINICAL DATA:  51 year old female with possible sepsis. EXAM: PORTABLE CHEST 1 VIEW COMPARISON:  Chest radiographs 10/12/2018. FINDINGS: Portable AP semi upright view  at 0408 hours. Chronic borderline to mild cardiomegaly. Stable cardiac size and mediastinal contours. Visualized tracheal air column is within normal limits. Chronic large body habitus. Allowing for portable technique the lungs are clear. No bowel gas visible in the upper abdomen. No acute osseous abnormality identified. IMPRESSION: Chronic borderline to mild cardiomegaly. No acute cardiopulmonary abnormality. Electronically Signed   By: Odessa Fleming M.D.   On: 02/06/2022 04:24    Anti-infectives: Anti-infectives (From admission, onward)    Start     Dose/Rate Route Frequency Ordered Stop   02/06/22 0800  piperacillin-tazobactam (ZOSYN) IVPB 3.375 g        3.375 g 12.5 mL/hr over 240 Minutes Intravenous Every 8 hours 02/06/22 0546     02/06/22 0330  cefTRIAXone (ROCEPHIN) 2 g in sodium chloride 0.9 % 100 mL IVPB  Status:  Discontinued        2 g 200 mL/hr over 30 Minutes Intravenous Every 24 hours 02/06/22 0324 02/06/22 0327   02/06/22 0330  ceFEPIme (MAXIPIME) 2 g in sodium chloride 0.9 % 100 mL IVPB        2 g 200 mL/hr over 30 Minutes Intravenous  Once 02/06/22 0327 02/06/22 0409        Assessment/Plan Acute sigmoid diverticulitis with possible involvement of L adnexa and L ureter/bladder dome  - cont IV abx, WBC down to 17K today -start CLD today due to pain improvement. hopefully this will improve with conservative management. If patient does not improve would potentially need to consider Hartmann's procedure - no need for CT with rectal contrast as even if  she has a colovesical fistula, it would not change management currently.  She also has no clinical signs of one currently, although her UA is dirty.  Cx in process   FEN: CLD/IVFs VTE: LMWH 100 mg daily  ID: Zosyn 6/4>>   - below per TRH -  HTN T2DM Chronic systolic CHF Lymphedema Morbid obesity - BMI likely >50, no weight yet for this admission  I reviewed hospitalist notes, last 24 h vitals and pain scores, last 48 h intake  and output, last 24 h labs and trends, and last 24 h imaging results.   LOS: 1 day    Letha Cape , [facility] Surgery 02/07/2022, 7:47 AM Please see Amion for pager number during day hours 7:00am-4:30pm or 7:00am -11:30am on weekends

## 2022-02-08 LAB — BASIC METABOLIC PANEL
Anion gap: 8 (ref 5–15)
BUN: 10 mg/dL (ref 6–20)
CO2: 24 mmol/L (ref 22–32)
Calcium: 9.7 mg/dL (ref 8.9–10.3)
Chloride: 100 mmol/L (ref 98–111)
Creatinine, Ser: 0.83 mg/dL (ref 0.44–1.00)
GFR, Estimated: >60 mL/min (ref >60)
Glucose, Bld: 119 mg/dL — ABNORMAL HIGH (ref 70–99)
Potassium: 3.7 mmol/L (ref 3.5–5.1)
Sodium: 132 mmol/L — ABNORMAL LOW (ref 135–145)

## 2022-02-08 LAB — CBC
HCT: 37.8 % (ref 36.0–46.0)
Hemoglobin: 11.4 g/dL — ABNORMAL LOW (ref 12.0–15.0)
MCH: 26.5 pg (ref 26.0–34.0)
MCHC: 30.2 g/dL (ref 30.0–36.0)
MCV: 87.9 fL (ref 80.0–100.0)
Platelets: 359 10*3/uL (ref 150–400)
RBC: 4.3 MIL/uL (ref 3.87–5.11)
RDW: 14.8 % (ref 11.5–15.5)
WBC: 15.3 10*3/uL — ABNORMAL HIGH (ref 4.0–10.5)
nRBC: 0 % (ref 0.0–0.2)

## 2022-02-08 LAB — GLUCOSE, CAPILLARY
Glucose-Capillary: 156 mg/dL — ABNORMAL HIGH (ref 70–99)
Glucose-Capillary: 144 mg/dL — ABNORMAL HIGH (ref 70–99)
Glucose-Capillary: 131 mg/dL — ABNORMAL HIGH (ref 70–99)
Glucose-Capillary: 125 mg/dL — ABNORMAL HIGH (ref 70–99)

## 2022-02-08 LAB — MAGNESIUM: Magnesium: 1.9 mg/dL (ref 1.7–2.4)

## 2022-02-08 MED ORDER — VITAMIN D (ERGOCALCIFEROL) 1.25 MG (50000 UNIT) PO CAPS
50000.0000 [IU] | ORAL_CAPSULE | ORAL | Status: DC
Start: 2022-02-08 — End: 2022-02-11
  Administered 2022-02-08: 50000 [IU] via ORAL
  Filled 2022-02-08: qty 1

## 2022-02-08 NOTE — Progress Notes (Signed)
Subjective: Feeling better each day.  Denies pneumaturia or stool in urine.  No nausea or vomiting.  + flatus  ROS: See above, otherwise other systems negative  Objective: Vital signs in last 24 hours: Temp:  [98 F (36.7 C)-99.3 F (37.4 C)] 98.5 F (36.9 C) (06/06 1455) Pulse Rate:  [94-106] 100 (06/06 1455) Resp:  [17-18] 17 (06/06 1455) BP: (98-140)/(64-75) 102/68 (06/06 1455) SpO2:  [86 %-100 %] 94 % (06/06 1455) Last BM Date : 02/03/22  Intake/Output from previous day: 06/05 0701 - 06/06 0700 In: 1010 [P.O.:960; IV Piggyback:50] Out: 1100 [Urine:1100] Intake/Output this shift: Total I/O In: 240 [P.O.:240] Out: -   PE: Heart: regular Lungs: CTAB Abd: soft, mildly tender in suprapubic region, morbidly obese, +BS, no peritonitis   Lab Results:  Recent Labs    02/07/22 0642 02/08/22 0436  WBC 17.0* 15.3*  HGB 11.0* 11.4*  HCT 36.0 37.8  PLT 365 359   BMET Recent Labs    02/07/22 0642 02/08/22 0436  NA 131* 132*  K 3.8 3.7  CL 104 100  CO2 23 24  GLUCOSE 126* 119*  BUN 8 10  CREATININE 0.89 0.83  CALCIUM 9.7 9.7   PT/INR Recent Labs    02/06/22 0544  LABPROT 13.6  INR 1.1   CMP     Component Value Date/Time   NA 132 (L) 02/08/2022 0436   NA 137 06/01/2021 1125   K 3.7 02/08/2022 0436   CL 100 02/08/2022 0436   CO2 24 02/08/2022 0436   GLUCOSE 119 (H) 02/08/2022 0436   BUN 10 02/08/2022 0436   BUN 4 (L) 06/01/2021 1125   CREATININE 0.83 02/08/2022 0436   CALCIUM 9.7 02/08/2022 0436   PROT 8.2 (H) 02/07/2022 0642   PROT 7.9 06/01/2021 1125   ALBUMIN 2.6 (L) 02/07/2022 0642   ALBUMIN 3.7 (L) 06/01/2021 1125   AST 13 (L) 02/07/2022 0642   ALT 11 02/07/2022 0642   ALKPHOS 82 02/07/2022 0642   BILITOT 0.8 02/07/2022 0642   BILITOT <0.2 06/01/2021 1125   GFRNONAA >60 02/08/2022 0436   GFRAA 131 07/22/2020 0907   Lipase  No results found for: LIPASE     Studies/Results: No results  found.  Anti-infectives: Anti-infectives (From admission, onward)    Start     Dose/Rate Route Frequency Ordered Stop   02/06/22 0800  piperacillin-tazobactam (ZOSYN) IVPB 3.375 g        3.375 g 12.5 mL/hr over 240 Minutes Intravenous Every 8 hours 02/06/22 0546     02/06/22 0330  cefTRIAXone (ROCEPHIN) 2 g in sodium chloride 0.9 % 100 mL IVPB  Status:  Discontinued        2 g 200 mL/hr over 30 Minutes Intravenous Every 24 hours 02/06/22 0324 02/06/22 0327   02/06/22 0330  ceFEPIme (MAXIPIME) 2 g in sodium chloride 0.9 % 100 mL IVPB        2 g 200 mL/hr over 30 Minutes Intravenous  Once 02/06/22 0327 02/06/22 0409        Assessment/Plan Acute sigmoid diverticulitis with possible involvement of L adnexa and L ureter/bladder dome  - cont IV abx, WBC down to 17K today -start CLD today due to pain improvement. hopefully this will improve with conservative management. If patient does not improve would potentially need to consider Hartmann's procedure - no need for CT with rectal contrast as even if she has a colovesical fistula, it would not change management currently.  She also  has no clinical signs of one currently, although her UA is dirty.  Cx pending   FEN: Bariatric full liquids, MIVF VTE: LMWH 100 mg daily  ID: Zosyn 6/4>>   - below per TRH -  HTN T2DM Chronic systolic CHF Lymphedema Morbid obesity - BMI likely >50, no weight yet for this admission  I reviewed hospitalist notes, last 24 h vitals and pain scores, last 48 h intake and output, last 24 h labs and trends, and last 24 h imaging results.   LOS: 2 days   Marin Olp, MD [facility], [facility]

## 2022-02-08 NOTE — Progress Notes (Signed)
Mobility Specialist Progress Note:   02/08/22 1530  Mobility  Activity Ambulated with assistance in hallway  Level of Assistance Standby assist, set-up cues, supervision of patient - no hands on  Assistive Device  (Hall rails)  Distance Ambulated (ft) 170 ft  Activity Response Tolerated well  $Mobility charge 1 Mobility   Pt received in bed willing to participate in mobility. No complaints of pain. Left in chair with call bell in reach and all needs met.   [facility] Rella Egelston Mobility Specialist

## 2022-02-08 NOTE — Progress Notes (Signed)
Pt set up on overnight pulse ox study.  RT will retrieve machine in the morning and download results.

## 2022-02-08 NOTE — TOC Progression Note (Signed)
Transition of Care [facility]) - Progression Note    Patient Details  Name: Fartun Camerer MRN: [id number] Date of Birth: 10-Jun-1971  [facility]) CM/SW Contact  Zenon Mayo, RN Phone Number: 02/08/2022, 4:43 PM  Clinical Narrative:     from home, diverticulits secondary to UTI, hx of CHF, indep, on clears, IV abx, once advance diet and able to tolerater will dc home.  On oxygen at night 3 liters.  TOC will continue to follow for dc needs.         Expected Discharge Plan and Services                                                 Social Determinants of Health (SDOH) Interventions    Readmission Risk Interventions     View : No data to display.

## 2022-02-08 NOTE — Progress Notes (Signed)
PROGRESS NOTE    Kaylee [name]  [id number] DOB: 1971/01/04 DOA: 02/06/2022 PCP: Storm Frisk, MD    Brief Narrative:  51 year old female with past medical history of hyperlipidemia, diabetes mellitus type 2, hypertension, diastolic congestive heart failure, morbid obesity, iron deficiency anemia presented to the hospital with lower abdominal pain for 1 week with worsening symptoms for couple of days prior to presentation..  Patient also had weakness, fatigue and intermittent headache with poor oral intake.  Patient had initially gone to local urgent care clinic on 01/27/22 and at that time was diagnosed with a urinary tract infection.  Patient given a dose of intramuscular ceftriaxone  and was sent home with a course of cefdinir over the next 10 days.  Patient had mild improvement in her symptoms initially but then started having worsening lower abdominal pain and intermittent fevers and weakness.  In the ED, patient was febrile tachycardic with significant leukocytosis.  Patient underwent CT scan of the abdomen pelvis which showed acute diverticulitis.  Patient was was given cefepime IV and then was considered for admission to the hospital.  General surgery was also consulted during hospitalization due to concerns for involvement of the left adnexa bladder and ureter area.  After admission, pain has slightly improved.  Patient is currently undergoing IV antibiotics, on clears.  General surgery following.    Assessment and Plan. Principal Problem:   Diverticulitis large intestine w/o perforation or abscess w/o bleeding Active Problems:   Sepsis (HCC)   Lactic acidosis   Hypercalcemia   Type 2 diabetes mellitus with hyperglycemia, without long-term current use of insulin (HCC)   Chronic systolic CHF (congestive heart failure) (HCC)   Essential hypertension   Mixed diabetic hyperlipidemia associated with type 2 diabetes mellitus (HCC)   Obesity, morbid, BMI 50 or higher (HCC)    Hyponatremia   Acute diverticulitis large intestine w/o perforation or abscess w/o bleeding Presented with several days of abdominal pain prior to presentation.  CT scan consistent with acute diverticulitis complicated by involvement of the left adnexa, course of the left ureter and bladder dome.  General surgery on board and at this time no surgical intervention planned.  Continue IV antibiotic with Zosyn, continue IV fluids, on clears at this time.  Has been tolerating.  Further plan as per general surgery.  Sepsis secondary to diverticulitis, UTI Patient presented with fever of 101.3 F, tachycardia, elevated lactate, tachypnea on presentation with significant leukocytosis.  Urinalysis positive for many bacteria and more than 50 white cells with negative nitrate.  Urine culture with multiple species likely contamination.  CT scan with features of acute diverticulitis.  Blood cultures from 02/06/2022 negative in 2 days.  Continue IV antibiotics with IV Zosyn at this time leukocytosis has trended down to 15.3 from 17 K but he still has a fever.  Temperature max of 100.4 F.  Acute lactic acidosis Secondary to sepsis with volume depletion.  On Ringer lactate 100 ml/h at this time.  We will discontinue IV fluids.  Initially received septic protocol.  Hypercalcemia Review of previous records showing mild hypercalcemia.  Vitamin D level at 13.5.  PTH normal at 39.  Pending  PTH related peptide, continue hydration.  Vitamin D deficiency.  Will start on 50,000 units weekly.   Type 2 diabetes mellitus with hyperglycemia, without long-term current use of insulin (HCC) Hemoglobin A1c at 7.1.  Continue sliding scale insulin for now.  On Farxiga and glipizide at home   Chronic systolic CHF (congestive heart failure) (HCC)  Currently on clears.Marland Kitchen. on IV fluids.  Compensated so far. On IV Lasix 20 mg daily at home.  We will discontinue IV fluids.   Essential hypertension Continue losartan metoprolol at  home.  Mixed diabetic hyperlipidemia associated with type 2 diabetes mellitus (HCC) Continue Lipitor  Obesity, morbid, BMI 50 or higher (HCC) Would benefit from weight loss as outpatient   Hyponatremia mild.  Latest sodium of 132.  Continue to monitor.     DVT prophylaxis:  lovenox subq   Code Status:     Code Status: Full Code  Disposition: Home likely in 1 to 2 days  Status is: Inpatient  Remains inpatient appropriate because: IV antibiotics, surgical follow-up,    Family Communication:  Communicated with the patient at bedside  Consultants:  General surgery  Procedures:  None  Antimicrobials:  Zosyn 6/4>   Subjective:  Today, patient was seen and examined at bedside.  States that that she had to take an pain pill this morning but otherwise feels okay.  Denies any nausea vomiting.  Has not had a bowel movement.  No fever or chills.  Passing gas.  No urinary issues.   Objective: Vitals:   02/07/22 2300 02/07/22 2333 02/08/22 0430 02/08/22 0713  BP:  108/69 107/70 106/75  Pulse: (!) 102 94 98 (!) 106  Resp:  18 17 17   Temp:  98.8 F (37.1 C) 99.3 F (37.4 C) 98.8 F (37.1 C)  TempSrc:  Oral Oral Oral  SpO2: (!) 86% 97% 100% 90%  Weight:      Height:        Intake/Output Summary (Last 24 hours) at 02/08/2022 0948 Last data filed at 02/08/2022 0855 Gross per 24 hour  Intake 1130 ml  Output 1100 ml  Net 30 ml   Filed Weights   02/06/22 2007 02/07/22 0346  Weight: (!) 196.3 kg (!) 196.8 kg    Physical Examination: Body mass index is 67.96 kg/m.   General: Morbidly obese built, not in obvious distress HENT:   No scleral pallor or icterus noted. Oral mucosa is moist.  Chest:  Clear breath sounds.  Diminished breath sounds bilaterally. No crackles or wheezes.  CVS: S1 &S2 heard. No murmur.  Regular rate and rhythm. Abdomen: Soft, left lower quadrant tenderness on deep palpation, obese abdomen  Extremities: No cyanosis, clubbing or edema.  Peripheral  pulses are palpable. Psych: Alert, awake and oriented, normal mood CNS:  No cranial nerve deficits.  Power equal in all extremities.   Skin: Warm and dry.  No rashes noted.   Data Reviewed:   CBC: Recent Labs  Lab 02/06/22 0146 02/07/22 0642 02/08/22 0436  WBC 19.1* 17.0* 15.3*  NEUTROABS 11.2* 12.2*  --   HGB 13.2 11.0* 11.4*  HCT 43.0 36.0 37.8  MCV 87.9 87.0 87.9  PLT 443* 365 359    Basic Metabolic Panel: Recent Labs  Lab 02/06/22 0146 02/07/22 0642 02/08/22 0436  NA 133* 131* 132*  K 4.0 3.8 3.7  CL 100 104 100  CO2 22 23 24   GLUCOSE 153* 126* 119*  BUN <5* 8 10  CREATININE 0.74 0.89 0.83  CALCIUM 10.4* 9.7 9.7  MG  --  1.8 1.9    Liver Function Tests: Recent Labs  Lab 02/06/22 0146 02/07/22 0642  AST 16 13*  ALT 13 11  ALKPHOS 102 82  BILITOT 0.6 0.8  PROT 9.5* 8.2*  ALBUMIN 3.0* 2.6*     Radiology Studies: No results found.    LOS: 2  days    Joycelyn Das, MD [facility] Available via Epic secure chat 7am-7pm After these hours, please refer to coverage provider listed on [email or web address] 02/08/2022, 9:48 AM

## 2022-02-08 NOTE — Plan of Care (Signed)
  Problem: Coping: Goal: Ability to adjust to condition or change in health will improve Outcome: Progressing   Problem: Fluid Volume: Goal: Ability to maintain a balanced intake and output will improve Outcome: Progressing   Problem: Health Behavior/Discharge Planning: Goal: Ability to manage health-related needs will improve Outcome: Progressing   Problem: Metabolic: Goal: Ability to maintain appropriate glucose levels will improve Outcome: Progressing   Problem: Skin Integrity: Goal: Risk for impaired skin integrity will decrease Outcome: Progressing   

## 2022-02-09 DIAGNOSIS — E871 Hypo-osmolality and hyponatremia: Secondary | ICD-10-CM

## 2022-02-09 LAB — GLUCOSE, CAPILLARY
Glucose-Capillary: 129 mg/dL — ABNORMAL HIGH (ref 70–99)
Glucose-Capillary: 146 mg/dL — ABNORMAL HIGH (ref 70–99)
Glucose-Capillary: 108 mg/dL — ABNORMAL HIGH (ref 70–99)
Glucose-Capillary: 155 mg/dL — ABNORMAL HIGH (ref 70–99)

## 2022-02-09 LAB — CBC
HCT: 35.3 % — ABNORMAL LOW (ref 36.0–46.0)
Hemoglobin: 10.9 g/dL — ABNORMAL LOW (ref 12.0–15.0)
MCH: 26.9 pg (ref 26.0–34.0)
MCHC: 30.9 g/dL (ref 30.0–36.0)
MCV: 87.2 fL (ref 80.0–100.0)
Platelets: 341 10*3/uL (ref 150–400)
RBC: 4.05 MIL/uL (ref 3.87–5.11)
RDW: 14.6 % (ref 11.5–15.5)
WBC: 13.6 10*3/uL — ABNORMAL HIGH (ref 4.0–10.5)
nRBC: 0 % (ref 0.0–0.2)

## 2022-02-09 LAB — BASIC METABOLIC PANEL
Anion gap: 8 (ref 5–15)
BUN: 10 mg/dL (ref 6–20)
CO2: 24 mmol/L (ref 22–32)
Calcium: 9.7 mg/dL (ref 8.9–10.3)
Chloride: 99 mmol/L (ref 98–111)
Creatinine, Ser: 0.68 mg/dL (ref 0.44–1.00)
GFR, Estimated: >60 mL/min (ref >60)
Glucose, Bld: 118 mg/dL — ABNORMAL HIGH (ref 70–99)
Potassium: 3.5 mmol/L (ref 3.5–5.1)
Sodium: 131 mmol/L — ABNORMAL LOW (ref 135–145)

## 2022-02-09 LAB — MAGNESIUM: Magnesium: 1.8 mg/dL (ref 1.7–2.4)

## 2022-02-09 MED ORDER — METRONIDAZOLE 500 MG PO TABS
500.0000 mg | ORAL_TABLET | Freq: Two times a day (BID) | ORAL | Status: DC
  Administered 2022-02-09 – 2022-02-11 (×5): 500 mg via ORAL
  Filled 2022-02-09 (×5): qty 1

## 2022-02-09 MED ORDER — CIPROFLOXACIN HCL 750 MG PO TABS
750.0000 mg | ORAL_TABLET | Freq: Two times a day (BID) | ORAL | Status: DC
  Administered 2022-02-09 – 2022-02-11 (×5): 750 mg via ORAL
  Filled 2022-02-09 (×7): qty 1

## 2022-02-09 NOTE — Plan of Care (Signed)
  Problem: Coping: Goal: Ability to adjust to condition or change in health will improve Outcome: Progressing   Problem: Fluid Volume: Goal: Ability to maintain a balanced intake and output will improve Outcome: Progressing   Problem: Health Behavior/Discharge Planning: Goal: Ability to manage health-related needs will improve Outcome: Progressing   Problem: Nutritional: Goal: Maintenance of adequate nutrition will improve Outcome: Progressing   Problem: Skin Integrity: Goal: Risk for impaired skin integrity will decrease Outcome: Progressing   Problem: Tissue Perfusion: Goal: Adequacy of tissue perfusion will improve Outcome: Progressing

## 2022-02-09 NOTE — Progress Notes (Signed)
Overnight pulse ox study completed and downloaded.  A copy of the study can be found in the patients chart.

## 2022-02-09 NOTE — Progress Notes (Addendum)
Subjective: Feeling better each day. Today denies any abdominal/pelvic pain. Feels all better and is asking when she may go home.  Denies pneumaturia or stool in urine.  No nausea or vomiting. Having bowel fxn as well. Tolerating full liquids without any nausea/bloating/pain.  ROS: See above, otherwise other systems negative  Objective: Vital signs in last 24 hours: Temp:  [98 F (36.7 C)-100.1 F (37.8 C)] 99.2 F (37.3 C) (06/07 0800) Pulse Rate:  [96-116] 116 (06/07 0800) Resp:  [17-18] 18 (06/07 0800) BP: (97-120)/(58-88) 116/65 (06/07 0800) SpO2:  [89 %-97 %] 92 % (06/07 0742) Weight:  [192.8 kg] 192.8 kg (06/07 0547) Last BM Date : 02/03/22  Intake/Output from previous day: 06/06 0701 - 06/07 0700 In: 725.3 [P.O.:480; IV Piggyback:245.3] Out: 700 [Urine:700] Intake/Output this shift: Total I/O In: -  Out: 300 [Urine:300]  PE: Heart: regular Lungs: CTAB Abd: soft, mildly tender in suprapubic region, morbidly obese, +BS, no peritonitis   Lab Results:  Recent Labs    02/08/22 0436 02/09/22 0356  WBC 15.3* 13.6*  HGB 11.4* 10.9*  HCT 37.8 35.3*  PLT 359 341   BMET Recent Labs    02/08/22 0436 02/09/22 0356  NA 132* 131*  K 3.7 3.5  CL 100 99  CO2 24 24  GLUCOSE 119* 118*  BUN 10 10  CREATININE 0.83 0.68  CALCIUM 9.7 9.7   PT/INR No results for input(s): LABPROT, INR in the last 72 hours.  CMP     Component Value Date/Time   NA 131 (L) 02/09/2022 0356   NA 137 06/01/2021 1125   K 3.5 02/09/2022 0356   CL 99 02/09/2022 0356   CO2 24 02/09/2022 0356   GLUCOSE 118 (H) 02/09/2022 0356   BUN 10 02/09/2022 0356   BUN 4 (L) 06/01/2021 1125   CREATININE 0.68 02/09/2022 0356   CALCIUM 9.7 02/09/2022 0356   PROT 8.2 (H) 02/07/2022 0642   PROT 7.9 06/01/2021 1125   ALBUMIN 2.6 (L) 02/07/2022 0642   ALBUMIN 3.7 (L) 06/01/2021 1125   AST 13 (L) 02/07/2022 0642   ALT 11 02/07/2022 0642   ALKPHOS 82 02/07/2022 0642   BILITOT 0.8 02/07/2022  0642   BILITOT <0.2 06/01/2021 1125   GFRNONAA >60 02/09/2022 0356   GFRAA 131 07/22/2020 0907   Lipase  No results found for: LIPASE     Studies/Results: No results found.  Anti-infectives: Anti-infectives (From admission, onward)    Start     Dose/Rate Route Frequency Ordered Stop   02/09/22 1030  ciprofloxacin (CIPRO) tablet 750 mg        750 mg Oral 2 times daily 02/09/22 0946     02/09/22 1030  metroNIDAZOLE (FLAGYL) tablet 500 mg        500 mg Oral Every 12 hours 02/09/22 0946     02/06/22 0800  piperacillin-tazobactam (ZOSYN) IVPB 3.375 g  Status:  Discontinued        3.375 g 12.5 mL/hr over 240 Minutes Intravenous Every 8 hours 02/06/22 0546 02/09/22 0946   02/06/22 0330  cefTRIAXone (ROCEPHIN) 2 g in sodium chloride 0.9 % 100 mL IVPB  Status:  Discontinued        2 g 200 mL/hr over 30 Minutes Intravenous Every 24 hours 02/06/22 0324 02/06/22 0327   02/06/22 0330  ceFEPIme (MAXIPIME) 2 g in sodium chloride 0.9 % 100 mL IVPB        2 g 200 mL/hr over 30 Minutes Intravenous  Once 02/06/22 0327 02/06/22 0409        Assessment/Plan Acute sigmoid diverticulitis with possible involvement of L adnexa and L ureter/bladder dome  - Afebrile, WBC down-trending; pain free, symptoms clinically appear to be resolved - Carb modified as tolerated today; will change to PO abx as well.   FEN: Diabetic diet VTE: LMWH 100 mg daily  ID: Zosyn 6/4>>   - below per TRH -  HTN T2DM Chronic systolic CHF Lymphedema Morbid obesity  I reviewed hospitalist notes, last 24 h vitals and pain scores, last 48 h intake and output, last 24 h labs and trends, and last 24 h imaging results.  I spent a total of 35 minutes in both face-to-face and non-face-to-face activities, excluding procedures performed, for this visit on the date of this encounter.   LOS: 3 days   Marin Olp, MD [facility], [facility]

## 2022-02-09 NOTE — Progress Notes (Signed)
PROGRESS NOTE    Kaylee [name]  XQJ:[id number] DOB: 12/19/1970 DOA: 02/06/2022 PCP: Storm Frisk, MD   Brief Narrative: 51 year old female with past medical history of hyperlipidemia, diabetes mellitus type 2, hypertension, diastolic congestive heart failure, morbid obesity, iron deficiency anemia presented to the hospital with lower abdominal pain for 1 week with worsening symptoms for couple of days prior to presentation..  Patient also had weakness, fatigue and intermittent headache with poor oral intake.  Patient had initially gone to local urgent care clinic on 01/27/22 and at that time was diagnosed with a urinary tract infection.  Patient given a dose of intramuscular ceftriaxone  and was sent home with a course of cefdinir over the next 10 days.  Patient had mild improvement in her symptoms initially but then started having worsening lower abdominal pain and intermittent fevers and weakness.  In the ED, patient was febrile tachycardic with significant leukocytosis.  Patient underwent CT scan of the abdomen pelvis which showed acute diverticulitis.  Patient was was given cefepime IV and then was considered for admission to the hospital.  General surgery was also consulted during hospitalization due to concerns for involvement of the left adnexa bladder and ureter area.  After admission, pain has slightly improved.  Patient is currently undergoing IV antibiotics, on clears.  General surgery following and patient has been transitioned to oral antibiotics today.    Assessment and Plan. Principal Problem:   Diverticulitis large intestine w/o perforation or abscess w/o bleeding Active Problems:   Sepsis (HCC)   Lactic acidosis   Hypercalcemia   Type 2 diabetes mellitus with hyperglycemia, without long-term current use of insulin (HCC)   Chronic systolic CHF (congestive heart failure) (HCC)   Essential hypertension   Mixed diabetic hyperlipidemia associated with type 2 diabetes  mellitus (HCC)   Obesity, morbid, BMI 50 or higher (HCC)   Hyponatremia   Acute diverticulitis large intestine w/o perforation or abscess w/o bleeding CT scan consistent with acute diverticulitis with involvement of left adnexa, left ureter and bladder dome. General surgery consulted and provided recommendations for management. Patient started empirically on Zosyn IV and provided IV fluids. Pain improving. Fever curve is trending down with leukocytosis also improvement. Antibiotics transitioned to oral this morning by general surgery -Continue Ciprofloxacin and Flagyl per general surgery  Sepsis secondary to diverticulitis, UTI Present on admission with fever, tachycardia and elevated lactate. Secondary to acute diverticulitis. Blood cultures without growth to date. Leukocytosis improving with above treatment plan.  Acute lactic acidosis Secondary to sepsis. Patient managed with IV fluid resuscitation.  Hypercalcemia Review of previous records showing mild hypercalcemia.  Vitamin D level at 13.5.  PTH normal at 39. Hypercalcemia resolved. PTH related peptide pending.  Vitamin D deficiency.   25-hydroxy Vitamin D of 13.55. started on Vitamin D supplementation while admitted. -Continue vitamin D supplementation   Type 2 diabetes mellitus with hyperglycemia, without long-term current use of insulin (HCC) Hemoglobin A1C of 7.1%. patietn is on Farxiga and glipizide as an outpatient. Started on SSI while inpatient -Continue SSI   Chronic systolic CHF (congestive heart failure) (HCC) Stable. Patient is on Losartan, metoprolol and Lasix as an outpatient. Patient initially required fluid resuscitation which has been discontinued. -Continue losartan and metoprolol    Essential hypertension Continue home losartan and metoprolol  Mixed diabetic hyperlipidemia associated with type 2 diabetes mellitus (HCC) Continue Lipitor  Obesity, morbid, BMI 50 or higher (HCC) Body mass index is 66.56  kg/m. Patient will need outpatient follow-up with PCP  for discussion/plan for weight loss.  Hyponatremia Mild. Stable.    DVT prophylaxis: Lovenox Code Status:   Code Status: Full Code Family Communication: None at bedside Disposition Plan: Discharge home in AM if afebrile overnight/x24 hours in addition to tolerating soft diet.   Consultants:  General surgery  Procedures:  None  Antimicrobials: Zosyn Ciprofloxacin Flagyl    Subjective: Patient reports improvement in abdominal pain. Ate some breakfast and did okay this morning.  Objective: BP (!) 99/58 (BP Location: Left Wrist)   Pulse (!) 101   Temp 98.5 F (36.9 C) (Oral)   Resp 17   Ht 5\' 7"  (1.702 m)   Wt (!) 192.8 kg Comment: scale b  SpO2 95%   BMI 66.56 kg/m   Examination:  General exam: Appears calm and comfortable. Morbidly obese. Respiratory system: Clear to auscultation. Respiratory effort normal. Cardiovascular system: S1 & S2 heard, RRR. No murmurs, rubs, gallops or clicks. Gastrointestinal system: Abdomen is nondistended, soft and nontender. Normal bowel sounds heard. Central nervous system: Alert and oriented. No focal neurological deficits. Musculoskeletal: No edema. No calf tenderness Skin: No cyanosis. No rashes Psychiatry: Judgement and insight appear normal. Mood & affect appropriate.    Data Reviewed: I have personally reviewed following labs and imaging studies  CBC Lab Results  Component Value Date   WBC 13.6 (H) 02/09/2022   RBC 4.05 02/09/2022   HGB 10.9 (L) 02/09/2022   HCT 35.3 (L) 02/09/2022   MCV 87.2 02/09/2022   MCH 26.9 02/09/2022   PLT 341 02/09/2022   MCHC 30.9 02/09/2022   RDW 14.6 02/09/2022   LYMPHSABS 3.4 02/07/2022   MONOABS 1.3 (H) 02/07/2022   EOSABS 0.1 02/07/2022   BASOSABS 0.1 02/07/2022     Last metabolic panel Lab Results  Component Value Date   NA 131 (L) 02/09/2022   K 3.5 02/09/2022   CL 99 02/09/2022   CO2 24 02/09/2022   BUN 10  02/09/2022   CREATININE 0.68 02/09/2022   GLUCOSE 118 (H) 02/09/2022   GFRNONAA >60 02/09/2022   GFRAA 131 07/22/2020   CALCIUM 9.7 02/09/2022   PROT 8.2 (H) 02/07/2022   ALBUMIN 2.6 (L) 02/07/2022   LABGLOB 4.2 06/01/2021   AGRATIO 0.9 (L) 06/01/2021   BILITOT 0.8 02/07/2022   ALKPHOS 82 02/07/2022   AST 13 (L) 02/07/2022   ALT 11 02/07/2022   ANIONGAP 8 02/09/2022    GFR: Estimated Creatinine Clearance: 149.9 mL/min (by C-G formula based on SCr of 0.68 mg/dL).  Recent Results (from the past 240 hour(s))  Urine Culture     Status: Abnormal   Collection Time: 02/06/22  1:53 AM   Specimen: In/Out Cath Urine  Result Value Ref Range Status   Specimen Description IN/OUT CATH URINE  Final   Special Requests   Final    NONE Performed at [facility], [address] [place], [place] [id number]    Culture MULTIPLE SPECIES PRESENT, SUGGEST RECOLLECTION (A)  Final   Report Status 02/07/2022 FINAL  Final  Blood culture (routine x 2)     Status: None (Preliminary result)   Collection Time: 02/06/22  3:20 AM   Specimen: BLOOD  Result Value Ref Range Status   Specimen Description BLOOD LEFT ANTECUBITAL  Final   Special Requests   Final    BOTTLES DRAWN AEROBIC AND ANAEROBIC Blood Culture adequate volume   Culture   Final    NO GROWTH 3 DAYS Performed at [facility], [address]  [place], [place] [address]    Report Status PENDING  Incomplete  Resp Panel by RT-PCR (Flu A&B, Covid) Anterior Nasal Swab     Status: None   Collection Time: 02/06/22  3:23 AM   Specimen: Anterior Nasal Swab  Result Value Ref Range Status   SARS Coronavirus 2 by RT PCR NEGATIVE NEGATIVE Final    Comment: (NOTE) SARS-CoV-2 target nucleic acids are NOT DETECTED.  The SARS-CoV-2 RNA is generally detectable in upper respiratory specimens during the acute phase of infection. The lowest concentration of SARS-CoV-2 viral copies this assay can detect is 138 copies/mL. A negative result does  not preclude SARS-Cov-2 infection and should not be used as the sole basis for treatment or other patient management decisions. A negative result may occur with  improper specimen collection/handling, submission of specimen other than nasopharyngeal swab, presence of viral mutation(s) within the areas targeted by this assay, and inadequate number of viral copies(<138 copies/mL). A negative result must be combined with clinical observations, patient history, and epidemiological information. The expected result is Negative.  Fact Sheet for Patients:  BloggerCourse.com  Fact Sheet for Healthcare Providers:  SeriousBroker.it  This test is no t yet approved or cleared by the [place] FDA and  has been authorized for detection and/or diagnosis of SARS-CoV-2 by FDA under an Emergency Use Authorization (EUA). This EUA will remain  in effect (meaning this test can be used) for the duration of the COVID-19 declaration under Section 564(b)(1) of the Act, 21 U.S.C.section 360bbb-3(b)(1), unless the authorization is terminated  or revoked sooner.       Influenza A by PCR NEGATIVE NEGATIVE Final   Influenza B by PCR NEGATIVE NEGATIVE Final    Comment: (NOTE) The Xpert Xpress SARS-CoV-2/FLU/RSV plus assay is intended as an aid in the diagnosis of influenza from Nasopharyngeal swab specimens and should not be used as a sole basis for treatment. Nasal washings and aspirates are unacceptable for Xpert Xpress SARS-CoV-2/FLU/RSV testing.  Fact Sheet for Patients: BloggerCourse.com  Fact Sheet for Healthcare Providers: SeriousBroker.it  This test is not yet approved or cleared by the [place] FDA and has been authorized for detection and/or diagnosis of SARS-CoV-2 by FDA under an Emergency Use Authorization (EUA). This EUA will remain in effect (meaning this test can be used) for the  duration of the COVID-19 declaration under Section 564(b)(1) of the Act, 21 U.S.C. section 360bbb-3(b)(1), unless the authorization is terminated or revoked.  Performed at [facility], [address], [place], [place] [address]   Blood culture (routine x 2)     Status: None (Preliminary result)   Collection Time: 02/06/22  3:25 AM   Specimen: BLOOD RIGHT ARM  Result Value Ref Range Status   Specimen Description BLOOD RIGHT ARM  Final   Special Requests   Final    BOTTLES DRAWN AEROBIC AND ANAEROBIC Blood Culture adequate volume   Culture   Final    NO GROWTH 3 DAYS Performed at [facility], [address], [place], [place] [address]    Report Status PENDING  Incomplete      Radiology Studies: No results found.    LOS: 3 days    Jacquelin Hawking, MD [facility] 02/09/2022, 4:28 PM   If 7PM-7AM, please contact night-coverage [email or web address]

## 2022-02-10 LAB — CBC
HCT: 33.6 % — ABNORMAL LOW (ref 36.0–46.0)
Hemoglobin: 10.3 g/dL — ABNORMAL LOW (ref 12.0–15.0)
MCH: 26.7 pg (ref 26.0–34.0)
MCHC: 30.7 g/dL (ref 30.0–36.0)
MCV: 87 fL (ref 80.0–100.0)
Platelets: 382 10*3/uL (ref 150–400)
RBC: 3.86 MIL/uL — ABNORMAL LOW (ref 3.87–5.11)
RDW: 14.7 % (ref 11.5–15.5)
WBC: 15.2 10*3/uL — ABNORMAL HIGH (ref 4.0–10.5)
nRBC: 0 % (ref 0.0–0.2)

## 2022-02-10 LAB — GLUCOSE, CAPILLARY
Glucose-Capillary: 140 mg/dL — ABNORMAL HIGH (ref 70–99)
Glucose-Capillary: 131 mg/dL — ABNORMAL HIGH (ref 70–99)
Glucose-Capillary: 119 mg/dL — ABNORMAL HIGH (ref 70–99)
Glucose-Capillary: 123 mg/dL — ABNORMAL HIGH (ref 70–99)
Glucose-Capillary: 110 mg/dL — ABNORMAL HIGH (ref 70–99)
Glucose-Capillary: 149 mg/dL — ABNORMAL HIGH (ref 70–99)

## 2022-02-10 MED ORDER — HYDROCERIN EX CREA
TOPICAL_CREAM | Freq: Two times a day (BID) | CUTANEOUS | Status: DC
  Administered 2022-02-10: 1 via TOPICAL
  Filled 2022-02-10 (×2): qty 113

## 2022-02-10 NOTE — Progress Notes (Signed)
Subjective: Currently sitting up on bed. Denies abdominal pain at present; occasional twingy pelvic cramps. No n/v. Having flatus and Bms. Tolerating diet without trouble.  Asking when she may go home.   ROS: See above, otherwise other systems negative  Objective: Vital signs in last 24 hours: Temp:  [98.5 F (36.9 C)-98.8 F (37.1 C)] 98.8 F (37.1 C) (06/08 0841) Pulse Rate:  [99-111] 102 (06/08 0841) Resp:  [17-20] 20 (06/08 0841) BP: (71-112)/(51-80) 110/80 (06/08 0841) SpO2:  [91 %-95 %] 95 % (06/08 0841) Weight:  [195.9 kg] 195.9 kg (06/08 0354) Last BM Date : 02/09/22  Intake/Output from previous day: 06/07 0701 - 06/08 0700 In: 600 [P.O.:600] Out: 300 [Urine:300] Intake/Output this shift: No intake/output data recorded.  PE: Heart: regular Lungs: CTAB Abd: soft, mildly tender in suprapubic region, morbidly obese, +BS, no peritonitis   Lab Results:  Recent Labs    02/09/22 0356 02/10/22 0310  WBC 13.6* 15.2*  HGB 10.9* 10.3*  HCT 35.3* 33.6*  PLT 341 382   BMET Recent Labs    02/08/22 0436 02/09/22 0356  NA 132* 131*  K 3.7 3.5  CL 100 99  CO2 24 24  GLUCOSE 119* 118*  BUN 10 10  CREATININE 0.83 0.68  CALCIUM 9.7 9.7   PT/INR No results for input(s): "LABPROT", "INR" in the last 72 hours.  CMP     Component Value Date/Time   NA 131 (L) 02/09/2022 0356   NA 137 06/01/2021 1125   K 3.5 02/09/2022 0356   CL 99 02/09/2022 0356   CO2 24 02/09/2022 0356   GLUCOSE 118 (H) 02/09/2022 0356   BUN 10 02/09/2022 0356   BUN 4 (L) 06/01/2021 1125   CREATININE 0.68 02/09/2022 0356   CALCIUM 9.7 02/09/2022 0356   PROT 8.2 (H) 02/07/2022 0642   PROT 7.9 06/01/2021 1125   ALBUMIN 2.6 (L) 02/07/2022 0642   ALBUMIN 3.7 (L) 06/01/2021 1125   AST 13 (L) 02/07/2022 0642   ALT 11 02/07/2022 0642   ALKPHOS 82 02/07/2022 0642   BILITOT 0.8 02/07/2022 0642   BILITOT <0.2 06/01/2021 1125   GFRNONAA >60 02/09/2022 0356   GFRAA 131 07/22/2020 0907    Lipase  No results found for: "LIPASE"     Studies/Results: No results found.  Anti-infectives: Anti-infectives (From admission, onward)    Start     Dose/Rate Route Frequency Ordered Stop   02/09/22 1045  ciprofloxacin (CIPRO) tablet 750 mg        750 mg Oral 2 times daily 02/09/22 0946     02/09/22 1045  metroNIDAZOLE (FLAGYL) tablet 500 mg        500 mg Oral Every 12 hours 02/09/22 0946     02/06/22 0800  piperacillin-tazobactam (ZOSYN) IVPB 3.375 g  Status:  Discontinued        3.375 g 12.5 mL/hr over 240 Minutes Intravenous Every 8 hours 02/06/22 0546 02/09/22 0946   02/06/22 0330  cefTRIAXone (ROCEPHIN) 2 g in sodium chloride 0.9 % 100 mL IVPB  Status:  Discontinued        2 g 200 mL/hr over 30 Minutes Intravenous Every 24 hours 02/06/22 0324 02/06/22 0327   02/06/22 0330  ceFEPIme (MAXIPIME) 2 g in sodium chloride 0.9 % 100 mL IVPB        2 g 200 mL/hr over 30 Minutes Intravenous  Once 02/06/22 0327 02/06/22 0409        Assessment/Plan Acute sigmoid diverticulitis with  possible involvement of L adnexa and L ureter/bladder dome  - Afebrile, WBC 15 from 13; pain free, symptoms clinically appear to continue to be resolved - Carb modified diet; PO abx   FEN: Diabetic diet VTE: LMWH 100 mg daily  ID: Zosyn 6/4>>6/7; cipro/flagyl 6/7>> Would monitor today; if continues to do well may be stable for discharge tomorrow   - below per TRH -  HTN T2DM Chronic systolic CHF Lymphedema Morbid obesity  I reviewed hospitalist notes, last 24 h vitals and pain scores, last 48 h intake and output, last 24 h labs and trends, and last 24 h imaging results.  I spent a total of 37 minutes in both face-to-face and non-face-to-face activities, excluding procedures performed, for this visit on the date of this encounter.   LOS: 4 days   Marin Olp, MD [facility], [facility]

## 2022-02-10 NOTE — Plan of Care (Signed)

## 2022-02-10 NOTE — Progress Notes (Signed)
PROGRESS NOTE    Kaylee [name]  UXL:[id number] DOB: September 02, 1971 DOA: 02/06/2022 PCP: Storm Frisk, MD   Brief Narrative: 51 year old female with past medical history of hyperlipidemia, diabetes mellitus type 2, hypertension, diastolic congestive heart failure, morbid obesity, iron deficiency anemia presented to the hospital with lower abdominal pain for 1 week with worsening symptoms for couple of days prior to presentation..  Patient also had weakness, fatigue and intermittent headache with poor oral intake.  Patient had initially gone to local urgent care clinic on 01/27/22 and at that time was diagnosed with a urinary tract infection.  Patient given a dose of intramuscular ceftriaxone  and was sent home with a course of cefdinir over the next 10 days.  Patient had mild improvement in her symptoms initially but then started having worsening lower abdominal pain and intermittent fevers and weakness.  In the ED, patient was febrile tachycardic with significant leukocytosis.  Patient underwent CT scan of the abdomen pelvis which showed acute diverticulitis.  Patient was was given cefepime IV and then was considered for admission to the hospital.  General surgery was also consulted during hospitalization due to concerns for involvement of the left adnexa bladder and ureter area.  After admission, pain has slightly improved.  Patient is currently undergoing IV antibiotics, on clears.  General surgery following and patient has been transitioned to oral antibiotics today.    Assessment and Plan. Principal Problem:   Diverticulitis large intestine w/o perforation or abscess w/o bleeding Active Problems:   Sepsis (HCC)   Lactic acidosis   Hypercalcemia   Type 2 diabetes mellitus with hyperglycemia, without long-term current use of insulin (HCC)   Chronic systolic CHF (congestive heart failure) (HCC)   Essential hypertension   Mixed diabetic hyperlipidemia associated with type 2 diabetes  mellitus (HCC)   Obesity, morbid, BMI 50 or higher (HCC)   Hyponatremia   Acute diverticulitis large intestine w/o perforation or abscess w/o bleeding CT scan consistent with acute diverticulitis with involvement of left adnexa, left ureter and bladder dome. General surgery consulted and provided recommendations for management. Patient started empirically on Zosyn IV and provided IV fluids. Pain improving. Fever curve is trending down with leukocytosis improved overall; slight increase in WBC today. Antibiotics transitioned to oral on 6/7 by general surgery. Afebrile overnight. -Continue Ciprofloxacin and Flagyl per general surgery -General surgery recommendations: possible discharge in AM  Sepsis secondary to diverticulitis, UTI Present on admission with fever, tachycardia and elevated lactate. Secondary to acute diverticulitis. Blood cultures without growth to date. Leukocytosis improving with above treatment plan.  Acute lactic acidosis Secondary to sepsis. Patient managed with IV fluid resuscitation.  Hypercalcemia Review of previous records showing mild hypercalcemia.  Vitamin D level at 13.5.  PTH normal at 39. Hypercalcemia resolved. PTH related peptide pending.  Vitamin D deficiency.   25-hydroxy Vitamin D of 13.55. started on Vitamin D supplementation while admitted. -Continue vitamin D supplementation   Type 2 diabetes mellitus with hyperglycemia, without long-term current use of insulin (HCC) Hemoglobin A1C of 7.1%. patietn is on Farxiga and glipizide as an outpatient. Started on SSI while inpatient -Continue SSI   Chronic systolic CHF (congestive heart failure) (HCC) Stable. Patient is on Losartan, metoprolol and Lasix as an outpatient. Patient initially required fluid resuscitation which has been discontinued. -Continue losartan and metoprolol    Essential hypertension Continue home losartan and metoprolol  Mixed diabetic hyperlipidemia associated with type 2 diabetes  mellitus (HCC) Continue Lipitor  Obesity, morbid, BMI 50 or higher (  HCC) Body mass index is 67.63 kg/m. Patient will need outpatient follow-up with PCP for discussion/plan for weight loss.  Hyponatremia Mild. Stable.    DVT prophylaxis: Lovenox Code Status:   Code Status: Full Code Family Communication: None at bedside Disposition Plan: Discharge home in AM pending general surgery recommendations   Consultants:  General surgery  Procedures:  None  Antimicrobials: Zosyn Ciprofloxacin Flagyl    Subjective: No abdominal pain per patient. Tolerating diet.  Objective: BP 91/65 (BP Location: Left Arm)   Pulse 88   Temp 99.7 F (37.6 C) (Oral)   Resp 20   Ht 5\' 7"  (1.702 m)   Wt (!) 195.9 kg   SpO2 99%   BMI 67.63 kg/m   Examination:  General exam: Appears calm and comfortable Respiratory system: Clear to auscultation. Respiratory effort normal. Cardiovascular system: S1 & S2 heard, RRR. Gastrointestinal system: Abdomen is nondistended, soft and nontender. Normal bowel sounds heard. Central nervous system: Alert and oriented. No focal neurological deficits. Musculoskeletal: No edema. No calf tenderness Skin: No cyanosis. Dry skin, especially of legs.  Psychiatry: Judgement and insight appear normal. Mood & affect appropriate.    Data Reviewed: I have personally reviewed following labs and imaging studies  CBC Lab Results  Component Value Date   WBC 15.2 (H) 02/10/2022   RBC 3.86 (L) 02/10/2022   HGB 10.3 (L) 02/10/2022   HCT 33.6 (L) 02/10/2022   MCV 87.0 02/10/2022   MCH 26.7 02/10/2022   PLT 382 02/10/2022   MCHC 30.7 02/10/2022   RDW 14.7 02/10/2022   LYMPHSABS 3.4 02/07/2022   MONOABS 1.3 (H) 02/07/2022   EOSABS 0.1 02/07/2022   BASOSABS 0.1 02/07/2022     Last metabolic panel Lab Results  Component Value Date   NA 131 (L) 02/09/2022   K 3.5 02/09/2022   CL 99 02/09/2022   CO2 24 02/09/2022   BUN 10 02/09/2022   CREATININE 0.68  02/09/2022   GLUCOSE 118 (H) 02/09/2022   GFRNONAA >60 02/09/2022   GFRAA 131 07/22/2020   CALCIUM 9.7 02/09/2022   PROT 8.2 (H) 02/07/2022   ALBUMIN 2.6 (L) 02/07/2022   LABGLOB 4.2 06/01/2021   AGRATIO 0.9 (L) 06/01/2021   BILITOT 0.8 02/07/2022   ALKPHOS 82 02/07/2022   AST 13 (L) 02/07/2022   ALT 11 02/07/2022   ANIONGAP 8 02/09/2022    GFR: Estimated Creatinine Clearance: 151.4 mL/min (by C-G formula based on SCr of 0.68 mg/dL).  Recent Results (from the past 240 hour(s))  Urine Culture     Status: Abnormal   Collection Time: 02/06/22  1:53 AM   Specimen: In/Out Cath Urine  Result Value Ref Range Status   Specimen Description IN/OUT CATH URINE  Final   Special Requests   Final    NONE Performed at [facility], [address] [place], [place] [address]    Culture MULTIPLE SPECIES PRESENT, SUGGEST RECOLLECTION (A)  Final   Report Status 02/07/2022 FINAL  Final  Blood culture (routine x 2)     Status: None (Preliminary result)   Collection Time: 02/06/22  3:20 AM   Specimen: BLOOD  Result Value Ref Range Status   Specimen Description BLOOD LEFT ANTECUBITAL  Final   Special Requests   Final    BOTTLES DRAWN AEROBIC AND ANAEROBIC Blood Culture adequate volume   Culture   Final    NO GROWTH 3 DAYS Performed at [facility], [address] [place], [place] [address]  Report Status PENDING  Incomplete  Resp Panel by RT-PCR (Flu A&B, Covid) Anterior Nasal Swab     Status: None   Collection Time: 02/06/22  3:23 AM   Specimen: Anterior Nasal Swab  Result Value Ref Range Status   SARS Coronavirus 2 by RT PCR NEGATIVE NEGATIVE Final    Comment: (NOTE) SARS-CoV-2 target nucleic acids are NOT DETECTED.  The SARS-CoV-2 RNA is generally detectable in upper respiratory specimens during the acute phase of infection. The lowest concentration of SARS-CoV-2 viral copies this assay can detect is 138 copies/mL. A negative result does not preclude  SARS-Cov-2 infection and should not be used as the sole basis for treatment or other patient management decisions. A negative result may occur with  improper specimen collection/handling, submission of specimen other than nasopharyngeal swab, presence of viral mutation(s) within the areas targeted by this assay, and inadequate number of viral copies(<138 copies/mL). A negative result must be combined with clinical observations, patient history, and epidemiological information. The expected result is Negative.  Fact Sheet for Patients:  BloggerCourse.com  Fact Sheet for Healthcare Providers:  SeriousBroker.it  This test is no t yet approved or cleared by the [place] FDA and  has been authorized for detection and/or diagnosis of SARS-CoV-2 by FDA under an Emergency Use Authorization (EUA). This EUA will remain  in effect (meaning this test can be used) for the duration of the COVID-19 declaration under Section 564(b)(1) of the Act, 21 U.S.C.section 360bbb-3(b)(1), unless the authorization is terminated  or revoked sooner.       Influenza A by PCR NEGATIVE NEGATIVE Final   Influenza B by PCR NEGATIVE NEGATIVE Final    Comment: (NOTE) The Xpert Xpress SARS-CoV-2/FLU/RSV plus assay is intended as an aid in the diagnosis of influenza from Nasopharyngeal swab specimens and should not be used as a sole basis for treatment. Nasal washings and aspirates are unacceptable for Xpert Xpress SARS-CoV-2/FLU/RSV testing.  Fact Sheet for Patients: BloggerCourse.com  Fact Sheet for Healthcare Providers: SeriousBroker.it  This test is not yet approved or cleared by the [place] FDA and has been authorized for detection and/or diagnosis of SARS-CoV-2 by FDA under an Emergency Use Authorization (EUA). This EUA will remain in effect (meaning this test can be used) for the duration of  the COVID-19 declaration under Section 564(b)(1) of the Act, 21 U.S.C. section 360bbb-3(b)(1), unless the authorization is terminated or revoked.  Performed at [facility], [address], [place], [place] [address]   Blood culture (routine x 2)     Status: None (Preliminary result)   Collection Time: 02/06/22  3:25 AM   Specimen: BLOOD RIGHT ARM  Result Value Ref Range Status   Specimen Description BLOOD RIGHT ARM  Final   Special Requests   Final    BOTTLES DRAWN AEROBIC AND ANAEROBIC Blood Culture adequate volume   Culture   Final    NO GROWTH 3 DAYS Performed at [facility], [address] [place], [place] [address]    Report Status PENDING  Incomplete      Radiology Studies: No results found.    LOS: 4 days    Jacquelin Hawking, MD [facility] 02/10/2022, 1:01 PM   If 7PM-7AM, please contact night-coverage [email or web address]

## 2022-02-11 ENCOUNTER — Other Ambulatory Visit: Payer: Self-pay

## 2022-02-11 ENCOUNTER — Other Ambulatory Visit (HOSPITAL_COMMUNITY): Payer: Self-pay

## 2022-02-11 LAB — CBC
HCT: 37.3 % (ref 36.0–46.0)
Hemoglobin: 11.3 g/dL — ABNORMAL LOW (ref 12.0–15.0)
MCH: 26.4 pg (ref 26.0–34.0)
MCHC: 30.3 g/dL (ref 30.0–36.0)
MCV: 87.1 fL (ref 80.0–100.0)
Platelets: 420 10*3/uL — ABNORMAL HIGH (ref 150–400)
RBC: 4.28 MIL/uL (ref 3.87–5.11)
RDW: 14.9 % (ref 11.5–15.5)
WBC: 13 10*3/uL — ABNORMAL HIGH (ref 4.0–10.5)
nRBC: 0 % (ref 0.0–0.2)

## 2022-02-11 LAB — CULTURE, BLOOD (ROUTINE X 2)
Culture: NO GROWTH
Culture: NO GROWTH
Special Requests: ADEQUATE
Special Requests: ADEQUATE

## 2022-02-11 LAB — GLUCOSE, CAPILLARY
Glucose-Capillary: 129 mg/dL — ABNORMAL HIGH (ref 70–99)
Glucose-Capillary: 137 mg/dL — ABNORMAL HIGH (ref 70–99)

## 2022-02-11 MED ORDER — GLIPIZIDE 5 MG PO TABS: 5.0000 mg | ORAL_TABLET | Freq: Two times a day (BID) | ORAL | Status: DC

## 2022-02-11 MED ORDER — CHOLECALCIFEROL 25 MCG (1000 UT) PO TABS
1000.0000 [IU] | ORAL_TABLET | Freq: Every day | ORAL | 3 refills | Status: AC
  Filled 2022-02-11 – 2022-05-03 (×3): qty 90, 90d supply, fill #0

## 2022-02-11 MED ORDER — OXYCODONE-ACETAMINOPHEN 5-325 MG PO TABS
1.0000 | ORAL_TABLET | Freq: Three times a day (TID) | ORAL | 0 refills | Status: AC | PRN
  Filled 2022-02-11: qty 15, 5d supply, fill #0

## 2022-02-11 MED ORDER — VITAMIN D (ERGOCALCIFEROL) 1.25 MG (50000 UNIT) PO CAPS
50000.0000 [IU] | ORAL_CAPSULE | ORAL | 0 refills | Status: DC
  Filled 2022-02-11: qty 4, 28d supply, fill #0

## 2022-02-11 MED ORDER — CIPROFLOXACIN HCL 500 MG PO TABS
500.0000 mg | ORAL_TABLET | Freq: Two times a day (BID) | ORAL | 0 refills | Status: AC
  Filled 2022-02-11: qty 10, 5d supply, fill #0

## 2022-02-11 MED ORDER — METRONIDAZOLE 500 MG PO TABS
500.0000 mg | ORAL_TABLET | Freq: Two times a day (BID) | ORAL | 0 refills | Status: AC
Start: 2022-02-11 — End: 2022-02-16
  Filled 2022-02-11: qty 10, 5d supply, fill #0

## 2022-02-11 NOTE — Progress Notes (Signed)
Mobility Specialist Progress Note:   02/11/22 1124  Mobility  Activity Ambulated with assistance in hallway  Level of Assistance Independent  Assistive Device  (hall rails)  Distance Ambulated (ft) 200 ft  Activity Response Tolerated well  $Mobility charge 1 Mobility   Pt received in chair willing to participate in mobility. No complaints of pain. Left in chair with call bell in reach and all needs met.   [facility] Emric Kowalewski Mobility Specialist

## 2022-02-11 NOTE — Discharge Summary (Signed)
Physician Discharge Summary   Patient: Kaylee [name] MRN: [id number] DOB: December 30, 1970  Admit date:     02/06/2022  Discharge date: 02/11/22  Discharge Physician: Cordelia Poche, MD   PCP: Elsie Stain, MD   Recommendations at discharge:  Gastroenterology follow-up for consideration of elective colonoscopy in 4 weeks  Discharge Diagnoses: Principal Problem:   Diverticulitis large intestine w/o perforation or abscess w/o bleeding Active Problems:   Sepsis ([place])   Lactic acidosis   Hypercalcemia   Type 2 diabetes mellitus with hyperglycemia, without long-term current use of insulin (HCC)   Chronic systolic CHF (congestive heart failure) ([place])   Essential hypertension   Mixed diabetic hyperlipidemia associated with type 2 diabetes mellitus ([place])   Obesity, morbid, BMI 50 or higher ([place])   Hyponatremia  Resolved Problems:   * No resolved hospital problems. *  Hospital Course: 51 year old female with past medical history of hyperlipidemia, diabetes mellitus type 2, hypertension, diastolic congestive heart failure, morbid obesity, iron deficiency anemia presented to the hospital with lower abdominal pain for 1 week with worsening symptoms for couple of days prior to presentation..  Patient also had weakness, fatigue and intermittent headache with poor oral intake.  Patient had initially gone to local urgent care clinic on 01/27/22 and at that time was diagnosed with a urinary tract infection.  Patient given a dose of intramuscular ceftriaxone  and was sent home with a course of cefdinir over the next 10 days.  Patient had mild improvement in her symptoms initially but then started having worsening lower abdominal pain and intermittent fevers and weakness.  In the ED, patient was febrile tachycardic with significant leukocytosis.  Patient underwent CT scan of the abdomen pelvis which showed acute diverticulitis.  Patient was was given cefepime IV and then was considered for admission to the  hospital.  General surgery was also consulted during hospitalization due to concerns for involvement of the left adnexa bladder and ureter area. Patient managed on Zosyn IV with improvement in symptoms and leukocytosis. She was then transitioned to Ciprofloxacin and Flagyl  Assessment and Plan:  Acute diverticulitis large intestine w/o perforation or abscess w/o bleeding CT scan consistent with acute diverticulitis with involvement of left adnexa, left ureter and bladder dome. General surgery consulted and provided recommendations for management. Patient started empirically on Zosyn IV and provided IV fluids. Pain improving. Fever curve is trending down with leukocytosis improving. Antibiotics transitioned to oral on 6/7 by general surgery. Afebrile overnight.   Sepsis secondary to diverticulitis, UTI Present on admission with fever, tachycardia and elevated lactate. Secondary to acute diverticulitis. Blood cultures without growth to date. Leukocytosis improving with above treatment plan.   Lactic acidosis Secondary to sepsis. Patient managed with IV fluid resuscitation.   Hypercalcemia Review of previous records showing mild hypercalcemia.  Vitamin D level at 13.5.  PTH normal at 39. Hypercalcemia resolved. PTH related peptide pending.   Vitamin D deficiency 25-hydroxy Vitamin D of 13.55. started on Vitamin D supplementation while admitted. Discharge with vitamin D supplementation.   Type 2 diabetes mellitus with hyperglycemia, without long-term current use of insulin (HCC) Hemoglobin A1C of 7.1%. patietn is on Farxiga and glipizide as an outpatient. Started on SSI while inpatient. Continue Farxiga. Resume glipizide after completion of Ciprofloxacin.   Chronic systolic CHF (congestive heart failure) (HCC) Stable. Patient is on Losartan, metoprolol and Lasix as an outpatient. Patient initially required fluid resuscitation which has been discontinued. Continue home regimen.   Essential  hypertension Continue home losartan and metoprolol  Mixed diabetic hyperlipidemia associated with type 2 diabetes mellitus (HCC) Continue Lipitor   Obesity, morbid, BMI 50 or higher ([place]) Body mass index is 67.63 kg/m. Patient will need outpatient follow-up with PCP for discussion/plan for weight loss.   Hyponatremia Mild. Stable.    Consultants: General surgery Procedures performed: None  Disposition: Home Diet recommendation: Low fiber diet  DISCHARGE MEDICATION: Allergies as of 02/11/2022   No Known Allergies      Medication List     STOP taking these medications    cefdinir 300 MG capsule Commonly known as: OMNICEF       TAKE these medications    albuterol 108 (90 Base) MCG/ACT inhaler Commonly known as: VENTOLIN HFA Inhale 2 puffs into the lungs every 6 (six) hours as needed for wheezing or shortness of breath.   atorvastatin 40 MG tablet Commonly known as: LIPITOR TAKE 1 TABLET (40 MG TOTAL) BY MOUTH DAILY.   Cholecalciferol 25 MCG (1000 UT) tablet Take 1 tablet (1,000 Units total) by mouth once daily. Start taking on: March 22, 2022   ciprofloxacin 500 MG tablet Commonly known as: CIPRO Take 1 tablet (500 mg total) by mouth 2 (two) times daily for 5 days.   Farxiga 10 MG Tabs tablet Generic drug: dapagliflozin propanediol Take 1 tablet (10 mg total) by mouth daily before breakfast.   ferrous sulfate 325 (65 FE) MG tablet TAKE 1 TABLET (325 MG TOTAL) BY MOUTH 2 (TWO) TIMES DAILY WITH A MEAL. MUST HAVE OFFICE VISIT FOR REFILLS What changed:  how much to take how to take this when to take this   fluticasone-salmeterol 250-50 MCG/ACT Aepb Commonly known as: ADVAIR Inhale 1 puff into the lungs 2 (two) times daily.   furosemide 20 MG tablet Commonly known as: LASIX TAKE 1 TABLET (20 MG TOTAL) BY MOUTH DAILY. What changed: how much to take   glipiZIDE 5 MG tablet Commonly known as: GLUCOTROL Take 1 tablet (5 mg total) by mouth 2 (two) times  daily before a meal. Start taking on: February 16, 2022 What changed:  how much to take These instructions start on February 16, 2022. If you are unsure what to do until then, ask your doctor or other care provider.   losartan 50 MG tablet Commonly known as: COZAAR TAKE 1 TABLET (50 MG TOTAL) BY MOUTH DAILY.   metoprolol tartrate 25 MG tablet Commonly known as: LOPRESSOR Take 3 tablets (75 mg total) by mouth 2 (two) times daily.   metroNIDAZOLE 500 MG tablet Commonly known as: FLAGYL Take 1 tablet (500 mg total) by mouth every 12 (twelve) hours for 5 days.   montelukast 10 MG tablet Commonly known as: SINGULAIR TAKE 1 TABLET (10 MG TOTAL) BY MOUTH AT BEDTIME.   ondansetron 4 MG disintegrating tablet Commonly known as: ZOFRAN-ODT Take 1 tablet (4 mg total) by mouth every 8 (eight) hours as needed for nausea or vomiting.   oxyCODONE-acetaminophen 5-325 MG tablet Commonly known as: PERCOCET/ROXICET Take 1 tablet by mouth every 8 (eight) hours as needed for up to 5 days for moderate pain or severe pain.   potassium chloride 10 MEQ tablet Commonly known as: KLOR-CON TAKE 1 TABLET (10 MEQ TOTAL) BY MOUTH DAILY. What changed: how much to take   True Metrix Blood Glucose Test test strip Generic drug: glucose blood Use to check blood sugar 3 times daily.   glucose blood test strip USE TO CHECK BLOOD SUGAR 3 TIMES DAILY   True Metrix Meter w/Device Kit Use to  check blood sugar TID.   TRUEplus Lancets 28G Misc Use to check blood sugar 3 times daily.   Vitamin D (Ergocalciferol) 1.25 MG (50000 UNIT) Caps capsule Commonly known as: DRISDOL Take 1 capsule (50,000 Units total) by mouth every 7 (seven) days. Start taking on: February 15, 2022        Follow-up Information     O'Neal, Cassie Freer, MD .   Specialties: Cardiology, Internal Medicine, Radiology Contact information: [place] [address] [phone]         Elsie Stain, MD. Go on 03/10/2022.    Specialty: Pulmonary Disease Why: _0 :10am Contact information: [address] [place] [address] [phone]                Discharge Exam: BP 115/84   Pulse (!) 111   Temp 99.3 F (37.4 C)   Resp 20   Ht _1  (1.702 m)   Wt (!) 195.3 kg   SpO2 93%   BMI 67.44 kg/m   General: Well appearing, no distress  Condition at discharge: stable  The results of significant diagnostics from this hospitalization (including imaging, microbiology, ancillary and laboratory) are listed below for reference.   Imaging Studies: CT ABDOMEN PELVIS W CONTRAST  Result Date: 02/06/2022 CLINICAL DATA:  51 year old female with macroscopic hematuria. EXAM: CT ABDOMEN AND PELVIS WITH CONTRAST TECHNIQUE: Multidetector CT imaging of the abdomen and pelvis was performed using the standard protocol following bolus administration of intravenous contrast. RADIATION DOSE REDUCTION: This exam was performed according to the departmental dose-optimization program which includes automated exposure control, adjustment of the mA and/or kV according to patient size and/or use of iterative reconstruction technique. CONTRAST:  170m OMNIPAQUE IOHEXOL 300 MG/ML  SOLN COMPARISON:  None Available. FINDINGS: Lower chest: Large body habitus, otherwise negative. No pericardial or pleural effusion. Hepatobiliary: Probable hepatic steatosis. Otherwise negative liver and gallbladder. Pancreas: Negative. Spleen: Negative. Adrenals/Urinary Tract: Normal adrenal glands. Nonobstructed kidneys with symmetric renal enhancement. However, little to no contrast excretion on 2 minute delayed images. Still, no pararenal inflammation identified. Ureters appear decompressed. However, there is confluent inflammation along the course of the left ureter at the pelvic inlet. Mild secondary inflammation of the bladder including the bladder dome. See intestinal details below. Stomach/Bowel: Negative rectum and distal sigmoid colon.  But the proximal sigmoid colon is inflamed and indistinct along a segment of about 10 cm in the left lower quadrant. Confluent regional mesenteric inflammation contiguous with the left adnexa and bladder dome. Confluent regional retroperitoneal inflammation at the pelvic inlet affecting the course of the left ureter. Underlying diverticulosis. No extraluminal gas identified. No definite extraluminal collection of fluid. Upstream descending colon with additional diverticulosis but decompressed lumen. Diverticulosis also throughout the transverse colon. Right colon mostly decompressed. Normal appendix on series 3, image 59. No dilated small bowel. Little secondary inflammation of small bowel in the left lower quadrant. Decompressed stomach and duodenum. No free fluid identified. Vascular/Lymphatic: Suboptimal intravascular contrast bolus. Major arterial structures grossly patent. Reactive appearing retroperitoneal lymphadenopathy at the renal lower pole levels, somewhat bilateral. But confluent soft tissue inflammation and/or additional asymmetric lymph nodes at the left pelvic inlet, iliac stations. Reproductive: Uterus and right adnexa appear within normal limits, but the left adnexa is inseparable from confluent inflammation in the left lower quadrant (series 3, image 72). No gas within the uterus or vagina. Other: Large body habitus. No pelvic free fluid. Musculoskeletal: Advanced disc and endplate degeneration at the lumbosacral junction. No  acute osseous abnormality identified. IMPRESSION: 1. Confluent inflammatory process in the left lower quadrant and left pelvic inlet appears related to a Complicated Sigmoid Diverticulitis. Involvement of the left adnexa, course of the left ureter, and bladder dome. Pelvic inlet and retroperitoneal lymphadenopathy appears reactive. No pneumoperitoneum. No definite extraluminal fluid collection although CT Abdomen and Pelvis with both oral and IV contrast would be more  sensitive. 2. Kidneys and ureters appear nonobstructed but there is little to no renal excretion of contrast on the delayed images, suggesting intrinsic renal insufficiency. 3. Large body habitus, which limits some anatomic detail. Probable hepatic steatosis. Electronically Signed   By: Genevie Ann M.D.   On: 02/06/2022 04:58   DG Chest Port 1 View  Result Date: 02/06/2022 CLINICAL DATA:  50 year old female with possible sepsis. EXAM: PORTABLE CHEST 1 VIEW COMPARISON:  Chest radiographs 10/12/2018. FINDINGS: Portable AP semi upright view at 0408 hours. Chronic borderline to mild cardiomegaly. Stable cardiac size and mediastinal contours. Visualized tracheal air column is within normal limits. Chronic large body habitus. Allowing for portable technique the lungs are clear. No bowel gas visible in the upper abdomen. No acute osseous abnormality identified. IMPRESSION: Chronic borderline to mild cardiomegaly. No acute cardiopulmonary abnormality. Electronically Signed   By: Genevie Ann M.D.   On: 02/06/2022 04:24    Microbiology: Results for orders placed or performed during the hospital encounter of 02/06/22  Urine Culture     Status: Abnormal   Collection Time: 02/06/22  1:53 AM   Specimen: In/Out Cath Urine  Result Value Ref Range Status   Specimen Description IN/OUT CATH URINE  Final   Special Requests   Final    NONE Performed at [facility], [place] [address], [place], [place] [address]    Culture MULTIPLE SPECIES PRESENT, SUGGEST RECOLLECTION (A)  Final   Report Status 02/07/2022 FINAL  Final  Blood culture (routine x 2)     Status: None   Collection Time: 02/06/22  3:20 AM   Specimen: BLOOD  Result Value Ref Range Status   Specimen Description BLOOD LEFT ANTECUBITAL  Final   Special Requests   Final    BOTTLES DRAWN AEROBIC AND ANAEROBIC Blood Culture adequate volume   Culture   Final    NO GROWTH 5 DAYS Performed at [facility], [place] [address] [place], [place] [address]     Report Status 02/11/2022 FINAL  Final  Resp Panel by RT-PCR (Flu A&B, Covid) Anterior Nasal Swab     Status: None   Collection Time: 02/06/22  3:23 AM   Specimen: Anterior Nasal Swab  Result Value Ref Range Status   SARS Coronavirus 2 by RT PCR NEGATIVE NEGATIVE Final    Comment: (NOTE) SARS-CoV-2 target nucleic acids are NOT DETECTED.  The SARS-CoV-2 RNA is generally detectable in upper respiratory specimens during the acute phase of infection. The lowest concentration of SARS-CoV-2 viral copies this assay can detect is 138 copies/mL. A negative result does not preclude SARS-Cov-2 infection and should not be used as the sole basis for treatment or other patient management decisions. A negative result may occur with  improper specimen collection/handling, submission of specimen other than nasopharyngeal swab, presence of viral mutation(s) within the areas targeted by this assay, and inadequate number of viral copies(<138 copies/mL). A negative result must be combined with clinical observations, patient history, and epidemiological information. The expected result is Negative.  Fact Sheet for Patients:  EntrepreneurPulse.com.au  Fact Sheet for Healthcare Providers:  IncredibleEmployment.be  This test is no t yet approved or cleared by the [place] and  has been authorized for detection and/or diagnosis of SARS-CoV-2 by FDA under an Emergency Use Authorization (EUA). This EUA will remain  in effect (meaning this test can be used) for the duration of the COVID-19 declaration under Section 564(b)(1) of the Act, 21 U.S.C.section 360bbb-3(b)(1), unless the authorization is terminated  or revoked sooner.       Influenza A by PCR NEGATIVE NEGATIVE Final   Influenza B by PCR NEGATIVE NEGATIVE Final    Comment: (NOTE) The Xpert Xpress SARS-CoV-2/FLU/RSV plus assay is intended as an aid in the diagnosis of influenza from Nasopharyngeal swab  specimens and should not be used as a sole basis for treatment. Nasal washings and aspirates are unacceptable for Xpert Xpress SARS-CoV-2/FLU/RSV testing.  Fact Sheet for Patients: EntrepreneurPulse.com.au  Fact Sheet for Healthcare Providers: IncredibleEmployment.be  This test is not yet approved or cleared by the [place] FDA and has been authorized for detection and/or diagnosis of SARS-CoV-2 by FDA under an Emergency Use Authorization (EUA). This EUA will remain in effect (meaning this test can be used) for the duration of the COVID-19 declaration under Section 564(b)(1) of the Act, 21 U.S.C. section 360bbb-3(b)(1), unless the authorization is terminated or revoked.  Performed at [facility], [place] [address], [place], [place] [address]   Blood culture (routine x 2)     Status: None   Collection Time: 02/06/22  3:25 AM   Specimen: BLOOD RIGHT ARM  Result Value Ref Range Status   Specimen Description BLOOD RIGHT ARM  Final   Special Requests   Final    BOTTLES DRAWN AEROBIC AND ANAEROBIC Blood Culture adequate volume   Culture   Final    NO GROWTH 5 DAYS Performed at [facility], [place] [address] [place], [place] [address]    Report Status 02/11/2022 FINAL  Final    Labs: CBC: Recent Labs  Lab 02/06/22 0146 02/07/22 3428 02/08/22 0436 02/09/22 0356 02/10/22 0310 02/11/22 0853  WBC 19.1* 17.0* 15.3* 13.6* 15.2* 13.0*  NEUTROABS 11.2* 12.2*  --   --   --   --   HGB 13.2 11.0* 11.4* 10.9* 10.3* 11.3*  HCT 43.0 36.0 37.8 35.3* 33.6* 37.3  MCV 87.9 87.0 87.9 87.2 87.0 87.1  PLT 443* 365 359 341 382 768*   Basic Metabolic Panel: Recent Labs  Lab 02/06/22 0146 02/07/22 0642 02/08/22 0436 02/09/22 0356  NA 133* 131* 132* 131*  K 4.0 3.8 3.7 3.5  CL 100 104 100 99  CO2 _0 GLUCOSE 153* 126* 119* 118*  BUN <5* _1 CREATININE 0.74 0.89 0.83 0.68  CALCIUM 10.4* 9.7 9.7 9.7  MG  --  1.8 1.9 1.8    Liver Function Tests: Recent Labs  Lab 02/06/22 0146 02/07/22 0642  AST 16 13*  ALT 13 11  ALKPHOS 102 82  BILITOT 0.6 0.8  PROT 9.5* 8.2*  ALBUMIN 3.0* 2.6*   CBG: Recent Labs  Lab 02/10/22 1100 02/10/22 1646 02/10/22 2129 02/11/22 0637 02/11/22 1058  GLUCAP 123* 110* 149* 129* 137*    Discharge time spent:  35 minutes.  Signed: Cordelia Poche, MD [facility] 02/11/2022

## 2022-02-11 NOTE — Progress Notes (Addendum)
Subjective: Feels great today.  Eating breakfast.  Denies any abdominal pain at all.  ROS: See above, otherwise other systems negative  Objective: Vital signs in last 24 hours: Temp:  [98.8 F (37.1 C)-99.7 F (37.6 C)] 98.8 F (37.1 C) (06/09 0519) Pulse Rate:  [86-102] 91 (06/09 0519) Resp:  [19-20] 19 (06/09 0519) BP: (91-119)/(53-80) 99/53 (06/09 0519) SpO2:  [93 %-99 %] 93 % (06/09 0519) Weight:  [195.3 kg] 195.3 kg (06/09 0519) Last BM Date : 02/10/22  Intake/Output from previous day: No intake/output data recorded. Intake/Output this shift: No intake/output data recorded.  PE: Abd: soft, nontender, morbidly obese, +BS  Lab Results:  Recent Labs    02/09/22 0356 02/10/22 0310  WBC 13.6* 15.2*  HGB 10.9* 10.3*  HCT 35.3* 33.6*  PLT 341 382   BMET Recent Labs    02/09/22 0356  NA 131*  K 3.5  CL 99  CO2 24  GLUCOSE 118*  BUN 10  CREATININE 0.68  CALCIUM 9.7   PT/INR No results for input(s): "LABPROT", "INR" in the last 72 hours.  CMP     Component Value Date/Time   NA 131 (L) 02/09/2022 0356   NA 137 06/01/2021 1125   K 3.5 02/09/2022 0356   CL 99 02/09/2022 0356   CO2 24 02/09/2022 0356   GLUCOSE 118 (H) 02/09/2022 0356   BUN 10 02/09/2022 0356   BUN 4 (L) 06/01/2021 1125   CREATININE 0.68 02/09/2022 0356   CALCIUM 9.7 02/09/2022 0356   PROT 8.2 (H) 02/07/2022 0642   PROT 7.9 06/01/2021 1125   ALBUMIN 2.6 (L) 02/07/2022 0642   ALBUMIN 3.7 (L) 06/01/2021 1125   AST 13 (L) 02/07/2022 0642   ALT 11 02/07/2022 0642   ALKPHOS 82 02/07/2022 0642   BILITOT 0.8 02/07/2022 0642   BILITOT <0.2 06/01/2021 1125   GFRNONAA >60 02/09/2022 0356   GFRAA 131 07/22/2020 0907   Lipase  No results found for: "LIPASE"     Studies/Results: No results found.  Anti-infectives: Anti-infectives (From admission, onward)    Start     Dose/Rate Route Frequency Ordered Stop   02/09/22 1045  ciprofloxacin (CIPRO) tablet 750 mg        750 mg  Oral 2 times daily 02/09/22 0946     02/09/22 1045  metroNIDAZOLE (FLAGYL) tablet 500 mg        500 mg Oral Every 12 hours 02/09/22 0946     02/06/22 0800  piperacillin-tazobactam (ZOSYN) IVPB 3.375 g  Status:  Discontinued        3.375 g 12.5 mL/hr over 240 Minutes Intravenous Every 8 hours 02/06/22 0546 02/09/22 0946   02/06/22 0330  cefTRIAXone (ROCEPHIN) 2 g in sodium chloride 0.9 % 100 mL IVPB  Status:  Discontinued        2 g 200 mL/hr over 30 Minutes Intravenous Every 24 hours 02/06/22 0324 02/06/22 0327   02/06/22 0330  ceFEPIme (MAXIPIME) 2 g in sodium chloride 0.9 % 100 mL IVPB        2 g 200 mL/hr over 30 Minutes Intravenous  Once 02/06/22 0327 02/06/22 0409        Assessment/Plan Acute sigmoid diverticulitis with possible involvement of L adnexa and L ureter/bladder dome  - Afebrile, WBC pending today. pain free, symptoms clinically appear to continue to be resolved - Carb modified diet; PO abx -if WBC improved, can DC home on oral abx for a total of 10 days -will  need to see GI as outpatient for screening colonoscopy as well as follow up from having diverticulitis -no surgical follow up needed at this time.   FEN: Diabetic diet VTE: LMWH 100 mg daily  ID: Zosyn 6/4>>6/7; cipro/flagyl 6/7>>   - below per TR[facility] -  HTN [id number] Chronic systolic CHF Lymphedema Morbid obesity  I reviewed hospitalist notes, last 24 h vitals and pain scores, last 48 h intake and output, last 24 h labs and trends, and last 24 h imaging results.    LOS: 5 days   Henreitta Cea, [facility], [facility]

## 2022-02-11 NOTE — Progress Notes (Signed)
Patient is about to discharge home with her daughter. VS taken and recorded. All are within normal limits except elevated HR of 111. Pt states her HR has always been high and she takes medication for it.

## 2022-02-11 NOTE — TOC Transition Note (Signed)
Transition of Care [facility]) - CM/SW Discharge Note   Patient Details  Name: Kaylee [name] MRN: [id number] Date of Birth: 1971/04/24  [facility] CM/SW Contact:  Leone Haven, RN Phone Number: 02/11/2022, 11:12 AM   Clinical Narrative:    Patient is for dc today, she has no needs.          Patient Goals and CMS Choice        Discharge Placement                       Discharge Plan and Services                                     Social Determinants of Health (SDOH) Interventions     Readmission Risk Interventions     No data to display

## 2022-02-11 NOTE — Plan of Care (Signed)
  Problem: Education: Goal: Ability to describe self-care measures that may prevent or decrease complications (Diabetes Survival Skills Education) will improve Outcome: Progressing Goal: Individualized Educational Video(s) Outcome: Progressing   Problem: Coping: Goal: Ability to adjust to condition or change in health will improve Outcome: Progressing   Problem: Fluid Volume: Goal: Ability to maintain a balanced intake and output will improve Outcome: Progressing   Problem: Health Behavior/Discharge Planning: Goal: Ability to identify and utilize available resources and services will improve Outcome: Progressing Goal: Ability to manage health-related needs will improve Outcome: Progressing   Problem: Metabolic: Goal: Ability to maintain appropriate glucose levels will improve Outcome: Progressing   Problem: Nutritional: Goal: Maintenance of adequate nutrition will improve Outcome: Progressing Goal: Progress toward achieving an optimal weight will improve Outcome: Progressing   Problem: Tissue Perfusion: Goal: Adequacy of tissue perfusion will improve Outcome: Progressing   Problem: Fluid Volume: Goal: Hemodynamic stability will improve Outcome: Progressing   Problem: Clinical Measurements: Goal: Diagnostic test results will improve Outcome: Progressing Goal: Signs and symptoms of infection will decrease Outcome: Progressing   Problem: Respiratory: Goal: Ability to maintain adequate ventilation will improve Outcome: Progressing   Problem: Education: Goal: Knowledge of General Education information will improve Description: Including pain rating scale, medication(s)/side effects and non-pharmacologic comfort measures Outcome: Progressing   Problem: Health Behavior/Discharge Planning: Goal: Ability to manage health-related needs will improve Outcome: Progressing   Problem: Clinical Measurements: Goal: Ability to maintain clinical measurements within normal  limits will improve Outcome: Progressing Goal: Will remain free from infection Outcome: Progressing Goal: Diagnostic test results will improve Outcome: Progressing Goal: Respiratory complications will improve Outcome: Progressing Goal: Cardiovascular complication will be avoided Outcome: Progressing   Problem: Activity: Goal: Risk for activity intolerance will decrease Outcome: Progressing   Problem: Nutrition: Goal: Adequate nutrition will be maintained Outcome: Progressing   Problem: Coping: Goal: Level of anxiety will decrease Outcome: Progressing   Problem: Pain Managment: Goal: General experience of comfort will improve Outcome: Progressing   Problem: Safety: Goal: Ability to remain free from injury will improve Outcome: Progressing

## 2022-02-13 LAB — PTH-RELATED PEPTIDE: PTH-related peptide: <2 pmol/L

## 2022-02-14 ENCOUNTER — Telehealth: Payer: Self-pay

## 2022-02-14 NOTE — Telephone Encounter (Signed)
Transition Care Management Unsuccessful Follow-up Telephone Call  Date of discharge and from where:  02/11/2022, [facility]  Attempts:  1st Attempt  Reason for unsuccessful TCM follow-up call:  Unable to reach patient # [phone], rings fast busy.  Call also placed to #  [phone] and the recording stated that the call cannot be completed at this time   Patient has an appointment with Corene Cornea, PA at [facility] - 03/10/2022

## 2022-02-15 ENCOUNTER — Telehealth: Payer: Self-pay

## 2022-02-15 NOTE — Telephone Encounter (Signed)
Transition Care Management Unsuccessful Follow-up Telephone Call  Date of discharge and from where:  02/11/2022, [facility]  Attempts:  2nd Attempt  Reason for unsuccessful TCM follow-up call:  Unable to reach patient- # [phone], rings fast busy.  Call also placed to #  [phone] and the recording stated that the call cannot be completed at this time  Call placed to # [phone], the phone rings then goes to fast busy.    Patient has an appointment with Corene Cornea, PA at [facility] - 03/10/2022

## 2022-02-16 ENCOUNTER — Telehealth: Payer: Self-pay

## 2022-02-16 NOTE — Telephone Encounter (Signed)
Transition Care Management Unsuccessful Follow-up Telephone Call  Date of discharge and from where:  02/11/2022, [facility]   Attempts:  3rd Attempt  Reason for unsuccessful TCM follow-up call:  Unable to reach patient  # [phone], rings fast busy.  Call also placed to #  [phone] and the recording stated that the call cannot be completed at this time  Call placed to # [phone], the phone rings then goes to fast busy.    Patient has an appointment with Corene Cornea, PA at [facility] - 03/10/2022

## 2022-03-09 NOTE — Progress Notes (Signed)
Patient ID: Kaylee [name], female   DOB: [birth year], 51 y.o.   MRN: [id number] Recommendations at discharge:  Gastroenterology follow-up for consideration of elective colonoscopy in 4 weeks   Discharge Diagnoses: Principal Problem:   Diverticulitis large intestine w/o perforation or abscess w/o bleeding Active Problems:   Sepsis (HCC)   Lactic acidosis   Hypercalcemia   Type 2 diabetes mellitus with hyperglycemia, without long-term current use of insulin (HCC)   Chronic systolic CHF (congestive heart failure) (HCC)   Essential hypertension   Mixed diabetic hyperlipidemia associated with type 2 diabetes mellitus (HCC)   Obesity, morbid, BMI 50 or higher (HCC)   Hyponatremia   Resolved Problems:   * No resolved hospital problems. *   Hospital Course: 51 year old female with past medical history of hyperlipidemia, diabetes mellitus type 2, hypertension, diastolic congestive heart failure, morbid obesity, iron deficiency anemia presented to the hospital with lower abdominal pain for 1 week with worsening symptoms for couple of days prior to presentation..  Patient also had weakness, fatigue and intermittent headache with poor oral intake.  Patient had initially gone to local urgent care clinic on 01/27/22 and at that time was diagnosed with a urinary tract infection.  Patient given a dose of intramuscular ceftriaxone  and was sent home with a course of cefdinir over the next 10 days.  Patient had mild improvement in her symptoms initially but then started having worsening lower abdominal pain and intermittent fevers and weakness.  In the ED, patient was febrile tachycardic with significant leukocytosis.  Patient underwent CT scan of the abdomen pelvis which showed acute diverticulitis.  Patient was was given cefepime IV and then was considered for admission to the hospital.  General surgery was also consulted during hospitalization due to concerns for involvement of the left adnexa bladder and  ureter area. Patient managed on Zosyn IV with improvement in symptoms and leukocytosis. She was then transitioned to Ciprofloxacin and Flagyl   Assessment and Plan:   Acute diverticulitis large intestine w/o perforation or abscess w/o bleeding CT scan consistent with acute diverticulitis with involvement of left adnexa, left ureter and bladder [name]. General surgery consulted and provided recommendations for management. Patient started empirically on Zosyn IV and provided IV fluids. Pain improving. Fever curve is trending down with leukocytosis improving. Antibiotics transitioned to oral on 6/7 by general surgery. Afebrile overnight.   Sepsis secondary to diverticulitis, UTI Present on admission with fever, tachycardia and elevated lactate. Secondary to acute diverticulitis. Blood cultures without growth to date. Leukocytosis improving with above treatment plan.   Lactic acidosis Secondary to sepsis. Patient managed with IV fluid resuscitation.   Hypercalcemia Review of previous records showing mild hypercalcemia.  Vitamin D level at 13.5.  PTH normal at 39. Hypercalcemia resolved. PTH related peptide pending.   Vitamin D deficiency 25-hydroxy Vitamin D of 13.55. started on Vitamin D supplementation while admitted. Discharge with vitamin D supplementation.   Type 2 diabetes mellitus with hyperglycemia, without long-term current use of insulin (HCC) Hemoglobin A1C of 7.1%. patietn is on Farxiga and glipizide as an outpatient. Started on SSI while inpatient. Continue Farxiga. Resume glipizide after completion of Ciprofloxacin.   Chronic systolic CHF (congestive heart failure) (HCC) Stable. Patient is on Losartan, metoprolol and Lasix as an outpatient. Patient initially required fluid resuscitation which has been discontinued. Continue home regimen.   Essential hypertension Continue home losartan and metoprolol   Mixed diabetic hyperlipidemia associated with type 2 diabetes mellitus  (HCC) Continue Lipitor   Obesity, morbid, BMI  50 or higher (HCC) Body mass index is 67.63 kg/m. Patient will need outpatient follow-up with PCP for discussion/plan for weight loss.   Hyponatremia Mild. Stable.

## 2022-03-10 ENCOUNTER — Encounter: Payer: Self-pay | Admitting: Physician Assistant

## 2022-03-10 ENCOUNTER — Ambulatory Visit: Payer: 59 | Attending: Critical Care Medicine | Admitting: Physician Assistant

## 2022-03-10 ENCOUNTER — Other Ambulatory Visit: Payer: Self-pay

## 2022-03-10 ENCOUNTER — Other Ambulatory Visit (HOSPITAL_COMMUNITY): Payer: Self-pay

## 2022-03-10 VITALS — BP 123/87 | HR 101 | Ht 67.0 in | Wt >= 6400 oz

## 2022-03-10 DIAGNOSIS — E782 Mixed hyperlipidemia: Secondary | ICD-10-CM

## 2022-03-10 DIAGNOSIS — E1165 Type 2 diabetes mellitus with hyperglycemia: Secondary | ICD-10-CM | POA: Diagnosis not present

## 2022-03-10 DIAGNOSIS — Z09 Encounter for follow-up examination after completed treatment for conditions other than malignant neoplasm: Secondary | ICD-10-CM

## 2022-03-10 DIAGNOSIS — E871 Hypo-osmolality and hyponatremia: Secondary | ICD-10-CM

## 2022-03-10 DIAGNOSIS — I10 Essential (primary) hypertension: Secondary | ICD-10-CM

## 2022-03-10 DIAGNOSIS — R202 Paresthesia of skin: Secondary | ICD-10-CM

## 2022-03-10 DIAGNOSIS — I5030 Unspecified diastolic (congestive) heart failure: Secondary | ICD-10-CM

## 2022-03-10 DIAGNOSIS — J4540 Moderate persistent asthma, uncomplicated: Secondary | ICD-10-CM

## 2022-03-10 DIAGNOSIS — K5732 Diverticulitis of large intestine without perforation or abscess without bleeding: Secondary | ICD-10-CM

## 2022-03-10 DIAGNOSIS — E559 Vitamin D deficiency, unspecified: Secondary | ICD-10-CM

## 2022-03-10 LAB — GLUCOSE, POCT (MANUAL RESULT ENTRY): POC Glucose: 134 mg/dl — AB (ref 70–99)

## 2022-03-10 MED ORDER — ATORVASTATIN CALCIUM 40 MG PO TABS
40.0000 mg | ORAL_TABLET | Freq: Every day | ORAL | 1 refills | Status: AC
  Filled 2022-03-10: qty 90, 90d supply, fill #0

## 2022-03-10 MED ORDER — METOPROLOL TARTRATE 25 MG PO TABS
75.0000 mg | ORAL_TABLET | Freq: Two times a day (BID) | ORAL | 1 refills | Status: DC
  Filled 2022-03-10: qty 180, 30d supply, fill #0
  Filled 2022-04-29: qty 180, 30d supply, fill #1
  Filled 2022-05-03: qty 180, 30d supply, fill #0

## 2022-03-10 MED ORDER — POTASSIUM CHLORIDE ER 10 MEQ PO TBCR
10.0000 meq | EXTENDED_RELEASE_TABLET | Freq: Every day | ORAL | 1 refills | Status: DC
Start: 2022-03-10 — End: 2022-12-09
  Filled 2022-03-10: qty 90, 90d supply, fill #0

## 2022-03-10 MED ORDER — VITAMIN D (ERGOCALCIFEROL) 1.25 MG (50000 UNIT) PO CAPS
50000.0000 [IU] | ORAL_CAPSULE | ORAL | 0 refills | Status: AC
  Filled 2022-03-10: qty 4, 28d supply, fill #0

## 2022-03-10 MED ORDER — FLUTICASONE-SALMETEROL 250-50 MCG/ACT IN AEPB
1.0000 | INHALATION_SPRAY | Freq: Two times a day (BID) | RESPIRATORY_TRACT | 6 refills | Status: AC
  Filled 2022-03-10: qty 60, 30d supply, fill #0
  Filled 2022-04-29: qty 60, 30d supply, fill #1
  Filled 2022-05-03: qty 60, 30d supply, fill #0

## 2022-03-10 MED ORDER — FERROUS SULFATE 325 (65 FE) MG PO TABS
ORAL_TABLET | ORAL | 5 refills | Status: DC
  Filled 2022-03-10 – 2022-05-03 (×4): qty 60, 30d supply, fill #0

## 2022-03-10 MED ORDER — LOSARTAN POTASSIUM 50 MG PO TABS
50.0000 mg | ORAL_TABLET | Freq: Every day | ORAL | 1 refills | Status: DC
  Filled 2022-03-10: qty 90, 90d supply, fill #0

## 2022-03-10 MED ORDER — FUROSEMIDE 20 MG PO TABS
ORAL_TABLET | Freq: Every day | ORAL | 5 refills | Status: DC
  Filled 2022-03-10: qty 30, 30d supply, fill #0
  Filled 2022-04-29: qty 30, 30d supply, fill #1
  Filled 2022-05-03: qty 30, 30d supply, fill #0

## 2022-03-10 MED ORDER — DAPAGLIFLOZIN PROPANEDIOL 10 MG PO TABS
10.0000 mg | ORAL_TABLET | Freq: Every day | ORAL | 6 refills | Status: DC
  Filled 2022-03-10: qty 30, 30d supply, fill #0
  Filled 2022-04-29: qty 30, 30d supply, fill #1
  Filled 2022-05-03: qty 30, 30d supply, fill #0

## 2022-03-10 NOTE — Patient Instructions (Signed)
Schedule an appointment with your cardiologist.

## 2022-03-11 ENCOUNTER — Other Ambulatory Visit (HOSPITAL_COMMUNITY): Payer: Self-pay

## 2022-03-11 ENCOUNTER — Encounter: Payer: Self-pay | Admitting: Physician Assistant

## 2022-03-11 ENCOUNTER — Other Ambulatory Visit: Payer: Self-pay

## 2022-03-11 LAB — CBC WITH DIFFERENTIAL/PLATELET
Basophils Absolute: 0.1 10*3/uL (ref 0.0–0.2)
Basos: 1 %
EOS (ABSOLUTE): 0.1 10*3/uL (ref 0.0–0.4)
Eos: 2 %
Hematocrit: 41.8 % (ref 34.0–46.6)
Hemoglobin: 12.6 g/dL (ref 11.1–15.9)
Immature Grans (Abs): 0 10*3/uL (ref 0.0–0.1)
Immature Granulocytes: 0 %
Lymphocytes Absolute: 4.2 10*3/uL — ABNORMAL HIGH (ref 0.7–3.1)
Lymphs: 54 %
MCH: 25.5 pg — ABNORMAL LOW (ref 26.6–33.0)
MCHC: 30.1 g/dL — ABNORMAL LOW (ref 31.5–35.7)
MCV: 85 fL (ref 79–97)
Monocytes Absolute: 0.5 10*3/uL (ref 0.1–0.9)
Monocytes: 6 %
Neutrophils Absolute: 2.9 10*3/uL (ref 1.4–7.0)
Neutrophils: 37 %
Platelets: 332 10*3/uL (ref 150–450)
RBC: 4.94 x10E6/uL (ref 3.77–5.28)
RDW: 14.8 % (ref 11.7–15.4)
WBC: 7.8 10*3/uL (ref 3.4–10.8)

## 2022-03-11 LAB — COMPREHENSIVE METABOLIC PANEL
ALT: 16 IU/L (ref 0–32)
AST: 22 IU/L (ref 0–40)
Albumin/Globulin Ratio: 0.8 — ABNORMAL LOW (ref 1.2–2.2)
Albumin: 3.4 g/dL — ABNORMAL LOW (ref 3.8–4.9)
Alkaline Phosphatase: 94 IU/L (ref 44–121)
BUN/Creatinine Ratio: 10 (ref 9–23)
BUN: 5 mg/dL — ABNORMAL LOW (ref 6–24)
Bilirubin Total: 0.4 mg/dL (ref 0.0–1.2)
CO2: 21 mmol/L (ref 20–29)
Calcium: 10.1 mg/dL (ref 8.7–10.2)
Chloride: 104 mmol/L (ref 96–106)
Creatinine, Ser: 0.51 mg/dL — ABNORMAL LOW (ref 0.57–1.00)
Globulin, Total: 4.2 g/dL (ref 1.5–4.5)
Glucose: 130 mg/dL — ABNORMAL HIGH (ref 70–99)
Potassium: 4 mmol/L (ref 3.5–5.2)
Sodium: 141 mmol/L (ref 134–144)
Total Protein: 7.6 g/dL (ref 6.0–8.5)
eGFR: 113 mL/min/{1.73_m2} (ref >59)

## 2022-03-11 LAB — THYROID PANEL WITH TSH
Free Thyroxine Index: 1.5 (ref 1.2–4.9)
T3 Uptake Ratio: 19 % — ABNORMAL LOW (ref 24–39)
T4, Total: 8 ug/dL (ref 4.5–12.0)
TSH: 1.64 u[IU]/mL (ref 0.450–4.500)

## 2022-03-16 ENCOUNTER — Other Ambulatory Visit (HOSPITAL_COMMUNITY): Payer: Self-pay

## 2022-03-16 ENCOUNTER — Other Ambulatory Visit: Payer: Self-pay

## 2022-03-18 ENCOUNTER — Other Ambulatory Visit (HOSPITAL_COMMUNITY): Payer: Self-pay

## 2022-03-25 ENCOUNTER — Other Ambulatory Visit (HOSPITAL_COMMUNITY): Payer: Self-pay

## 2022-03-31 NOTE — Progress Notes (Deleted)
Cardiology Office Note:    Date:  03/31/2022   ID:  Kaylee [name], DOB 1971-07-10, MRN [id number]  PCP:  Elsie Stain, MD  Cardiologist:  Evalina Field, MD  Electrophysiologist:  None   Referring MD: Elsie Stain, MD   Chief Complaint: follow-up of CHF and atypical chest pain  History of Present Illness:    Kaylee [name] is a 51 y.o. female with a history of atypical chest pain, chronic diastolic CHF, inappropriate sinus tachycardia, lymphedema, hypertension, hyperlipidemia, type 2 diabetes mellitus, and morbid obesity who is followed by Dr. Audie Box and presents today for routine follow-up.  Patient was referred to Dr. Audie Box in 09/2019 for further evaluation of dyspnea on exertion. Prior Echo in 2020 during a hospitalization for acute diastolic CHF showed LVEF of 60-65% with normal diastolic parameters. At that initial visit with Dr. Audie Box, symptoms were felt to be due to likely diastolic dysfunction vs morbid obesity. HCTZ was stopped and she was started on Lasix. Lisinopril was increased and she was started on Coreg; however, she was unable to tolerate Lisinopril due to a cough. She has a history of atypical chest pain but has not been felt to be a good candidate for a nuclear stress test due to her body habitus or a coronary CTA due to her baseline tachycardia. Echo was ordered and showed LVEF of 70-75% with normal wall motion and indeterminate diastolic parameters.   She was last seen by Dr. Audie Box in 03/2021  at which time she continued to report intermittent episodes of sharp central chest pain that would last seconds at a time. This was felt to be non-cardiac in nature and no further evaluation was felt to be needed. Sinus tachycardia was felt to be secondary to morbid obesity. Weight loss was recommended. Sleep study was also recommended given chronic fatigue and snores.   Patient was recently admitted from 02/06/2022 to 02/11/2022 with sepsis secondary to acute  diverticulitis. General surgery was consulted due to concerns for involvement of the left adnexa bladder and ureter area. She was treated with IV fluids and IV antibiotics and then transitioned to PO antibiotics.   Patient presents today for follow-up. ***  Atypical Chest Pain Patient has a history of atypical chest pain. She has not been felt to be a good candidate for a nuclear stress test due to her obesity or a coronary CTA due to her baseline tachycardia. However, chest pain suspected to be non-cardiac in nature.  - ***  Chronic Diastolic CHF Last Echo in 01/2021 showed LVEF of 70-75% with no regional wall motion abnormalities and indeterminate diastolic parameters as well as normal RV and no significant valvular disease. - Euvolemic on exam. - Continue Lasix 20mg  daily.  - Most of her symptoms felt to be related to morbid obesity as well as venous insufficiency and lymphedema.  Lymphedema Felt to have early stages of lymphedema.  - Continue Lasix 20mg  daily. - Recommended compressions stockings and elevating legs as much as possible.  Inappropriate Sinus Tachycardia Patient has chronic sinus tachycardia which has been felt to be secondary to her morbid obesity. Echo and TSH have been normal in the past. - ***  Hypertension BP well controlled. - Continue Losartan 50mg  daily and Lopressor 75mg  twice daily.  Hyperlipidemia Most recent lipid panel in *** - Continue Lipitor 40mg  daily.   Type 2 Diabetes Mellitus Hemoglobin A1c 7.1 in 02/2022. - On Glipizide and Farxiga. - Management per PCP.  Morbid Obesity  BMI *** -  Previously referred to [facility].   Snoring  Chronic Fatigue ***   Past Medical History:  Diagnosis Date   CHF (congestive heart failure) (HCC)    Chronic systolic CHF (congestive heart failure) (HCC) 02/06/2022   Diabetes mellitus without complication (HCC)    Essential hypertension 11/30/2020   Heavy menstrual period     Hypercalcemia 02/06/2022   Hypertension    Lymphedema    Mixed diabetic hyperlipidemia associated with type 2 diabetes mellitus (HCC) 02/06/2022   Morbid obesity (HCC)    Obesity, morbid, BMI 50 or higher (HCC) 11/30/2020   Sciatica    Type 2 diabetes mellitus with hyperglycemia, without long-term current use of insulin (HCC) 11/30/2020    Past Surgical History:  Procedure Laterality Date   s/p of leep cervix     TUBAL LIGATION      Current Medications: No outpatient medications have been marked as taking for the 04/13/22 encounter (Appointment) with Corrin Parker, PA-C.     Allergies:   Patient has no known allergies.   Social History   Socioeconomic History   Marital status: Divorced    Spouse name: Not on file   Number of children: 4   Years of education: Not on file   Highest education level: 12th grade  Occupational History   Not on file  Tobacco Use   Smoking status: Never   Smokeless tobacco: Never  Vaping Use   Vaping Use: Never used  Substance and Sexual Activity   Alcohol use: Not Currently   Drug use: Not Currently   Sexual activity: Not Currently  Other Topics Concern   Not on file  Social History Narrative   Not on file   Social Determinants of Health   Financial Resource Strain: Not on file  Food Insecurity: Not on file  Transportation Needs: Unmet Transportation Needs (04/16/2019)   PRAPARE - Administrator, Civil Service (Medical): Yes    Lack of Transportation (Non-Medical): Yes  Physical Activity: Not on file  Stress: Not on file  Social Connections: Not on file     Family History: The patient's family history includes Breast cancer (age of onset: 80) in her maternal grandmother; Leukemia in her mother.  ROS:   Please see the history of present illness.     EKGs/Labs/Other Studies Reviewed:    The following studies were reviewed today:  Echocardiogram 01/22/2021: Impressions:  1. Left ventricular ejection fraction, by  estimation, is 70 to 75%. The  left ventricle has hyperdynamic function. The left ventricle has no  regional wall motion abnormalities. Left ventricular diastolic parameters  are indeterminate.   2. Right ventricular systolic function is normal. The right ventricular  size is normal.   3. The mitral valve is grossly normal. No evidence of mitral valve  regurgitation. No evidence of mitral stenosis.   4. The aortic valve was not well visualized. Aortic valve regurgitation  is not visualized. No aortic stenosis is present.   5. The inferior vena cava is dilated in size with >50% respiratory  variability, suggesting right atrial pressure of 8 mmHg.   Comparison(s): A prior study was performed on 10/13/2018. Prior images  reviewed side by side. LV is more dynamic, though seen at higher rates  (contrasted images at HR 130s).   EKG:  EKG ordered today. EKG personally reviewed and demonstrates ***.  Recent Labs: 02/09/2022: Magnesium 1.8 03/10/2022: ALT 16; BUN 5; Creatinine, Ser 0.51; Hemoglobin 12.6; Platelets 332; Potassium 4.0; Sodium  141; TSH 1.640  Recent Lipid Panel    Component Value Date/Time   CHOL 142 06/01/2021 1125   TRIG 112 06/01/2021 1125   HDL 32 (L) 06/01/2021 1125   CHOLHDL 4.4 06/01/2021 1125   CHOLHDL 5.1 10/13/2018 0311   VLDL 19 10/13/2018 0311   LDLCALC 89 06/01/2021 1125    Physical Exam:    Vital Signs: There were no vitals taken for this visit.    Wt Readings from Last 3 Encounters:  03/10/22 (!) 427 lb (193.7 kg)  02/11/22 (!) 430 lb 9.6 oz (195.3 kg)  08/06/21 (!) 400 lb (181.4 kg)     General: 51 y.o. female in no acute distress. HEENT: Normocephalic and atraumatic. Sclera clear. EOMs intact. Neck: Supple. No carotid bruits. No JVD. Heart: *** RRR. Distinct S1 and S2. No murmurs, gallops, or rubs. Radial and distal pedal pulses 2+ and equal bilaterally. Lungs: No increased work of breathing. Clear to ausculation bilaterally. No wheezes, rhonchi, or  rales.  Abdomen: Soft, non-distended, and non-tender to palpation. Bowel sounds present in all 4 quadrants.  MSK: Normal strength and tone for age. *** Extremities: No lower extremity edema.    Skin: Warm and dry. Neuro: Alert and oriented x3. No focal deficits. Psych: Normal affect. Responds appropriately.   Assessment:    No diagnosis found.  Plan:     Disposition: Follow up in ***   Medication Adjustments/Labs and Tests Ordered: Current medicines are reviewed at length with the patient today.  Concerns regarding medicines are outlined above.  No orders of the defined types were placed in this encounter.  No orders of the defined types were placed in this encounter.   There are no Patient Instructions on file for this visit.   Signed, Corrin Parker, PA-C  03/31/2022 11:29 AM    [facility]

## 2022-04-12 ENCOUNTER — Inpatient Hospital Stay: Payer: 59 | Admitting: Critical Care Medicine

## 2022-04-13 ENCOUNTER — Ambulatory Visit: Payer: 59 | Admitting: Student

## 2022-04-29 ENCOUNTER — Other Ambulatory Visit (HOSPITAL_COMMUNITY): Payer: Self-pay

## 2022-04-30 ENCOUNTER — Other Ambulatory Visit (HOSPITAL_COMMUNITY): Payer: Self-pay

## 2022-05-03 ENCOUNTER — Other Ambulatory Visit (HOSPITAL_COMMUNITY): Payer: Self-pay

## 2022-05-05 ENCOUNTER — Other Ambulatory Visit (HOSPITAL_COMMUNITY): Payer: Self-pay

## 2022-05-09 NOTE — Progress Notes (Deleted)
Cardiology Office Note:    Date:  05/09/2022   ID:  Kaylee [name], DOB [birth year], MRN [id number]  PCP:  Kaylee Frisk, MD  Cardiologist:  Kaylee Harps, MD  Electrophysiologist:  None   Referring MD: Kaylee Frisk, MD   Chief Complaint: routine follow-up of CHF and inappropriate sinus tachycardia  History of Present Illness:    Kaylee [name] is a 51 y.o. female with a history of atypical chest pain, chronic diastolic CHF, inappropriate sinus tachycardia, lymphedema, hypertension, hyperlipidemia, type 2 diabetes mellitus, and morbid obesity who is followed by Dr. Flora [name] and presents today for routine follow-up.  Patient was referred to Dr. Flora [name] in 09/2019 for further evaluation of dyspnea on exertion. Prior Echo in 2020 during a hospitalization for acute diastolic CHF showed LVEF of 60-65% with normal diastolic parameters. At that initial visit with Dr. Flora [name], symptoms were felt to be due to likely diastolic dysfunction vs morbid obesity. HCTZ was stopped and she was started on Lasix. Lisinopril was increased and she was started on Coreg; however, she was unable to tolerate Lisinopril due to a cough. She has a history of atypical chest pain but has not been felt to be a good candidate for a nuclear stress test due to her body habitus or a coronary CTA due to her baseline tachycardia. Echo was ordered and showed LVEF of 70-75% with normal wall motion and indeterminate diastolic parameters.   She was last seen by Dr. Flora [name] in 03/2021  at which time she continued to report intermittent episodes of sharp central chest pain that would last seconds at a time. This was felt to be non-cardiac in nature and no further evaluation was felt to be needed. Sinus tachycardia was felt to be secondary to morbid obesity. Weight loss was recommended. Sleep study was also recommended given chronic fatigue and snores.   Patient was recently admitted from 02/06/2022 to 02/11/2022 with sepsis secondary to  acute diverticulitis. General surgery was consulted due to concerns for involvement of the left adnexa bladder and ureter area. She was treated with IV fluids and IV antibiotics and then transitioned to PO antibiotics.   Patient presents today for follow-up. ***  Atypical Chest Pain Patient has a history of atypical chest pain. She has not been felt to be a good candidate for a nuclear stress test due to her obesity or a coronary CTA due to her baseline tachycardia. However, chest pain suspected to be non-cardiac in nature.  - ***  Chronic Diastolic CHF Last Echo in 01/2021 showed LVEF of 70-75% with no regional wall motion abnormalities and indeterminate diastolic parameters as well as normal RV and no significant valvular disease. - Euvolemic on exam. - Continue Lasix 20mg  daily.  - Most of her symptoms felt to be related to morbid obesity as well as venous insufficiency and lymphedema.  Lymphedema Felt to have early stages of lymphedema.  - Continue Lasix 20mg  daily. - Recommended compressions stockings and elevating legs as much as possible.  Inappropriate Sinus Tachycardia Patient has chronic sinus tachycardia which has been felt to be secondary to her morbid obesity. Echo and TSH have been normal in the past. - ***  Hypertension BP well controlled. - Continue Losartan 50mg  daily and Lopressor 75mg  twice daily.  Hyperlipidemia Most recent lipid panel in *** - Continue Lipitor 40mg  daily.   Type 2 Diabetes Mellitus Hemoglobin A1c 7.1 in 02/2022. - On Glipizide and Farxiga. - Management per PCP.  Morbid Obesity  BMI *** -  Previously referred to [facility].   Snoring  Chronic Fatigue ***  Past Medical History:  Diagnosis Date   CHF (congestive heart failure) (HCC)    Chronic systolic CHF (congestive heart failure) (HCC) 02/06/2022   Diabetes mellitus without complication (HCC)    Essential hypertension 11/30/2020   Heavy menstrual period     Hypercalcemia 02/06/2022   Hypertension    Lymphedema    Mixed diabetic hyperlipidemia associated with type 2 diabetes mellitus (HCC) 02/06/2022   Morbid obesity (HCC)    Obesity, morbid, BMI 50 or higher (HCC) 11/30/2020   Sciatica    Type 2 diabetes mellitus with hyperglycemia, without long-term current use of insulin (HCC) 11/30/2020    Past Surgical History:  Procedure Laterality Date   s/p of leep cervix     TUBAL LIGATION      Current Medications: No outpatient medications have been marked as taking for the 05/19/22 encounter (Appointment) with Corrin Parker, PA-C.     Allergies:   Patient has no known allergies.   Social History   Socioeconomic History   Marital status: Divorced    Spouse name: Not on file   Number of children: 4   Years of education: Not on file   Highest education level: 12th grade  Occupational History   Not on file  Tobacco Use   Smoking status: Never   Smokeless tobacco: Never  Vaping Use   Vaping Use: Never used  Substance and Sexual Activity   Alcohol use: Not Currently   Drug use: Not Currently   Sexual activity: Not Currently  Other Topics Concern   Not on file  Social History Narrative   Not on file   Social Determinants of Health   Financial Resource Strain: Not on file  Food Insecurity: Not on file  Transportation Needs: Unmet Transportation Needs (04/16/2019)   PRAPARE - Administrator, Civil Service (Medical): Yes    Lack of Transportation (Non-Medical): Yes  Physical Activity: Not on file  Stress: Not on file  Social Connections: Not on file     Family History: The patient's family history includes Breast cancer (age of onset: 37) in her maternal grandmother; Leukemia in her mother.  ROS:   Please see the history of present illness.     EKGs/Labs/Other Studies Reviewed:    The following studies were reviewed today:  Echocardiogram 01/22/2021: Impressions:  1. Left ventricular ejection fraction, by  estimation, is 70 to 75%. The  left ventricle has hyperdynamic function. The left ventricle has no  regional wall motion abnormalities. Left ventricular diastolic parameters  are indeterminate.   2. Right ventricular systolic function is normal. The right ventricular  size is normal.   3. The mitral valve is grossly normal. No evidence of mitral valve  regurgitation. No evidence of mitral stenosis.   4. The aortic valve was not well visualized. Aortic valve regurgitation  is not visualized. No aortic stenosis is present.   5. The inferior vena cava is dilated in size with >50% respiratory  variability, suggesting right atrial pressure of 8 mmHg.    EKG:  EKG ordered today. EKG personally reviewed and demonstrates ***.  Recent Labs: 02/09/2022: Magnesium 1.8 03/10/2022: ALT 16; BUN 5; Creatinine, Ser 0.51; Hemoglobin 12.6; Platelets 332; Potassium 4.0; Sodium 141; TSH 1.640  Recent Lipid Panel    Component Value Date/Time   CHOL 142 06/01/2021 1125   TRIG 112 06/01/2021 1125   HDL 32 (L) 06/01/2021 1125  CHOLHDL 4.4 06/01/2021 1125   CHOLHDL 5.1 10/13/2018 0311   VLDL 19 10/13/2018 0311   LDLCALC 89 06/01/2021 1125    Physical Exam:    Vital Signs: There were no vitals taken for this visit.    Wt Readings from Last 3 Encounters:  03/10/22 (!) 427 lb (193.7 kg)  02/11/22 (!) 430 lb 9.6 oz (195.3 kg)  08/06/21 (!) 400 lb (181.4 kg)     General: 51 y.o. female in no acute distress. HEENT: Normocephalic and atraumatic. Sclera clear. EOMs intact. Neck: Supple. No carotid bruits. No JVD. Heart: *** RRR. Distinct S1 and S2. No murmurs, gallops, or rubs. Radial and distal pedal pulses 2+ and equal bilaterally. Lungs: No increased work of breathing. Clear to ausculation bilaterally. No wheezes, rhonchi, or rales.  Abdomen: Soft, non-distended, and non-tender to palpation. Bowel sounds present in all 4 quadrants.  MSK: Normal strength and tone for age. *** Extremities: No lower  extremity edema.    Skin: Warm and dry. Neuro: Alert and oriented x3. No focal deficits. Psych: Normal affect. Responds appropriately.   Assessment:    No diagnosis found.  Plan:     Disposition: Follow up in ***   Medication Adjustments/Labs and Tests Ordered: Current medicines are reviewed at length with the patient today.  Concerns regarding medicines are outlined above.  No orders of the defined types were placed in this encounter.  No orders of the defined types were placed in this encounter.   There are no Patient Instructions on file for this visit.   Signed, Corrin Parker, PA-C  05/09/2022 9:43 AM    [facility]

## 2022-05-13 ENCOUNTER — Other Ambulatory Visit (HOSPITAL_COMMUNITY): Payer: Self-pay

## 2022-05-14 ENCOUNTER — Other Ambulatory Visit (HOSPITAL_COMMUNITY): Payer: Self-pay

## 2022-05-16 ENCOUNTER — Other Ambulatory Visit (HOSPITAL_COMMUNITY): Payer: Self-pay

## 2022-05-19 ENCOUNTER — Ambulatory Visit: Payer: 59 | Admitting: Student

## 2022-07-09 NOTE — Progress Notes (Deleted)
Cardiology Office Note:    Date:  07/09/2022   ID:  Silas Sedam, DOB 04/14/1971, MRN [id number]  PCP:  Storm Frisk, MD  Cardiologist:  Reatha Harps, MD  Electrophysiologist:  None   Referring MD: Storm Frisk, MD   Chief Complaint: follow-up of atypical chest pain and CHF  History of Present Illness:    Kaylee [name] is a 51 y.o. female with a history of atypical chest pain, chronic diastolic CHF, inappropriate sinus tachycardia, lymphedema, hypertension, hyperlipidemia, type 2 diabetes mellitus, and morbid obesity who is followed by Dr. Flora Lipps and presents today for routine follow-up.  Patient was referred to Dr. Flora Lipps in 09/2019 for further evaluation of dyspnea on exertion. Prior Echo in 2020 during a hospitalization for acute diastolic CHF showed LVEF of 60-65% with normal diastolic parameters. At that initial visit with Dr. Flora Lipps, symptoms were felt to be due to likely diastolic dysfunction vs morbid obesity. HCTZ was stopped and she was started on Lasix. Lisinopril was increased and she was started on Coreg; however, she was unable to tolerate Lisinopril due to a cough. She has a history of atypical chest pain but has not been felt to be a good candidate for a nuclear stress test due to her body habitus or a coronary CTA due to her baseline tachycardia. Echo was ordered and showed LVEF of 70-75% with normal wall motion and indeterminate diastolic parameters.   She was last seen by Dr. Flora Lipps in 03/2021  at which time she continued to report intermittent episodes of sharp central chest pain that would last seconds at a time. This was felt to be non-cardiac in nature and no further evaluation was felt to be needed. Sinus tachycardia was felt to be secondary to morbid obesity. Weight loss was recommended. Sleep study was also recommended given chronic fatigue and snores.   Patient was recently admitted from 02/06/2022 to 02/11/2022 with sepsis secondary to acute  diverticulitis. General surgery was consulted due to concerns for involvement of the left adnexa bladder and ureter area. She was treated with IV fluids and IV antibiotics and then transitioned to PO antibiotics.   Patient presents today for follow-up. ***  Atypical Chest Pain Patient has a history of atypical chest pain. She has not been felt to be a good candidate for a nuclear stress test due to her obesity or a coronary CTA due to her baseline tachycardia. However, chest pain suspected to be non-cardiac in nature.  - ***  Chronic Diastolic CHF Last Echo in 01/2021 showed LVEF of 70-75% with no regional wall motion abnormalities and indeterminate diastolic parameters as well as normal RV and no significant valvular disease. - Euvolemic on exam. - Continue Lasix 20mg  daily.  - Most of her symptoms felt to be related to morbid obesity as well as venous insufficiency and lymphedema.  Lymphedema Felt to have early stages of lymphedema.  - Continue Lasix 20mg  daily. - Recommended compressions stockings and elevating legs as much as possible.  Inappropriate Sinus Tachycardia Patient has chronic sinus tachycardia which has been felt to be secondary to her morbid obesity. Echo and TSH have been normal in the past. - ***  Hypertension BP well controlled. - Continue Losartan 50mg  daily and Lopressor 75mg  twice daily.  Hyperlipidemia Most recent lipid panel in *** - Continue Lipitor 40mg  daily.   Type 2 Diabetes Mellitus Hemoglobin A1c 7.1 in 02/2022. - On Glipizide and Farxiga. - Management per PCP.  Morbid Obesity  BMI *** -  Previously referred to [facility].   Snoring  Chronic Fatigue ***  Past Medical History:  Diagnosis Date   CHF (congestive heart failure) (HCC)    Chronic systolic CHF (congestive heart failure) (HCC) 02/06/2022   Diabetes mellitus without complication (HCC)    Essential hypertension 11/30/2020   Heavy menstrual period     Hypercalcemia 02/06/2022   Hypertension    Lymphedema    Mixed diabetic hyperlipidemia associated with type 2 diabetes mellitus (HCC) 02/06/2022   Morbid obesity (HCC)    Obesity, morbid, BMI 50 or higher (HCC) 11/30/2020   Sciatica    Type 2 diabetes mellitus with hyperglycemia, without long-term current use of insulin (HCC) 11/30/2020    Past Surgical History:  Procedure Laterality Date   s/p of leep cervix     TUBAL LIGATION      Current Medications: No outpatient medications have been marked as taking for the 07/13/22 encounter (Appointment) with Corrin Parker, PA-C.     Allergies:   Patient has no known allergies.   Social History   Socioeconomic History   Marital status: Divorced    Spouse name: Not on file   Number of children: 4   Years of education: Not on file   Highest education level: 12th grade  Occupational History   Not on file  Tobacco Use   Smoking status: Never   Smokeless tobacco: Never  Vaping Use   Vaping Use: Never used  Substance and Sexual Activity   Alcohol use: Not Currently   Drug use: Not Currently   Sexual activity: Not Currently  Other Topics Concern   Not on file  Social History Narrative   Not on file   Social Determinants of Health   Financial Resource Strain: Not on file  Food Insecurity: Not on file  Transportation Needs: Unmet Transportation Needs (04/16/2019)   PRAPARE - Administrator, Civil Service (Medical): Yes    Lack of Transportation (Non-Medical): Yes  Physical Activity: Not on file  Stress: Not on file  Social Connections: Not on file     Family History: The patient's family history includes Breast cancer (age of onset: 53) in her maternal grandmother; Leukemia in her mother.  ROS:   Please see the history of present illness.     EKGs/Labs/Other Studies Reviewed:    The following studies were reviewed today:  Echocardiogram 01/22/2021: Impressions:  1. Left ventricular ejection fraction, by  estimation, is 70 to 75%. The  left ventricle has hyperdynamic function. The left ventricle has no  regional wall motion abnormalities. Left ventricular diastolic parameters  are indeterminate.   2. Right ventricular systolic function is normal. The right ventricular  size is normal.   3. The mitral valve is grossly normal. No evidence of mitral valve  regurgitation. No evidence of mitral stenosis.   4. The aortic valve was not well visualized. Aortic valve regurgitation  is not visualized. No aortic stenosis is present.   5. The inferior vena cava is dilated in size with >50% respiratory  variability, suggesting right atrial pressure of 8 mmHg.   Comparison(s): A prior study was performed on 10/13/2018. Prior images  reviewed side by side. LV is more dynamic, though seen at higher rates  (contrasted images at HR 130s).    EKG:  EKG ordered today. EKG personally reviewed and demonstrates ***.  Recent Labs: 02/09/2022: Magnesium 1.8 03/10/2022: ALT 16; BUN 5; Creatinine, Ser 0.51; Hemoglobin 12.6; Platelets 332; Potassium 4.0; Sodium  141; TSH 1.640  Recent Lipid Panel    Component Value Date/Time   CHOL 142 06/01/2021 1125   TRIG 112 06/01/2021 1125   HDL 32 (L) 06/01/2021 1125   CHOLHDL 4.4 06/01/2021 1125   CHOLHDL 5.1 10/13/2018 0311   VLDL 19 10/13/2018 0311   LDLCALC 89 06/01/2021 1125    Physical Exam:    Vital Signs: There were no vitals taken for this visit.    Wt Readings from Last 3 Encounters:  03/10/22 (!) 427 lb (193.7 kg)  02/11/22 (!) 430 lb 9.6 oz (195.3 kg)  08/06/21 (!) 400 lb (181.4 kg)     General: 51 y.o. female in no acute distress. HEENT: Normocephalic and atraumatic. Sclera clear. EOMs intact. Neck: Supple. No carotid bruits. No JVD. Heart: *** RRR. Distinct S1 and S2. No murmurs, gallops, or rubs. Radial and distal pedal pulses 2+ and equal bilaterally. Lungs: No increased work of breathing. Clear to ausculation bilaterally. No wheezes, rhonchi, or  rales.  Abdomen: Soft, non-distended, and non-tender to palpation. Bowel sounds present in all 4 quadrants.  MSK: Normal strength and tone for age. *** Extremities: No lower extremity edema.    Skin: Warm and dry. Neuro: Alert and oriented x3. No focal deficits. Psych: Normal affect. Responds appropriately.   Assessment:    No diagnosis found.  Plan:     Disposition: Follow up in ***   Medication Adjustments/Labs and Tests Ordered: Current medicines are reviewed at length with the patient today.  Concerns regarding medicines are outlined above.  No orders of the defined types were placed in this encounter.  No orders of the defined types were placed in this encounter.   There are no Patient Instructions on file for this visit.   Signed, Darreld Mclean, PA-C  07/09/2022 5:43 PM    [facility]

## 2022-07-12 ENCOUNTER — Encounter: Payer: Self-pay | Admitting: Student

## 2022-07-13 ENCOUNTER — Ambulatory Visit: Payer: Medicaid Other | Attending: Student | Admitting: Student

## 2022-07-13 DIAGNOSIS — E785 Hyperlipidemia, unspecified: Secondary | ICD-10-CM

## 2022-07-18 ENCOUNTER — Encounter: Payer: Self-pay | Admitting: Student

## 2022-10-24 ENCOUNTER — Ambulatory Visit (HOSPITAL_COMMUNITY): Payer: Self-pay

## 2022-10-24 ENCOUNTER — Encounter (HOSPITAL_COMMUNITY): Payer: Self-pay

## 2022-10-24 ENCOUNTER — Other Ambulatory Visit: Payer: Self-pay

## 2022-10-24 ENCOUNTER — Ambulatory Visit (HOSPITAL_COMMUNITY)
Admission: RE | Admit: 2022-10-24 | Discharge: 2022-10-24 | Disposition: A | Discharge status: Home / Self Care | Payer: BLUE CROSS/BLUE SHIELD | Source: Ambulatory Visit | Attending: Emergency Medicine | Admitting: Emergency Medicine

## 2022-10-24 VITALS — BP 139/91 | HR 107 | Temp 98.4°F | Resp 20

## 2022-10-24 DIAGNOSIS — N3001 Acute cystitis with hematuria: Secondary | ICD-10-CM | POA: Insufficient documentation

## 2022-10-24 LAB — POCT URINALYSIS DIPSTICK, ED / UC
Bilirubin Urine: NEGATIVE
Glucose, UA: NEGATIVE mg/dL
Ketones, ur: NEGATIVE mg/dL
Nitrite: POSITIVE — AB
Protein, ur: 100 mg/dL — AB
Specific Gravity, Urine: 1.025 (ref 1.005–1.030)
Urobilinogen, UA: 0.2 mg/dL (ref 0.0–1.0)
pH: 6 (ref 5.0–8.0)

## 2022-10-24 MED ORDER — CEPHALEXIN 500 MG PO CAPS: 500.0000 mg | ORAL_CAPSULE | Freq: Two times a day (BID) | ORAL | 0 refills | Status: AC

## 2022-10-24 MED ORDER — ONDANSETRON 4 MG PO TBDP
4.0000 mg | ORAL_TABLET | Freq: Once | ORAL | Status: AC
  Administered 2022-10-24: 4 mg via ORAL

## 2022-10-24 MED ORDER — ONDANSETRON 4 MG PO TBDP
ORAL_TABLET | ORAL | Status: AC
  Filled 2022-10-24: qty 1

## 2022-10-24 MED ORDER — ONDANSETRON 4 MG PO TBDP: 4.0000 mg | ORAL_TABLET | Freq: Four times a day (QID) | ORAL | 0 refills | Status: AC | PRN

## 2022-10-24 NOTE — Discharge Instructions (Addendum)
Please take antibiotic as prescribed. I recommend to eat before taking. You can use the zofran 30 minutes before eating to settle the stomach. Drink lots of fluids!  Please go to the emergency department if symptoms worsen. Especially if you cannot tolerate fluids by mouth or your pain worsens.

## 2022-10-24 NOTE — ED Triage Notes (Signed)
Pt reports 4 days of urinary frequency and dysuria with ABD pain.

## 2022-10-24 NOTE — ED Provider Notes (Signed)
[place]    CSN: [id number] Arrival date & time: 10/24/22  1610      History   Chief Complaint Chief Complaint  Patient presents with   Urinary Frequency   Dysuria   Abdominal Pain    HPI Kaylee [name] is a 52 y.o. female.  Presents with 4 day history of dysuria, frequency, suprapubic pain. Pain 6/10 today Reports nausea, vomiting after eating or drinking No fevers or chills. Denies flank pain Normal BM, last was this morning No vaginal symptoms  In June she was seen for possible pyelo, at first treated outpt with abx but was later admitted to hospital for urosepsis Reports feeling similar symptoms this time.  Past Medical History:  Diagnosis Date   Chronic diastolic CHF (congestive heart failure) ([place]) 02/06/2022   Essential hypertension 11/30/2020   Heavy menstrual period    Hypercalcemia 02/06/2022   Hyperlipidemia 02/06/2022   Lymphedema    Morbid obesity ([place])    Obesity, morbid, BMI 50 or higher ([place]) 11/30/2020   Sciatica    Type 2 diabetes mellitus ([place]) 11/30/2020    Patient Active Problem List   Diagnosis Date Noted   Hyperlipidemia 02/06/2022   Obesity, morbid, BMI 50 or higher ([place]) 11/30/2020   Moderate persistent asthma without complication [id number]   Essential hypertension 11/30/2020   History of anemia 11/30/2020   Type 2 diabetes mellitus ([place]) 11/30/2020   Primary osteoarthritis of both knees 01/17/2019   Menorrhagia with regular cycle 10/13/2018    Past Surgical History:  Procedure Laterality Date   s/p of leep cervix     TUBAL LIGATION      OB History     Gravida  4   Para      Term      Preterm      AB      Living  4      SAB      IAB      Ectopic      Multiple      Live Births  4            Home Medications    Prior to Admission medications   Medication Sig Start Date End Date Taking? Authorizing Provider  cephALEXin (KEFLEX) 500 MG capsule Take 1 capsule (500 mg total) by mouth 2  (two) times daily for 7 days. 10/24/22 10/31/22 Yes Shery [place], Wells Guiles, PA-C  ondansetron (ZOFRAN-ODT) 4 MG disintegrating tablet Take 1 tablet (4 mg total) by mouth every 6 (six) hours as needed. 10/24/22  Yes Karol Skarzynski, Wells Guiles, PA-C  albuterol (VENTOLIN HFA) 108 (90 Base) MCG/ACT inhaler Inhale 2 puffs into the lungs every 6 (six) hours as needed for wheezing or shortness of breath. 05/26/21   Elsie Stain, MD  atorvastatin (LIPITOR) 40 MG tablet TAKE 1 TABLET (40 MG TOTAL) BY MOUTH DAILY. 03/10/22 03/10/23  Argentina Donovan, PA-C  Blood Glucose Monitoring Suppl (TRUE METRIX METER) w/Device KIT Use to check blood sugar TID. 02/14/20   Fulp, Cammie, MD  Cholecalciferol 25 MCG (1000 UT) tablet Take 1 tablet (1,000 Units total) by mouth once daily. 03/22/22   Mariel Aloe, MD  dapagliflozin propanediol (FARXIGA) 10 MG TABS tablet Take 1 tablet (10 mg total) by mouth daily before breakfast. 03/10/22   Argentina Donovan, PA-C  ferrous sulfate 325 (65 FE) MG tablet TAKE 1 TABLET (325 MG TOTAL) BY MOUTH 2 (TWO) TIMES DAILY WITH A MEAL. MUST HAVE OFFICE VISIT FOR REFILLS 03/10/22 03/10/23  Thereasa Solo,  Dionne Bucy, PA-C  fluticasone-salmeterol (ADVAIR) 250-50 MCG/ACT AEPB Inhale 1 puff into the lungs 2 (two) times daily. 03/10/22   Argentina Donovan, PA-C  furosemide (LASIX) 20 MG tablet TAKE 1 TABLET (20 MG TOTAL) BY MOUTH DAILY. 03/10/22 03/10/23  Argentina Donovan, PA-C  glipiZIDE (GLUCOTROL) 5 MG tablet Take 1 tablet (5 mg total) by mouth 2 (two) times daily before a meal. 02/16/22 02/16/23  Mariel Aloe, MD  glucose blood (TRUE METRIX BLOOD GLUCOSE TEST) test strip Use to check blood sugar 3 times daily. 06/01/21   Elsie Stain, MD  losartan (COZAAR) 50 MG tablet TAKE 1 TABLET (50 MG TOTAL) BY MOUTH DAILY. 03/10/22 03/10/23  Argentina Donovan, PA-C  metoprolol tartrate (LOPRESSOR) 25 MG tablet Take 3 tablets (75 mg total) by mouth 2 (two) times daily. 03/10/22 06/13/22  Argentina Donovan, PA-C  montelukast (SINGULAIR) 10 MG  tablet TAKE 1 TABLET BY MOUTH AT BEDTIME. 06/01/21 08/12/22  Elsie Stain, MD  potassium chloride (KLOR-CON) 10 MEQ tablet Take 1 tablet (10 mEq total) by mouth daily. 03/10/22 03/10/23  Argentina Donovan, PA-C  TRUEplus Lancets 28G MISC Use to check blood sugar 3 times daily. 06/01/21   Elsie Stain, MD    Family History Family History  Problem Relation Age of Onset   Leukemia Mother    Breast cancer Maternal Grandmother 87    Social History Social History   Tobacco Use   Smoking status: Never   Smokeless tobacco: Never  Vaping Use   Vaping Use: Never used  Substance Use Topics   Alcohol use: Not Currently   Drug use: Not Currently     Allergies   Patient has no known allergies.   Review of Systems Review of Systems As per HPI  Physical Exam Triage Vital Signs ED Triage Vitals  Enc Vitals Group     BP 10/24/22 1647 (!) 151/105     Pulse Rate 10/24/22 1647 (!) 119     Resp 10/24/22 1647 20     Temp 10/24/22 1647 98.6 F (37 C)     Temp src --      SpO2 10/24/22 1647 95 %     Weight --      Height --      Head Circumference --      Peak Flow --      Pain Score 10/24/22 1644 6     Pain Loc --      Pain Edu? --      Excl. in [place]? --    No data found.  Updated Vital Signs BP (!) 139/91   Pulse (!) 107   Temp 98.4 F (36.9 C)   Resp 20   LMP  (LMP Unknown)   SpO2 94%   Physical Exam Vitals and nursing note reviewed.  Constitutional:      General: She is not in acute distress. HENT:     Mouth/Throat:     Mouth: Mucous membranes are moist.     Pharynx: Oropharynx is clear.  Eyes:     Conjunctiva/sclera: Conjunctivae normal.     Pupils: Pupils are equal, round, and reactive to light.  Cardiovascular:     Rate and Rhythm: Regular rhythm. Tachycardia present.     Heart sounds: Normal heart sounds.     Comments: Pt reports resting HR is in the 100s Pulmonary:     Effort: Pulmonary effort is normal.     Breath sounds: Normal breath sounds.  Abdominal:     General: Bowel sounds are normal.     Palpations: Abdomen is soft.     Tenderness: There is no abdominal tenderness. There is no right CVA tenderness or left CVA tenderness.     Comments: Non tender but habitus limits exam  Neurological:     Mental Status: She is alert and oriented to person, place, and time.     UC Treatments / Results  Labs (all labs ordered are listed, but only abnormal results are displayed) Labs Reviewed  POCT URINALYSIS DIPSTICK, ED / UC - Abnormal; Notable for the following components:      Result Value   Hgb urine dipstick MODERATE (*)    Protein, ur 100 (*)    Nitrite POSITIVE (*)    Leukocytes,Ua SMALL (*)    All other components within normal limits  URINE CULTURE    EKG  Radiology No results found.  Procedures Procedures   Medications Ordered in UC Medications  ondansetron (ZOFRAN-ODT) disintegrating tablet 4 mg (4 mg Oral Given 10/24/22 1734)    Initial Impression / Assessment and Plan / UC Course  I have reviewed the triage vital signs and the nursing notes.  Pertinent labs & imaging results that were available during my care of the patient were reviewed by me and considered in my medical decision making (see chart for details).  UA with +nitrates, small leuks, moderate hgb Culture pending  Discussed attempting treatment outpatient vs evaluation in the ED given her similar symptoms to last admission. Patient would like to try medication in urgent care first, provider agreeable to plan as she is afebrile and overall well appearing  Zofran ODT given, p.o challenge successful. Nausea resolved.  Keflex BID x 7 days Strict ED precautions for any worsening symptoms including inability to tolerate p.o fluids. Pt agreeable to plan  Final Clinical Impressions(s) / UC Diagnoses   Final diagnoses:  Acute cystitis with hematuria     Discharge Instructions      Please take antibiotic as prescribed. I recommend to eat  before taking. You can use the zofran 30 minutes before eating to settle the stomach. Drink lots of fluids!  Please go to the emergency department if symptoms worsen. Especially if you cannot tolerate fluids by mouth or your pain worsens.      ED Prescriptions     Medication Sig Dispense Auth. Provider   ondansetron (ZOFRAN-ODT) 4 MG disintegrating tablet Take 1 tablet (4 mg total) by mouth every 6 (six) hours as needed. 20 tablet Socrates Cahoon, PA-C   cephALEXin (KEFLEX) 500 MG capsule Take 1 capsule (500 mg total) by mouth 2 (two) times daily for 7 days. 14 capsule Wende Longstre[place], Wells Guiles, PA-C      PDMP not reviewed this encounter.   Kyra Leyland 10/24/22 1902

## 2022-10-26 LAB — URINE CULTURE: Culture: >=100000 — AB

## 2022-11-22 ENCOUNTER — Telehealth: Payer: Self-pay

## 2022-11-22 NOTE — Progress Notes (Signed)
Patient attempted to be outreached by Georga Kaufmann, PharmD Candidate on 11/22/22 to discuss hypertension. Left voicemail for patient to return our call at their convenience at [phone].   Georga Kaufmann, PharmD Candidate    Maryan Puls, PharmD PGY-1 [facility] Pharmacy Resident

## 2022-11-24 NOTE — Progress Notes (Signed)
Patient outreached by Donney Rankins, PharmD Candidate on 11/24/2022 to discuss hypertension.    Patient has an automated home blood pressure machine.  She checks her blood pressure weekly using a wrist cuff.  She reports home readings as systolic [id number] and diastolic as [id number].  She denies any sx of dizziness or lightheadedness.  She forgets to take her medications weekly and when she does forget, she will have a headache.  As far as her diet, for breakfast she will eat eggs, [place] sausage, and toast, for lunch she will eat a chicken patty or [place] sausage, and for dinner she will eat rice, vegetables, and baked chicken or fish.  For a snack, she will have peanut butter crackers.  She also implements fruit (oranges, strawberries, bananas) into her diet and has good water intake.  She does chair exercises daily for 30 minutes.  She denies alcohol or drug use.     Medication review was performed. They are taking medications as prescribed.   The following barriers to adherence were noted:  - They do not have cost concerns.  - They do not have transportation concerns.  - They do not need assistance obtaining refills.  - They do occasionally forget to take some of their prescribed medications.  - They do not feel like one/some of their medications make them feel poorly.  - They do not have questions or concerns about their medications.  - They do have follow up scheduled with their primary care provider/cardiologist.   The following interventions were completed:  - Medications were reviewed  - Patient was counseled on lifestyle modifications to improve blood pressure, including exercise   The patient has follow up scheduled: 12/09/2022  PCP: [facility]  PharmD Candidate 2024   Maryan Puls, PharmD PGY-1 [facility] Pharmacy Resident

## 2022-12-09 ENCOUNTER — Encounter: Payer: Self-pay | Admitting: Internal Medicine

## 2022-12-09 ENCOUNTER — Ambulatory Visit: Payer: BLUE CROSS/BLUE SHIELD | Attending: Critical Care Medicine | Admitting: Internal Medicine

## 2022-12-09 VITALS — BP 128/78 | HR 109 | Temp 98.8°F | Ht 67.0 in | Wt >= 6400 oz

## 2022-12-09 DIAGNOSIS — I5030 Unspecified diastolic (congestive) heart failure: Secondary | ICD-10-CM | POA: Diagnosis not present

## 2022-12-09 DIAGNOSIS — I10 Essential (primary) hypertension: Secondary | ICD-10-CM

## 2022-12-09 DIAGNOSIS — H43393 Other vitreous opacities, bilateral: Secondary | ICD-10-CM

## 2022-12-09 DIAGNOSIS — G8929 Other chronic pain: Secondary | ICD-10-CM | POA: Diagnosis not present

## 2022-12-09 DIAGNOSIS — M5440 Lumbago with sciatica, unspecified side: Secondary | ICD-10-CM | POA: Diagnosis not present

## 2022-12-09 DIAGNOSIS — Z6841 Body Mass Index (BMI) 40.0 and over, adult: Secondary | ICD-10-CM

## 2022-12-09 MED ORDER — LOSARTAN POTASSIUM 50 MG PO TABS: 50.0000 mg | ORAL_TABLET | Freq: Every day | ORAL | 1 refills | Status: AC

## 2022-12-09 MED ORDER — MELOXICAM 15 MG PO TABS: 15.0000 mg | ORAL_TABLET | Freq: Every day | ORAL | 3 refills | Status: AC

## 2022-12-09 MED ORDER — GLIPIZIDE 5 MG PO TABS: 5.0000 mg | ORAL_TABLET | Freq: Two times a day (BID) | ORAL | 1 refills | Status: AC

## 2022-12-09 MED ORDER — FUROSEMIDE 20 MG PO TABS: ORAL_TABLET | Freq: Every day | ORAL | 1 refills | Status: AC

## 2022-12-09 MED ORDER — POTASSIUM CHLORIDE ER 10 MEQ PO TBCR: 10.0000 meq | EXTENDED_RELEASE_TABLET | Freq: Every day | ORAL | 0 refills | Status: AC

## 2022-12-09 MED ORDER — DAPAGLIFLOZIN PROPANEDIOL 10 MG PO TABS: 10.0000 mg | ORAL_TABLET | Freq: Every day | ORAL | 1 refills | Status: AC
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
## 2022-12-10 ENCOUNTER — Other Ambulatory Visit: Payer: Self-pay | Admitting: Internal Medicine

## 2022-12-10 LAB — BASIC METABOLIC PANEL
BUN/Creatinine Ratio: 12 (ref 9–23)
BUN: 6 mg/dL (ref 6–24)
CO2: 21 mmol/L (ref 20–29)
Calcium: 10.6 mg/dL — ABNORMAL HIGH (ref 8.7–10.2)
Chloride: 104 mmol/L (ref 96–106)
Creatinine, Ser: 0.51 mg/dL — ABNORMAL LOW (ref 0.57–1.00)
Glucose: 109 mg/dL — ABNORMAL HIGH (ref 70–99)
Potassium: 4.5 mmol/L (ref 3.5–5.2)
Sodium: 139 mmol/L (ref 134–144)
eGFR: 112 mL/min/{1.73_m2} (ref >59)

## 2022-12-12 ENCOUNTER — Ambulatory Visit
Admission: RE | Admit: 2022-12-12 | Discharge: 2022-12-12 | Disposition: A | Discharge status: Home / Self Care | Payer: BLUE CROSS/BLUE SHIELD | Source: Ambulatory Visit | Attending: Internal Medicine | Admitting: Internal Medicine

## 2022-12-12 ENCOUNTER — Ambulatory Visit: Payer: BLUE CROSS/BLUE SHIELD | Attending: Critical Care Medicine

## 2022-12-12 DIAGNOSIS — G8929 Other chronic pain: Secondary | ICD-10-CM

## 2022-12-13 ENCOUNTER — Encounter: Payer: Self-pay | Admitting: Internal Medicine

## 2022-12-13 LAB — PTH-RELATED PEPTIDE

## 2022-12-13 LAB — VITAMIN D 25 HYDROXY (VIT D DEFICIENCY, FRACTURES): Vit D, 25-Hydroxy: 10.1 ng/mL — ABNORMAL LOW (ref 30.0–100.0)

## 2022-12-13 LAB — PTH, INTACT AND CALCIUM: PTH: 71 pg/mL — ABNORMAL HIGH (ref 15–65)

## 2022-12-14 ENCOUNTER — Encounter: Payer: Self-pay | Admitting: Internal Medicine

## 2022-12-14 ENCOUNTER — Other Ambulatory Visit: Payer: Self-pay | Admitting: Internal Medicine

## 2022-12-14 ENCOUNTER — Ambulatory Visit: Payer: BLUE CROSS/BLUE SHIELD | Admitting: Critical Care Medicine

## 2022-12-14 MED ORDER — VITAMIN D (ERGOCALCIFEROL) 1.25 MG (50000 UNIT) PO CAPS: 50000.0000 [IU] | ORAL_CAPSULE | ORAL | 1 refills | Status: AC

## 2022-12-14 NOTE — Progress Notes (Signed)
I sent patient a MyChart note explaining labs: Your parathyroid hormone level is elevated.  The parathyroid glands in the neck control the way your body uses calcium by depositing it in the bones or removing it from the bones.  If these glands over function, calcium level can be elevated.  Low vitamin D level can also cause elevation of the hormone.  Your vitamin D level was low.  I would like to put you on high-dose vitamin D to take once a week.  I have sent the prescription to your pharmacy.  You would need to have your vitamin D level and parathyroid hormone levels checked when you see Dr. Delford Field in May.  Results for orders placed or performed in visit on 12/12/22  PTH, intact and calcium  Result Value Ref Range   PTH 71 (H) 15 - 65 pg/mL  PTH-related peptide  Result Value Ref Range   PTH-related peptide WILL FOLLOW   TSH  Result Value Ref Range   TSH 2.410 0.450 - 4.500 uIU/mL  VITAMIN D 25 Hydroxy (Vit-D Deficiency, Fractures)  Result Value Ref Range   Vit D, 25-Hydroxy 10.1 (L) 30.0 - 100.0 ng/mL

## 2022-12-17 LAB — PTH, INTACT AND CALCIUM: PTH: 71 pg/mL — ABNORMAL HIGH (ref 15–65)

## 2022-12-17 LAB — TSH: TSH: 2.41 u[IU]/mL (ref 0.450–4.500)

## 2022-12-26 ENCOUNTER — Ambulatory Visit: Payer: BLUE CROSS/BLUE SHIELD | Admitting: Physical Therapy
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
## 2023-01-24 ENCOUNTER — Ambulatory Visit: Payer: BLUE CROSS/BLUE SHIELD | Admitting: Critical Care Medicine

## 2023-05-02 ENCOUNTER — Telehealth: Payer: Self-pay

## 2023-05-05 ENCOUNTER — Other Ambulatory Visit: Payer: Self-pay

## 2023-05-15 ENCOUNTER — Other Ambulatory Visit: Payer: Self-pay

## 2023-05-15 ENCOUNTER — Ambulatory Visit: Payer: Medicaid Other | Attending: Physician Assistant

## 2023-05-15 DIAGNOSIS — M6281 Muscle weakness (generalized): Secondary | ICD-10-CM | POA: Diagnosis present

## 2023-05-15 DIAGNOSIS — M5459 Other low back pain: Secondary | ICD-10-CM | POA: Diagnosis present

## 2023-05-15 DIAGNOSIS — R2689 Other abnormalities of gait and mobility: Secondary | ICD-10-CM | POA: Insufficient documentation
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
## 2023-05-15 NOTE — Addendum Note (Signed)
Addended by: Eloy End on: 05/15/2023 11:48 AM   Modules accepted: Orders

## 2023-05-29 ENCOUNTER — Ambulatory Visit: Payer: Medicaid Other | Admitting: Physical Therapy

## 2023-05-31 ENCOUNTER — Ambulatory Visit: Payer: Medicaid Other

## 2023-05-31 DIAGNOSIS — M5459 Other low back pain: Secondary | ICD-10-CM | POA: Diagnosis not present

## 2023-05-31 DIAGNOSIS — M6281 Muscle weakness (generalized): Secondary | ICD-10-CM

## 2023-05-31 DIAGNOSIS — R2689 Other abnormalities of gait and mobility: Secondary | ICD-10-CM

## 2023-05-31 NOTE — Therapy (Signed)
OUTPATIENT PHYSICAL THERAPY TREATMENT   Patient Name: Kaylee [name] MRN: [id number] DOB:1971-04-06, 52 y.o., female Today's Date: 05/31/2023  END OF SESSION:  PT End of Session - 05/31/23 1038     Visit Number 2    Number of Visits 17    Date for PT Re-Evaluation 07/10/23    Authorization Type [employer]    Authorization Time Period Initial auth submitted    PT Start Time 1040    PT Stop Time 1120    PT Time Calculation (min) 40 min    Activity Tolerance Patient tolerated treatment well    Behavior During Therapy WFL for tasks assessed/performed              Past Medical History:  Diagnosis Date   Chronic diastolic CHF (congestive heart failure) (HCC) 02/06/2022   Essential hypertension 11/30/2020   Heavy menstrual period    Hypercalcemia 02/06/2022   Hyperlipidemia 02/06/2022   Lymphedema    Morbid obesity (HCC)    Obesity, morbid, BMI 50 or higher (HCC) 11/30/2020   Sciatica    Type 2 diabetes mellitus (HCC) 11/30/2020   Past Surgical History:  Procedure Laterality Date   s/p of leep cervix     TUBAL LIGATION     Patient Active Problem List   Diagnosis Date Noted   Hyperlipidemia 02/06/2022   Obesity, morbid, BMI 50 or higher (HCC) 11/30/2020   Moderate persistent asthma without complication 11/30/2020   Essential hypertension 11/30/2020   History of anemia 11/30/2020   Type 2 diabetes mellitus (HCC) 11/30/2020   Primary osteoarthritis of both knees 01/17/2019   Menorrhagia with regular cycle 10/13/2018    PCP: Storm Frisk, MD  REFERRING PROVIDER: Remus Loffler, PA-C   REFERRING DIAG: M54.50 (ICD-10-CM) - Low back pain, unspecified   Rationale for Evaluation and Treatment: Rehabilitation  THERAPY DIAG:  Other low back pain  Muscle weakness (generalized)  Other abnormalities of gait and mobility  ONSET DATE: Chronic  SUBJECTIVE:                                                                                                                                                                                            SUBJECTIVE STATEMENT: Pt presents to PT with reports of decreased back pain. Has been compliant with HEP with no adverse effect.   PERTINENT HISTORY:  CHF, DM II, HTN  PAIN:  Are you having pain?  Yes: NPRS scale: 8/10 Worst: 10/10 Pain location: lower back, R hip, R LE Pain description: sharp, tight Aggravating factors: prolonged standing, bending Relieving factors: rest  PRECAUTIONS: None  RED FLAGS: None  WEIGHT BEARING RESTRICTIONS: No  FALLS:  Has patient fallen in last 6 months? No  LIVING ENVIRONMENT: Lives with: lives with their son and lives with their daughter Lives in: House/apartment Stairs: Yes: External: 6 steps; can reach both Has following equipment at home: None  OCCUPATION: Not currently working  PLOF: Independent  PATIENT GOALS: improve standing tolerance, decrease lower back pain  NEXT MD VISIT: November 2024  OBJECTIVE:   DIAGNOSTIC FINDINGS:  N/A  PATIENT SURVEYS:  FOTO: 38% function; 49% predicted  COGNITION: Overall cognitive status: Within functional limits for tasks assessed     SENSATION: WFL  MUSCLE LENGTH: Hamstrings: DNT Thomas test: DNT  POSTURE: rounded shoulders, forward head, increased lumbar lordosis, and large body habitus  PALPATION: TTP to R sided lumbar paraspinals, R gluteals  LUMBAR ROM:   AROM eval  Flexion WFL  Extension Reduced - pain  Right lateral flexion   Left lateral flexion   Right rotation Reduced - pain  Left rotation WFL   (Blank rows = not tested)  LOWER EXTREMITY MMT:    MMT Right eval Left eval  Hip flexion 3+/5 3+/5  Hip extension    Hip abduction 3+/5 3+/5  Hip adduction 3+/5 3+/5  Hip internal rotation    Hip external rotation    Knee flexion 3+/5 3+/5  Knee extension 3+/5 3+/5  Ankle dorsiflexion    Ankle plantarflexion    Ankle inversion    Ankle eversion     (Blank rows = not  tested)  LUMBAR SPECIAL TESTS:  Straight leg raise test: Negative and Slump test: Negative  FUNCTIONAL TESTS:  30 Second Sit to Stand: 6 reps with UE   GAIT: Distance walked: 48ft Assistive device utilized: None Level of assistance: Complete Independence Comments: antalgic gait, wide BoS  TREATMENT: [facility] Adult PT Treatment:                                                DATE: 05/31/2023 Therapeutic Exercise: NuStep lvl 5 x 5 min while taking subjective Repeated flexion in sitting with physioball x 10 LTR x 5 each - 5" hold Supine ball squeeze 2x10 - 5" hold Supine pilates SLR 2x10 each Bridge (small range) 2x10 LAQ 2x10 4# Row 2x10 20# Pallof pres 2x10 7# Seated clamshell 3x20 black band  [facility] Adult PT Treatment:                                                DATE: 05/15/2023 Therapeutic Exercise: Repated flexion in sitting with physioball x 10 Supine ball squeeze x 10 - 5" hold Supine clamshell x 10 blue band Supine SLR x 10 each LTR x 5 each Bridge (small range) x 10  PATIENT EDUCATION:  Education details: eval findings, FOTO, HEP, POC Person educated: Patient Education method: Explanation, Demonstration, and Handouts Education comprehension: verbalized understanding and returned demonstration  HOME EXERCISE PROGRAM: Access Code: [email or web address] URL: [email or web address] Date: 05/15/2023 Prepared by: Edwinna Areola  Exercises - Seated Lumbar Flexion Stretch  - 2 x daily - 7 x weekly - 2 sets - 10 reps - 5 sec hold - Supine Hip Adduction Isometric with Ball  - 2 x daily - 7 x weekly - 3 sets - 10 reps -  5 sec hold - Hooklying Abduction with Resistance  - 2 x daily - 7 x weekly - 3 sets - 10 reps - blue band hold - Active Straight Leg Raise with Quad Set  - 2 x daily - 7 x weekly - 3 sets - 10 reps - Supine Lower Trunk Rotation  - 2 x daily - 7 x weekly - 2 sets - 10 reps - Beginner Bridge  - 2 x daily - 7 x weekly - 3 sets - 10  reps  ASSESSMENT:  CLINICAL IMPRESSION: Pt was able to complete all prescribed exercises today with no adverse effect. Therapy focused on core and proximal hip strengthening for decreasing pain and improving functional mobility. Continues to benefit from skilled PT, will continue per POC.   OBJECTIVE IMPAIRMENTS: decreased activity tolerance, decreased endurance, decreased mobility, difficulty walking, decreased ROM, decreased strength, postural dysfunction, and pain   ACTIVITY LIMITATIONS: carrying, lifting, standing, squatting, stairs, transfers, and locomotion level  PARTICIPATION LIMITATIONS: meal prep, cleaning, laundry, driving, shopping, community activity, and yard work  PERSONAL FACTORS: Fitness, Time since onset of injury/illness/exacerbation, and 3+ comorbidities: CHF, DM II, HTN  are also affecting patient's functional outcome.   REHAB POTENTIAL: Good  CLINICAL DECISION MAKING: Evolving/moderate complexity  EVALUATION COMPLEXITY: Moderate   GOALS: Goals reviewed with patient? No  SHORT TERM GOALS: Target date: 06/05/2023   Pt will be compliant and knowledgeable with initial HEP for improved comfort and carryover Baseline: initial HEP given  Goal status: INITIAL  2.  Pt will self report lower back pain no greater than 7/10 for improved comfort and functional ability Baseline: 10/10 at worst Goal status: INITIAL   LONG TERM GOALS: Target date: 07/10/2023   Pt will improve FOTO function score to no less than 49% as proxy for functional improvement with home ADLs and community activities Baseline: 38% function Goal status: INITIAL   2.  Pt will self report lower back pain no greater than 4/10 for improved comfort and functional ability Baseline: 10/10 at worst Goal status: INITIAL   3.  Pt will increase 30 Second Sit to Stand rep count to no less than 10 reps for improved balance, strength, and functional mobility Baseline: 6 reps with UE Goal status: INITIAL    4.  Pt will improve standing tolerance to at least 45-60 minutes without having to stop activity due to back pain for improved comfort and functional ability with home ADLs and community activites Baseline:  Goal status: INITIAL  5.  Pt will improve LE MMT to no less than 4/5 for all tested motions for improved functional mobility and decreased back pain Baseline: see MMT chart Goal status: INITIAL  PLAN:  PT FREQUENCY: 1-2x/week  PT DURATION: 8 weeks  PLANNED INTERVENTIONS: Therapeutic exercises, Therapeutic activity, Neuromuscular re-education, Balance training, Gait training, Patient/Family education, Self Care, Joint mobilization, Aquatic Therapy, Dry Needling, Electrical stimulation, Cryotherapy, Moist heat, Vasopneumatic device, Manual therapy, and Re-evaluation.  PLAN FOR NEXT SESSION: assess HEP response, core/hip strengthening, lumbar ROM  Check all possible CPT codes: 40981 - PT Re-evaluation, 97110- Therapeutic Exercise, [phone]- Neuro Re-education, [phone] - Gait Training, [phone] - Manual Therapy, [phone] - Therapeutic Activities, [phone] - Self Care, and [phone] - Aquatic therapy    Check all conditions that are expected to impact treatment: {Conditions expected to impact treatment:Diabetes mellitus and Musculoskeletal disorders   If treatment provided at initial evaluation, no treatment charged due to lack of authorization.    Eloy End, PT 05/31/2023, 11:26 AM

## 2023-06-05 ENCOUNTER — Other Ambulatory Visit: Payer: Self-pay

## 2023-06-05 ENCOUNTER — Ambulatory Visit: Payer: Medicaid Other | Admitting: Physical Therapy

## 2023-06-05 ENCOUNTER — Encounter: Payer: Self-pay | Admitting: Physical Therapy

## 2023-06-05 DIAGNOSIS — M5459 Other low back pain: Secondary | ICD-10-CM | POA: Diagnosis not present

## 2023-06-05 DIAGNOSIS — M6281 Muscle weakness (generalized): Secondary | ICD-10-CM

## 2023-06-05 DIAGNOSIS — R2689 Other abnormalities of gait and mobility: Secondary | ICD-10-CM

## 2023-06-05 NOTE — Therapy (Signed)
OUTPATIENT PHYSICAL THERAPY TREATMENT   Patient Name: Kaylee [name] MRN: [id number] DOB:[birth year], 52 y.o., female Today's Date: 06/05/2023  END OF SESSION:  PT End of Session - 06/05/23 1105     Visit Number 3    Number of Visits 17    Date for PT Re-Evaluation 07/10/23    Authorization Type [place] [employer]    Authorization Time Period 05/29/2023 - 07/27/2023    Authorization - Visit Number 2    Authorization - Number of Visits 9    PT Start Time 1055    PT Stop Time 1135    PT Time Calculation (min) 40 min    Activity Tolerance Patient tolerated treatment well    Behavior During Therapy [facility] for tasks assessed/performed               Past Medical History:  Diagnosis Date   Chronic diastolic CHF (congestive heart failure) (HCC) 02/06/2022   Essential hypertension 11/30/2020   Heavy menstrual period    Hypercalcemia 02/06/2022   Hyperlipidemia 02/06/2022   Lymphedema    Morbid obesity (HCC)    Obesity, morbid, BMI 50 or higher (HCC) 11/30/2020   Sciatica    Type 2 diabetes mellitus (HCC) 11/30/2020   Past Surgical History:  Procedure Laterality Date   s/p of leep cervix     TUBAL LIGATION     Patient Active Problem List   Diagnosis Date Noted   Hyperlipidemia 02/06/2022   Obesity, morbid, BMI 50 or higher (HCC) 11/30/2020   Moderate persistent asthma without complication 11/30/2020   Essential hypertension 11/30/2020   History of anemia 11/30/2020   Type 2 diabetes mellitus (HCC) 11/30/2020   Primary osteoarthritis of both knees 01/17/2019   Menorrhagia with regular cycle 10/13/2018    PCP: Storm Frisk, MD  REFERRING PROVIDER: Remus Loffler, PA-C   REFERRING DIAG: M54.50 (ICD-10-CM) - Low back pain, unspecified   Rationale for Evaluation and Treatment: Rehabilitation  THERAPY DIAG:  Other low back pain  Muscle weakness (generalized)  Other abnormalities of gait and mobility  ONSET DATE: Chronic  SUBJECTIVE:                                                                                                                                                                                           SUBJECTIVE STATEMENT: Patient report she is doing great and arrives denying pain this visit.   PERTINENT HISTORY:  CHF, DM II, HTN  PAIN:  Are you having pain?  Yes: NPRS scale: 0/10 Worst: 10/10 Pain location: lower back, R hip, R LE Pain description: sharp, tight Aggravating factors: prolonged standing, bending Relieving  factors: rest  PRECAUTIONS: None  RED FLAGS: None   WEIGHT BEARING RESTRICTIONS: No  FALLS:  Has patient fallen in last 6 months? No  LIVING ENVIRONMENT: Lives with: lives with their son and lives with their daughter Lives in: House/apartment Stairs: Yes: External: 6 steps; can reach both Has following equipment at home: None  OCCUPATION: Not currently working  PLOF: Independent  PATIENT GOALS: improve standing tolerance, decrease lower back pain  NEXT MD VISIT: November 2024   OBJECTIVE:  PATIENT SURVEYS:  FOTO: 38% function; 49% predicted  COGNITION: Overall cognitive status: Within functional limits for tasks assessed     SENSATION: WFL  MUSCLE LENGTH: Hamstrings: DNT Thomas test: DNT  POSTURE: rounded shoulders, forward head, increased lumbar lordosis, and large body habitus  PALPATION: TTP to R sided lumbar paraspinals, R gluteals  LUMBAR ROM:   AROM eval  Flexion WFL  Extension Reduced - pain  Right lateral flexion   Left lateral flexion   Right rotation Reduced - pain  Left rotation WFL   (Blank rows = not tested)  LOWER EXTREMITY MMT:    MMT Right eval Left eval  Hip flexion 3+/5 3+/5  Hip extension    Hip abduction 3+/5 3+/5  Hip adduction 3+/5 3+/5  Hip internal rotation    Hip external rotation    Knee flexion 3+/5 3+/5  Knee extension 3+/5 3+/5  Ankle dorsiflexion    Ankle plantarflexion    Ankle inversion    Ankle eversion     (Blank rows =  not tested)  LUMBAR SPECIAL TESTS:  Straight leg raise test: Negative and Slump test: Negative  FUNCTIONAL TESTS:  30 Second Sit to Stand: 6 reps with UE   06/05/2023: 7 reps with hands on thighs  GAIT: Distance walked: 59ft Assistive device utilized: None Level of assistance: Complete Independence Comments: antalgic gait, wide BoS   TREATMENT: [facility] Adult PT Treatment:                                                DATE: 06/05/2023 Therapeutic Exercise: NuStep L5 x 5 min with UE/LE while taking subjective Seated lumbar flexion stretch with physioball 10 x 5 sec Seated hamstring stretch 3 x 15 sec each LTR x 10 with 5 sec hold Bridge with ball squeeze 3 x 10 Supine pilates SLR 3 x 10 each Hooklying clamshell with blue 3 x 20 Sit to stand 3 x 10 Row with black 3 x 15 Extension and scap retraction with red 2 x 15 Pallof press with black 2 x 10   [facility] Adult PT Treatment:                                                DATE: 05/31/2023 Therapeutic Exercise: NuStep lvl 5 x 5 min while taking subjective Repeated flexion in sitting with physioball x 10 LTR x 5 each - 5" hold Supine ball squeeze 2x10 - 5" hold Supine pilates SLR 2x10 each Bridge (small range) 2x10 LAQ 2x10 4# Row 2x10 20# Pallof pres 2x10 7# Seated clamshell 3x20 black band  [facility] Adult PT Treatment:  DATE: 05/15/2023 Therapeutic Exercise: Repated flexion in sitting with physioball x 10 Supine ball squeeze x 10 - 5" hold Supine clamshell x 10 blue band Supine SLR x 10 each LTR x 5 each Bridge (small range) x 10  PATIENT EDUCATION:  Education details: HEP Person educated: Patient Education method: Programmer, multimedia, Facilities manager, and Handouts Education comprehension: verbalized understanding and returned demonstration  HOME EXERCISE PROGRAM: Access Code: [email or web address] URL: [email or web address] Date: 05/15/2023 Prepared by: Edwinna Areola  Exercises -  Seated Lumbar Flexion Stretch  - 2 x daily - 7 x weekly - 2 sets - 10 reps - 5 sec hold - Supine Hip Adduction Isometric with Ball  - 2 x daily - 7 x weekly - 3 sets - 10 reps - 5 sec hold - Hooklying Abduction with Resistance  - 2 x daily - 7 x weekly - 3 sets - 10 reps - blue band hold - Active Straight Leg Raise with Quad Set  - 2 x daily - 7 x weekly - 3 sets - 10 reps - Supine Lower Trunk Rotation  - 2 x daily - 7 x weekly - 2 sets - 10 reps - Beginner Bridge  - 2 x daily - 7 x weekly - 3 sets - 10 reps   ASSESSMENT: CLINICAL IMPRESSION: Patient tolerated therapy well with no adverse effects. Therapy focused primarily on progressing her core, hip, LE, and postural strengthening with good tolerance. She demonstrated improvement in her 30 sec stand test and did not report any increased pain with therapy this visit. No changes made to her HEP. Patient would benefit from continued skilled PT to progress her mobility and strength in order to reduce pain and maximize functional ability.    OBJECTIVE IMPAIRMENTS: decreased activity tolerance, decreased endurance, decreased mobility, difficulty walking, decreased ROM, decreased strength, postural dysfunction, and pain   ACTIVITY LIMITATIONS: carrying, lifting, standing, squatting, stairs, transfers, and locomotion level  PARTICIPATION LIMITATIONS: meal prep, cleaning, laundry, driving, shopping, community activity, and yard work  PERSONAL FACTORS: Fitness, Time since onset of injury/illness/exacerbation, and 3+ comorbidities: CHF, DM II, HTN  are also affecting patient's functional outcome.   REHAB POTENTIAL: Good  CLINICAL DECISION MAKING: Evolving/moderate complexity  EVALUATION COMPLEXITY: Moderate   GOALS: Goals reviewed with patient? No  SHORT TERM GOALS: Target date: 06/05/2023   Pt will be compliant and knowledgeable with initial HEP for improved comfort and carryover Baseline: initial HEP given  06/05/2023: progressing Goal  status: ONGOING  2.  Pt will self report lower back pain no greater than 7/10 for improved comfort and functional ability Baseline: 10/10 at worst 06/05/2023: reports continued 10/10 pain at worst Goal status: ONGOING  LONG TERM GOALS: Target date: 07/10/2023   Pt will improve FOTO function score to no less than 49% as proxy for functional improvement with home ADLs and community activities Baseline: 38% function Goal status: INITIAL   2.  Pt will self report lower back pain no greater than 4/10 for improved comfort and functional ability Baseline: 10/10 at worst Goal status: INITIAL   3.  Pt will increase 30 Second Sit to Stand rep count to no less than 10 reps for improved balance, strength, and functional mobility Baseline: 6 reps with UE Goal status: INITIAL   4.  Pt will improve standing tolerance to at least 45-60 minutes without having to stop activity due to back pain for improved comfort and functional ability with home ADLs and community activites Baseline:  Goal status: INITIAL  5.  Pt will  improve LE MMT to no less than 4/5 for all tested motions for improved functional mobility and decreased back pain Baseline: see MMT chart Goal status: INITIAL   PLAN: PT FREQUENCY: 1-2x/week  PT DURATION: 8 weeks  PLANNED INTERVENTIONS: Therapeutic exercises, Therapeutic activity, Neuromuscular re-education, Balance training, Gait training, Patient/Family education, Self Care, Joint mobilization, Aquatic Therapy, Dry Needling, Electrical stimulation, Cryotherapy, Moist heat, Vasopneumatic device, Manual therapy, and Re-evaluation.  PLAN FOR NEXT SESSION: assess HEP response, core/hip strengthening, lumbar ROM     Rosana Hoes, PT, DPT, LAT, ATC 06/05/23  11:40 AM Phone: [phone] Fax: [phone]

## 2023-06-06 ENCOUNTER — Ambulatory Visit: Payer: Medicaid Other | Admitting: Cardiology

## 2023-06-07 ENCOUNTER — Encounter: Payer: Self-pay | Admitting: Physical Therapy

## 2023-06-07 ENCOUNTER — Ambulatory Visit: Payer: Medicaid Other | Attending: Physician Assistant | Admitting: Physical Therapy

## 2023-06-07 DIAGNOSIS — M5459 Other low back pain: Secondary | ICD-10-CM | POA: Insufficient documentation

## 2023-06-07 DIAGNOSIS — R2689 Other abnormalities of gait and mobility: Secondary | ICD-10-CM | POA: Insufficient documentation

## 2023-06-07 DIAGNOSIS — M6281 Muscle weakness (generalized): Secondary | ICD-10-CM | POA: Diagnosis present

## 2023-06-07 NOTE — Therapy (Signed)
OUTPATIENT PHYSICAL THERAPY TREATMENT   Patient Name: Kaylee [name] MRN: [id number] DOB:06-04-1971, 52 y.o., female Today's Date: 06/07/2023  END OF SESSION:  PT End of Session - 06/07/23 1019     Visit Number 4    Number of Visits 17    Date for PT Re-Evaluation 07/10/23    Authorization Type [place] [employer]    Authorization Time Period 05/29/2023 - 07/27/2023    Authorization - Visit Number 3    Authorization - Number of Visits 9    PT Start Time 1020    PT Stop Time 1058    PT Time Calculation (min) 38 min               Past Medical History:  Diagnosis Date   Chronic diastolic CHF (congestive heart failure) (HCC) 02/06/2022   Essential hypertension 11/30/2020   Heavy menstrual period    Hypercalcemia 02/06/2022   Hyperlipidemia 02/06/2022   Lymphedema    Morbid obesity (HCC)    Obesity, morbid, BMI 50 or higher (HCC) 11/30/2020   Sciatica    Type 2 diabetes mellitus (HCC) 11/30/2020   Past Surgical History:  Procedure Laterality Date   s/p of leep cervix     TUBAL LIGATION     Patient Active Problem List   Diagnosis Date Noted   Hyperlipidemia 02/06/2022   Obesity, morbid, BMI 50 or higher (HCC) 11/30/2020   Moderate persistent asthma without complication 11/30/2020   Essential hypertension 11/30/2020   History of anemia 11/30/2020   Type 2 diabetes mellitus (HCC) 11/30/2020   Primary osteoarthritis of both knees 01/17/2019   Menorrhagia with regular cycle 10/13/2018    PCP: Storm Frisk, MD  REFERRING PROVIDER: Remus Loffler, PA-C   REFERRING DIAG: M54.50 (ICD-10-CM) - Low back pain, unspecified   Rationale for Evaluation and Treatment: Rehabilitation  THERAPY DIAG:  Other low back pain  Muscle weakness (generalized)  Other abnormalities of gait and mobility  ONSET DATE: Chronic  SUBJECTIVE:                                                                                                                                                                                           SUBJECTIVE STATEMENT: Back is doing okay. I am having problems in my left knee and thigh. Started yesterday. Happens off and on. 5/10 L knee pain. 0/10 Back pain.   PERTINENT HISTORY:  CHF, DM II, HTN  PAIN:  Are you having pain?  Yes: NPRS scale: 0/10 Worst: 10/10 Pain location: lower back, R hip, R LE Pain description: sharp, tight Aggravating factors: prolonged standing, bending Relieving factors: rest  PRECAUTIONS:  None  RED FLAGS: None   WEIGHT BEARING RESTRICTIONS: No  FALLS:  Has patient fallen in last 6 months? No  LIVING ENVIRONMENT: Lives with: lives with their son and lives with their daughter Lives in: House/apartment Stairs: Yes: External: 6 steps; can reach both Has following equipment at home: None  OCCUPATION: Not currently working  PLOF: Independent  PATIENT GOALS: improve standing tolerance, decrease lower back pain  NEXT MD VISIT: November 2024   OBJECTIVE:  PATIENT SURVEYS:  FOTO: 38% function; 49% predicted  COGNITION: Overall cognitive status: Within functional limits for tasks assessed     SENSATION: WFL  MUSCLE LENGTH: Hamstrings: DNT Thomas test: DNT  POSTURE: rounded shoulders, forward head, increased lumbar lordosis, and large body habitus  PALPATION: TTP to R sided lumbar paraspinals, R gluteals  LUMBAR ROM:   AROM eval  Flexion WFL  Extension Reduced - pain  Right lateral flexion   Left lateral flexion   Right rotation Reduced - pain  Left rotation WFL   (Blank rows = not tested)  LOWER EXTREMITY MMT:    MMT Right eval Left eval  Hip flexion 3+/5 3+/5  Hip extension    Hip abduction 3+/5 3+/5  Hip adduction 3+/5 3+/5  Hip internal rotation    Hip external rotation    Knee flexion 3+/5 3+/5  Knee extension 3+/5 3+/5  Ankle dorsiflexion    Ankle plantarflexion    Ankle inversion    Ankle eversion     (Blank rows = not tested)  LUMBAR SPECIAL TESTS:  Straight  leg raise test: Negative and Slump test: Negative  FUNCTIONAL TESTS:  30 Second Sit to Stand: 6 reps with UE   06/05/2023: 7 reps with hands on thighs  06/07/23: 8 reps with hands on thighs    GAIT: Distance walked: 56ft Assistive device utilized: None Level of assistance: Complete Independence Comments: antalgic gait, wide BoS   TREATMENT: [facility] Adult PT Treatment:                                                DATE: 06/07/2023 Therapeutic Exercise: NuStep L5 x 5 min with UE/LE while taking subjective Seated lumbar flexion stretch with physioball 10 x 5 sec LTR x 10 with 5 sec hold Bridge 3 x 10 SLR 3 x 10 each S/L clam 10 x 2 each  Sit to stand 3 x 10 Row with black 3 x 15 Extension and scap retraction with black 2 x 15 Pallof press with black 2 x 10   [facility] Adult PT Treatment:                                                DATE: 06/05/2023 Therapeutic Exercise: NuStep L5 x 5 min with UE/LE while taking subjective Seated lumbar flexion stretch with physioball 10 x 5 sec Seated hamstring stretch 3 x 15 sec each LTR x 10 with 5 sec hold Bridge with ball squeeze 3 x 10 Supine pilates SLR 3 x 10 each Hooklying clamshell with blue 3 x 20 Sit to stand 3 x 10 Row with black 3 x 15 Extension and scap retraction with red 2 x 15 Pallof press with black 2 x 10   [facility] Adult  PT Treatment:                                                DATE: 05/31/2023 Therapeutic Exercise: NuStep lvl 5 x 5 min while taking subjective Repeated flexion in sitting with physioball x 10 LTR x 5 each - 5" hold Supine ball squeeze 2x10 - 5" hold Supine pilates SLR 2x10 each Bridge (small range) 2x10 LAQ 2x10 4# Row 2x10 20# Pallof pres 2x10 7# Seated clamshell 3x20 black band  [facility] Adult PT Treatment:                                                DATE: 05/15/2023 Therapeutic Exercise: Repated flexion in sitting with physioball x 10 Supine ball squeeze x 10 - 5" hold Supine clamshell x 10 blue  band Supine SLR x 10 each LTR x 5 each Bridge (small range) x 10  PATIENT EDUCATION:  Education details: HEP Person educated: Patient Education method: Programmer, multimedia, Facilities manager, and Handouts Education comprehension: verbalized understanding and returned demonstration  HOME EXERCISE PROGRAM: Access Code: [email or web address] URL: [email or web address] Date: 05/15/2023 Prepared by: Edwinna Areola  Exercises - Seated Lumbar Flexion Stretch  - 2 x daily - 7 x weekly - 2 sets - 10 reps - 5 sec hold - Supine Hip Adduction Isometric with Ball  - 2 x daily - 7 x weekly - 3 sets - 10 reps - 5 sec hold - Hooklying Abduction with Resistance  - 2 x daily - 7 x weekly - 3 sets - 10 reps - blue band hold - Active Straight Leg Raise with Quad Set  - 2 x daily - 7 x weekly - 3 sets - 10 reps - Supine Lower Trunk Rotation  - 2 x daily - 7 x weekly - 2 sets - 10 reps - Beginner Bridge  - 2 x daily - 7 x weekly - 3 sets - 10 reps   ASSESSMENT: CLINICAL IMPRESSION: Patient tolerated therapy well with no adverse effects. Therapy focused primarily on progressing her core, hip, LE, and postural strengthening with good tolerance. She demonstrated improvement in her 30 sec stand test and did not report any increased pain with therapy this visit. She is independent with HEP. No changes made to her HEP. Patient would benefit from continued skilled PT to progress her mobility and strength in order to reduce pain and maximize functional ability.    OBJECTIVE IMPAIRMENTS: decreased activity tolerance, decreased endurance, decreased mobility, difficulty walking, decreased ROM, decreased strength, postural dysfunction, and pain   ACTIVITY LIMITATIONS: carrying, lifting, standing, squatting, stairs, transfers, and locomotion level  PARTICIPATION LIMITATIONS: meal prep, cleaning, laundry, driving, shopping, community activity, and yard work  PERSONAL FACTORS: Fitness, Time since onset of  injury/illness/exacerbation, and 3+ comorbidities: CHF, DM II, HTN  are also affecting patient's functional outcome.   REHAB POTENTIAL: Good  CLINICAL DECISION MAKING: Evolving/moderate complexity  EVALUATION COMPLEXITY: Moderate   GOALS: Goals reviewed with patient? No  SHORT TERM GOALS: Target date: 06/05/2023   Pt will be compliant and knowledgeable with initial HEP for improved comfort and carryover Baseline: initial HEP given  06/05/2023: progressing 06/07/23: I Goal status: MET  2.  Pt will self report lower back pain no greater  than 7/10 for improved comfort and functional ability Baseline: 10/10 at worst 06/05/2023: reports continued 10/10 pain at worst 06/07/23: still about the same  Goal status: ONGOING  LONG TERM GOALS: Target date: 07/10/2023   Pt will improve FOTO function score to no less than 49% as proxy for functional improvement with home ADLs and community activities Baseline: 38% function Goal status: INITIAL   2.  Pt will self report lower back pain no greater than 4/10 for improved comfort and functional ability Baseline: 10/10 at worst Goal status: INITIAL   3.  Pt will increase 30 Second Sit to Stand rep count to no less than 10 reps for improved balance, strength, and functional mobility Baseline: 6 reps with UE Goal status: INITIAL   4.  Pt will improve standing tolerance to at least 45-60 minutes without having to stop activity due to back pain for improved comfort and functional ability with home ADLs and community activites Baseline:  Goal status: INITIAL  5.  Pt will improve LE MMT to no less than 4/5 for all tested motions for improved functional mobility and decreased back pain Baseline: see MMT chart Goal status: INITIAL   PLAN: PT FREQUENCY: 1-2x/week  PT DURATION: 8 weeks  PLANNED INTERVENTIONS: Therapeutic exercises, Therapeutic activity, Neuromuscular re-education, Balance training, Gait training, Patient/Family education, Self  Care, Joint mobilization, Aquatic Therapy, Dry Needling, Electrical stimulation, Cryotherapy, Moist heat, Vasopneumatic device, Manual therapy, and Re-evaluation.  PLAN FOR NEXT SESSION: assess HEP response, core/hip strengthening, lumbar ROM     Jannette Spanner, PTA 06/07/23 10:56 AM Phone: [phone] Fax: [phone]

## 2023-06-12 ENCOUNTER — Ambulatory Visit: Payer: Medicaid Other | Admitting: Physical Therapy

## 2023-06-14 ENCOUNTER — Ambulatory Visit: Payer: Medicaid Other | Admitting: Physical Therapy

## 2023-06-19 ENCOUNTER — Encounter: Payer: Self-pay | Admitting: Physical Therapy

## 2023-06-19 ENCOUNTER — Ambulatory Visit: Payer: Medicaid Other | Admitting: Physical Therapy

## 2023-06-19 DIAGNOSIS — R2689 Other abnormalities of gait and mobility: Secondary | ICD-10-CM

## 2023-06-19 DIAGNOSIS — M5459 Other low back pain: Secondary | ICD-10-CM | POA: Diagnosis not present

## 2023-06-19 DIAGNOSIS — M6281 Muscle weakness (generalized): Secondary | ICD-10-CM

## 2023-06-19 NOTE — Therapy (Signed)
OUTPATIENT PHYSICAL THERAPY TREATMENT   Patient Name: Kaylee [name] MRN: [id number] DOB:[birth year], 52 y.o., female Today's Date: 06/19/2023  END OF SESSION:  PT End of Session - 06/19/23 1018     Visit Number 5    Number of Visits 17    Date for PT Re-Evaluation 07/10/23    Authorization Type [place] [employer]    Authorization Time Period 05/29/2023 - 07/27/2023    Authorization - Visit Number 4    Authorization - Number of Visits 9    PT Start Time 1018    PT Stop Time 1100    PT Time Calculation (min) 42 min               Past Medical History:  Diagnosis Date   Chronic diastolic CHF (congestive heart failure) (HCC) 02/06/2022   Essential hypertension 11/30/2020   Heavy menstrual period    Hypercalcemia 02/06/2022   Hyperlipidemia 02/06/2022   Lymphedema    Morbid obesity (HCC)    Obesity, morbid, BMI 50 or higher (HCC) 11/30/2020   Sciatica    Type 2 diabetes mellitus (HCC) 11/30/2020   Past Surgical History:  Procedure Laterality Date   s/p of leep cervix     TUBAL LIGATION     Patient Active Problem List   Diagnosis Date Noted   Hyperlipidemia 02/06/2022   Obesity, morbid, BMI 50 or higher (HCC) 11/30/2020   Moderate persistent asthma without complication 11/30/2020   Essential hypertension 11/30/2020   History of anemia 11/30/2020   Type 2 diabetes mellitus (HCC) 11/30/2020   Primary osteoarthritis of both knees 01/17/2019   Menorrhagia with regular cycle 10/13/2018    PCP: Storm Frisk, MD  REFERRING PROVIDER: Remus Loffler, PA-C   REFERRING DIAG: M54.50 (ICD-10-CM) - Low back pain, unspecified   Rationale for Evaluation and Treatment: Rehabilitation  THERAPY DIAG:  Other low back pain  Muscle weakness (generalized)  Other abnormalities of gait and mobility  ONSET DATE: Chronic  SUBJECTIVE:                                                                                                                                                                                           SUBJECTIVE STATEMENT: I had a slip while I was holding my 52 month old grand daughter. I caught myself but I slammed by left foot into the high chair and the 4th toe has been swollen. I was prescribed Naproxen. I was unable to walk on the foot for a couple of days. It is better today, just a little swelling.  I noticed I have been able to lay on my back better  and longer. Standing is getting better but does increased with prolonged standing, legs start going to sleep.    PERTINENT HISTORY:  CHF, DM II, HTN  PAIN:  Are you having pain?  Yes: NPRS scale: 0/10 Worst: 7/10 Pain location: lower back, R hip, R LE Pain description: sharp, tight Aggravating factors: prolonged standing, bending Relieving factors: rest  PRECAUTIONS: None  RED FLAGS: None   WEIGHT BEARING RESTRICTIONS: No  FALLS:  Has patient fallen in last 6 months? No  LIVING ENVIRONMENT: Lives with: lives with their son and lives with their daughter Lives in: House/apartment Stairs: Yes: External: 6 steps; can reach both Has following equipment at home: None  OCCUPATION: Not currently working  PLOF: Independent  PATIENT GOALS: improve standing tolerance, decrease lower back pain  NEXT MD VISIT: November 2024   OBJECTIVE:  PATIENT SURVEYS:  FOTO: 38% function; 49% predicted  COGNITION: Overall cognitive status: Within functional limits for tasks assessed     SENSATION: WFL  MUSCLE LENGTH: Hamstrings: DNT Thomas test: DNT  POSTURE: rounded shoulders, forward head, increased lumbar lordosis, and large body habitus  PALPATION: TTP to R sided lumbar paraspinals, R gluteals  LUMBAR ROM:   AROM eval  Flexion WFL  Extension Reduced - pain  Right lateral flexion   Left lateral flexion   Right rotation Reduced - pain  Left rotation WFL   (Blank rows = not tested)  LOWER EXTREMITY MMT:    MMT Right eval Left eval  Hip flexion 3+/5 3+/5  Hip  extension    Hip abduction 3+/5 3+/5  Hip adduction 3+/5 3+/5  Hip internal rotation    Hip external rotation    Knee flexion 3+/5 3+/5  Knee extension 3+/5 3+/5  Ankle dorsiflexion    Ankle plantarflexion    Ankle inversion    Ankle eversion     (Blank rows = not tested)  LUMBAR SPECIAL TESTS:  Straight leg raise test: Negative and Slump test: Negative  FUNCTIONAL TESTS:  30 Second Sit to Stand: 6 reps with UE   06/05/2023: 7 reps with hands on thighs  06/07/23: 8 reps with hands on thighs    GAIT: Distance walked: 31ft Assistive device utilized: None Level of assistance: Complete Independence Comments: antalgic gait, wide BoS   TREATMENT: [facility] Adult PT Treatment:                                                DATE: 06/19/23 Therapeutic Exercise: Nustep L5 LE only x 5 minutes  Row with black 3 x 15 Extension and scap retraction with black 2 x 15 Pallof press with black x 15 each Seated lumbar flexion STS 10 x 3  Bridge 3 x 10 LTR x 10 each  Side hip abduction 10 x 2  Side clam 10 x 2 each     [facility] Adult PT Treatment:                                                DATE: 06/07/2023 Therapeutic Exercise: NuStep L5 x 5 min with UE/LE while taking subjective Seated lumbar flexion stretch with physioball 10 x 5 sec LTR x 10 with 5 sec hold Bridge 3 x 10 SLR 3 x  10 each S/L clam 10 x 2 each  Sit to stand 3 x 10 Row with black 3 x 15 Extension and scap retraction with black 2 x 15 Pallof press with black 2 x 10   [facility] Adult PT Treatment:                                                DATE: 06/05/2023 Therapeutic Exercise: NuStep L5 x 5 min with UE/LE while taking subjective Seated lumbar flexion stretch with physioball 10 x 5 sec Seated hamstring stretch 3 x 15 sec each LTR x 10 with 5 sec hold Bridge with ball squeeze 3 x 10 Supine pilates SLR 3 x 10 each Hooklying clamshell with blue 3 x 20 Sit to stand 3 x 10 Row with black 3 x 15 Extension and scap  retraction with red 2 x 15 Pallof press with black 2 x 10   [facility] Adult PT Treatment:                                                DATE: 05/31/2023 Therapeutic Exercise: NuStep lvl 5 x 5 min while taking subjective Repeated flexion in sitting with physioball x 10 LTR x 5 each - 5" hold Supine ball squeeze 2x10 - 5" hold Supine pilates SLR 2x10 each Bridge (small range) 2x10 LAQ 2x10 4# Row 2x10 20# Pallof pres 2x10 7# Seated clamshell 3x20 black band    PATIENT EDUCATION:  Education details: HEP Person educated: Patient Education method: Programmer, multimedia, Demonstration, and Handouts Education comprehension: verbalized understanding and returned demonstration  HOME EXERCISE PROGRAM: Access Code: [email or web address] URL: [email or web address] Date: 05/15/2023 Prepared by: Edwinna Areola  Exercises - Seated Lumbar Flexion Stretch  - 2 x daily - 7 x weekly - 2 sets - 10 reps - 5 sec hold - Supine Hip Adduction Isometric with Ball  - 2 x daily - 7 x weekly - 3 sets - 10 reps - 5 sec hold - Hooklying Abduction with Resistance  - 2 x daily - 7 x weekly - 3 sets - 10 reps - blue band hold - Active Straight Leg Raise with Quad Set  - 2 x daily - 7 x weekly - 3 sets - 10 reps - Supine Lower Trunk Rotation  - 2 x daily - 7 x weekly - 2 sets - 10 reps - Beginner Bridge  - 2 x daily - 7 x weekly - 3 sets - 10 reps  - Clam  - 1 x daily - 7 x weekly - 2-3 sets - 10 reps - Sidelying Hip Abduction  - 1 x daily - 7 x weekly - 2-3 sets - 10 reps  ASSESSMENT: CLINICAL IMPRESSION: Patient tolerated therapy well with no adverse effects. She reports a slip without fall last week which caused her to injure her right 4th toe by hitting it on a high chair. She was unable to attend PT last week due to the toe injury. She also reports less compliance with HEP.  She notes less pain with lying supine some improvement in standing tolerance, pain reaching only 7/10 recently. STG #2 met. 30 sec STS improved.  She will start a new way of eating this week to reduce her  weight. Today we progressed lateral hip strength with hip abduction AROM which she tolerated well. Updated HEP. Patient would benefit from continued skilled PT to progress her mobility and strength in order to reduce pain and maximize functional ability.    OBJECTIVE IMPAIRMENTS: decreased activity tolerance, decreased endurance, decreased mobility, difficulty walking, decreased ROM, decreased strength, postural dysfunction, and pain   ACTIVITY LIMITATIONS: carrying, lifting, standing, squatting, stairs, transfers, and locomotion level  PARTICIPATION LIMITATIONS: meal prep, cleaning, laundry, driving, shopping, community activity, and yard work  PERSONAL FACTORS: Fitness, Time since onset of injury/illness/exacerbation, and 3+ comorbidities: CHF, DM II, HTN  are also affecting patient's functional outcome.   REHAB POTENTIAL: Good  CLINICAL DECISION MAKING: Evolving/moderate complexity  EVALUATION COMPLEXITY: Moderate   GOALS: Goals reviewed with patient? No  SHORT TERM GOALS: Target date: 06/05/2023   Pt will be compliant and knowledgeable with initial HEP for improved comfort and carryover Baseline: initial HEP given  06/05/2023: progressing 06/07/23: I Goal status: MET  2.  Pt will self report lower back pain no greater than 7/10 for improved comfort and functional ability Baseline: 10/10 at worst 06/05/2023: reports continued 10/10 pain at worst 06/07/23: still about the same  06/19/23: 7/10 at worst Goal status: MET  LONG TERM GOALS: Target date: 07/10/2023   Pt will improve FOTO function score to no less than 49% as proxy for functional improvement with home ADLs and community activities Baseline: 38% function Goal status: INITIAL   2.  Pt will self report lower back pain no greater than 4/10 for improved comfort and functional ability Baseline: 10/10 at worst Goal status: INITIAL   3.  Pt will increase 30 Second  Sit to Stand rep count to no less than 10 reps for improved balance, strength, and functional mobility Baseline: 6 reps with UE 06/19/23: 8 reps , hands on thighs.  Goal status:  ONGOING  4.  Pt will improve standing tolerance to at least 45-60 minutes without having to stop activity due to back pain for improved comfort and functional ability with home ADLs and community activites Baseline:  Goal status: INITIAL  5.  Pt will improve LE MMT to no less than 4/5 for all tested motions for improved functional mobility and decreased back pain Baseline: see MMT chart Goal status: INITIAL   PLAN: PT FREQUENCY: 1-2x/week  PT DURATION: 8 weeks  PLANNED INTERVENTIONS: Therapeutic exercises, Therapeutic activity, Neuromuscular re-education, Balance training, Gait training, Patient/Family education, Self Care, Joint mobilization, Aquatic Therapy, Dry Needling, Electrical stimulation, Cryotherapy, Moist heat, Vasopneumatic device, Manual therapy, and Re-evaluation.  PLAN FOR NEXT SESSION: assess HEP response, core/hip strengthening, lumbar ROM     Jannette Spanner, PTA 06/19/23 10:55 AM Phone: [phone] Fax: [phone]

## 2023-06-21 ENCOUNTER — Ambulatory Visit: Payer: Medicaid Other

## 2023-06-21 DIAGNOSIS — M5459 Other low back pain: Secondary | ICD-10-CM

## 2023-06-21 DIAGNOSIS — M6281 Muscle weakness (generalized): Secondary | ICD-10-CM

## 2023-06-21 NOTE — Therapy (Signed)
OUTPATIENT PHYSICAL THERAPY TREATMENT   Patient Name: Lexxus Underhill MRN: [id number] DOB:1971/07/28, 52 y.o., female Today's Date: 06/21/2023  END OF SESSION:  PT End of Session - 06/21/23 1054     Visit Number 6    Number of Visits 17    Date for PT Re-Evaluation 07/10/23    Authorization Type [employer]    Authorization Time Period 05/29/2023 - 07/27/2023    Authorization - Visit Number 5    Authorization - Number of Visits 9    PT Start Time 1100    PT Stop Time 1139    PT Time Calculation (min) 39 min                Past Medical History:  Diagnosis Date   Chronic diastolic CHF (congestive heart failure) (HCC) 02/06/2022   Essential hypertension 11/30/2020   Heavy menstrual period    Hypercalcemia 02/06/2022   Hyperlipidemia 02/06/2022   Lymphedema    Morbid obesity (HCC)    Obesity, morbid, BMI 50 or higher (HCC) 11/30/2020   Sciatica    Type 2 diabetes mellitus (HCC) 11/30/2020   Past Surgical History:  Procedure Laterality Date   s/p of leep cervix     TUBAL LIGATION     Patient Active Problem List   Diagnosis Date Noted   Hyperlipidemia 02/06/2022   Obesity, morbid, BMI 50 or higher (HCC) 11/30/2020   Moderate persistent asthma without complication 11/30/2020   Essential hypertension 11/30/2020   History of anemia 11/30/2020   Type 2 diabetes mellitus (HCC) 11/30/2020   Primary osteoarthritis of both knees 01/17/2019   Menorrhagia with regular cycle 10/13/2018    PCP: Storm Frisk, MD  REFERRING PROVIDER: Remus Loffler, PA-C   REFERRING DIAG: M54.50 (ICD-10-CM) - Low back pain, unspecified   Rationale for Evaluation and Treatment: Rehabilitation  THERAPY DIAG:  Other low back pain  Muscle weakness (generalized)  ONSET DATE: Chronic  SUBJECTIVE:                                                                                                                                                                                           SUBJECTIVE STATEMENT: Pt presents to PT with reports of no current pain in her lower back but her L LE is bothering her after hitting it on her granddaugher's high chair. Has been compliant with HEP with no adverse effect.    PERTINENT HISTORY:  CHF, DM II, HTN  PAIN:  Are you having pain?  Yes: NPRS scale: 0/10 Worst: 7/10 Pain location: lower back, R hip, R LE Pain description: sharp, tight Aggravating factors: prolonged standing,  bending Relieving factors: rest  PRECAUTIONS: None  RED FLAGS: None   WEIGHT BEARING RESTRICTIONS: No  FALLS:  Has patient fallen in last 6 months? No  LIVING ENVIRONMENT: Lives with: lives with their son and lives with their daughter Lives in: House/apartment Stairs: Yes: External: 6 steps; can reach both Has following equipment at home: None  OCCUPATION: Not currently working  PLOF: Independent  PATIENT GOALS: improve standing tolerance, decrease lower back pain  NEXT MD VISIT: November 2024   OBJECTIVE:  PATIENT SURVEYS:  FOTO: 38% function; 49% predicted  COGNITION: Overall cognitive status: Within functional limits for tasks assessed     SENSATION: WFL  MUSCLE LENGTH: Hamstrings: DNT Thomas test: DNT  POSTURE: rounded shoulders, forward head, increased lumbar lordosis, and large body habitus  PALPATION: TTP to R sided lumbar paraspinals, R gluteals  LUMBAR ROM:   AROM eval  Flexion WFL  Extension Reduced - pain  Right lateral flexion   Left lateral flexion   Right rotation Reduced - pain  Left rotation WFL   (Blank rows = not tested)  LOWER EXTREMITY MMT:    MMT Right eval Left eval  Hip flexion 3+/5 3+/5  Hip extension    Hip abduction 3+/5 3+/5  Hip adduction 3+/5 3+/5  Hip internal rotation    Hip external rotation    Knee flexion 3+/5 3+/5  Knee extension 3+/5 3+/5  Ankle dorsiflexion    Ankle plantarflexion    Ankle inversion    Ankle eversion     (Blank rows = not tested)  LUMBAR  SPECIAL TESTS:  Straight leg raise test: Negative and Slump test: Negative  FUNCTIONAL TESTS:  30 Second Sit to Stand: 6 reps with UE   06/05/2023: 7 reps with hands on thighs  06/07/23: 8 reps with hands on thighs    GAIT: Distance walked: 37ft Assistive device utilized: None Level of assistance: Complete Independence Comments: antalgic gait, wide BoS  TREATMENT: [facility] Adult [facility]  Treatment:                                                DATE: 06/21/23 Therapeutic Exercise: Nustep L5 LE only x 5 minutes while taking subjective Row 3x12 17# Pallof press 2x10 10# Seated lumbar flexion with physioball x 15 - 5"hold Supine pilates SLR 2x10 each Supine 90/90 hold 2x10" Bridge 3x10 Supine clamshell 3x15 black band STS 3x10 hold 5# DB Standing hip abd 2x10  [facility] Adult PT  Treatment:                                                DATE: 06/19/23 Therapeutic Exercise: Nustep L5 LE only x 5 minutes  Row with black 3 x 15 Extension and scap retraction with black 2 x 15 Pallof press with black x 15 each Seated lumbar flexion STS 10 x 3  Bridge 3 x 10 LTR x 10 each  Side hip abduction 10 x 2  Side clam 10 x 2 each   [facility] Adult PT Treatment:  DATE: 06/07/2023 Therapeutic Exercise: NuStep L5 x 5 min with UE/LE while taking subjective Seated lumbar flexion stretch with physioball 10 x 5 sec LTR x 10 with 5 sec hold Bridge 3 x 10 SLR 3 x 10 each S/L clam 10 x 2 each  Sit to stand 3 x 10 Row with black 3 x 15 Extension and scap retraction with black 2 x 15 Pallof press with black 2 x 10  PATIENT EDUCATION:  Education details: HEP Person educated: Patient Education method: Programmer, multimedia, Demonstration, and Handouts Education comprehension: verbalized understanding and returned demonstration  HOME EXERCISE PROGRAM: Access Code: [email or web address] URL: [email or web address] Date: 06/21/2023 Prepared by: Edwinna Areola  Exercises - Seated Lumbar Flexion Stretch  - 2 x daily - 7 x weekly - 2 sets - 10 reps - 5 sec hold - Supine Hip Adduction Isometric with Ball  - 2 x daily - 7 x weekly - 3 sets - 10 reps - 5 sec hold - Hooklying Abduction with Resistance  - 2 x daily - 7 x weekly - 3 sets - 10 reps - blue band hold - Active Straight Leg Raise with Quad Set  - 2 x daily - 7 x weekly - 3 sets - 10 reps - Supine Lower Trunk Rotation  - 2 x daily - 7 x weekly - 2 sets - 10 reps - Beginner Bridge  - 2 x daily - 7 x weekly - 3 sets - 10 reps - Clam  - 1 x daily - 7 x weekly - 2-3 sets - 10 reps - Sidelying Hip Abduction  - 1 x daily - 7 x weekly - 2-3 sets - 10 reps - Supine 90/90 Abdominal Bracing  - 1 x daily - 7 x weekly - 2-3 reps - 10 sec hold  ASSESSMENT: CLINICAL IMPRESSION: Pt was able to complete all prescribed exercises with no adverse effect. Therapy today focused on improving core and proximal hip strength in order to decrease back pain and improve function. Pt is progressing well with therapy, will continue to progress as able per POC.    OBJECTIVE IMPAIRMENTS: decreased activity tolerance, decreased endurance, decreased mobility, difficulty walking, decreased ROM, decreased strength, postural dysfunction, and pain   ACTIVITY LIMITATIONS: carrying, lifting, standing, squatting, stairs, transfers, and locomotion level  PARTICIPATION LIMITATIONS: meal prep, cleaning, laundry, driving, shopping, community activity, and yard work  PERSONAL FACTORS: Fitness, Time since onset of injury/illness/exacerbation, and 3+ comorbidities: CHF, DM II, HTN  are also affecting patient's functional outcome.   REHAB POTENTIAL: Good  CLINICAL DECISION MAKING: Evolving/moderate complexity  EVALUATION COMPLEXITY: Moderate   GOALS: Goals reviewed with patient? No  SHORT TERM GOALS: Target date: 06/05/2023   Pt will be compliant and knowledgeable with initial HEP for improved comfort and  carryover Baseline: initial HEP given  06/05/2023: progressing 06/07/23: I Goal status: MET  2.  Pt will self report lower back pain no greater than 7/10 for improved comfort and functional ability Baseline: 10/10 at worst 06/05/2023: reports continued 10/10 pain at worst 06/07/23: still about the same  06/19/23: 7/10 at worst Goal status: MET  LONG TERM GOALS: Target date: 07/10/2023   Pt will improve FOTO function score to no less than 49% as proxy for functional improvement with home ADLs and community activities Baseline: 38% function Goal status: INITIAL   2.  Pt will self report lower back pain no greater than 4/10 for improved comfort and functional ability Baseline: 10/10 at worst Goal status: INITIAL   3.  Pt will increase 30 Second Sit to Stand rep count to no less than 10 reps for improved balance, strength, and functional mobility Baseline: 6 reps with UE 06/19/23: 8 reps , hands on thighs.  Goal status:  ONGOING  4.  Pt will improve standing tolerance to at least 45-60 minutes without having to stop activity due to back pain for improved comfort and functional ability with home ADLs and community activites Baseline:  Goal status: INITIAL  5.  Pt will improve LE MMT to no less than 4/5 for all tested motions for improved functional mobility and decreased back pain Baseline: see MMT chart Goal status: INITIAL   PLAN: PT FREQUENCY: 1-2x/week  PT DURATION: 8 weeks  PLANNED INTERVENTIONS: Therapeutic exercises, Therapeutic activity, Neuromuscular re-education, Balance training, Gait training, Patient/Family education, Self Care, Joint mobilization, Aquatic Therapy, Dry Needling, Electrical stimulation, Cryotherapy, Moist heat, Vasopneumatic device, Manual therapy, and Re-evaluation.  PLAN FOR NEXT SESSION: assess HEP response, core/hip strengthening, lumbar ROM     Eloy End PT  06/21/23 11:39 AM

## 2023-07-10 ENCOUNTER — Ambulatory Visit: Payer: Medicaid Other | Admitting: Physical Therapy

## 2023-07-12 ENCOUNTER — Ambulatory Visit: Payer: Medicaid Other

## 2023-07-17 ENCOUNTER — Ambulatory Visit: Payer: Medicaid Other | Attending: Physician Assistant

## 2023-07-17 DIAGNOSIS — M5459 Other low back pain: Secondary | ICD-10-CM | POA: Diagnosis present

## 2023-07-17 DIAGNOSIS — R2689 Other abnormalities of gait and mobility: Secondary | ICD-10-CM | POA: Diagnosis present

## 2023-07-17 DIAGNOSIS — M6281 Muscle weakness (generalized): Secondary | ICD-10-CM | POA: Diagnosis present

## 2023-07-17 NOTE — Therapy (Addendum)
OUTPATIENT PHYSICAL THERAPY TREATMENT   Patient Name: Kaylee [name] MRN: [id number] DOB:[birth year], 52 y.o., female Today's Date: 07/17/2023  END OF SESSION:  PT End of Session - 07/17/23 0944     Visit Number 7    Number of Visits 17    Date for PT Re-Evaluation 07/27/23    Authorization Type [place] [employer]    Authorization Time Period 05/29/2023 - 07/27/2023    Authorization - Visit Number 6    Authorization - Number of Visits 9    PT Start Time 1015    PT Stop Time 1054    PT Time Calculation (min) 39 min                 Past Medical History:  Diagnosis Date   Chronic diastolic CHF (congestive heart failure) (HCC) 02/06/2022   Essential hypertension 11/30/2020   Heavy menstrual period    Hypercalcemia 02/06/2022   Hyperlipidemia 02/06/2022   Lymphedema    Morbid obesity (HCC)    Obesity, morbid, BMI 50 or higher (HCC) 11/30/2020   Sciatica    Type 2 diabetes mellitus (HCC) 11/30/2020   Past Surgical History:  Procedure Laterality Date   s/p of leep cervix     TUBAL LIGATION     Patient Active Problem List   Diagnosis Date Noted   Hyperlipidemia 02/06/2022   Obesity, morbid, BMI 50 or higher (HCC) 11/30/2020   Moderate persistent asthma without complication 11/30/2020   Essential hypertension 11/30/2020   History of anemia 11/30/2020   Type 2 diabetes mellitus (HCC) 11/30/2020   Primary osteoarthritis of both knees 01/17/2019   Menorrhagia with regular cycle 10/13/2018    PCP: Storm Frisk, MD  REFERRING PROVIDER: Remus Loffler, PA-C   REFERRING DIAG: M54.50 (ICD-10-CM) - Low back pain, unspecified   Rationale for Evaluation and Treatment: Rehabilitation  THERAPY DIAG:  Other low back pain  Muscle weakness (generalized)  Other abnormalities of gait and mobility  ONSET DATE: Chronic  SUBJECTIVE:                                                                                                                                                                                           SUBJECTIVE STATEMENT: Pt presents to PT with reports of L LE pain but otherwise no lower back discomfort. Has been compliant with HEP.    PERTINENT HISTORY:  CHF, DM II, HTN  PAIN:  Are you having pain?  Yes: NPRS scale: 0/10 Worst: 7/10 Pain location: lower back, R hip, R LE Pain description: sharp, tight Aggravating factors: prolonged standing, bending Relieving factors: rest  PRECAUTIONS: None  RED  FLAGS: None   WEIGHT BEARING RESTRICTIONS: No  FALLS:  Has patient fallen in last 6 months? No  LIVING ENVIRONMENT: Lives with: lives with their son and lives with their daughter Lives in: House/apartment Stairs: Yes: External: 6 steps; can reach both Has following equipment at home: None  OCCUPATION: Not currently working  PLOF: Independent  PATIENT GOALS: improve standing tolerance, decrease lower back pain  NEXT MD VISIT: November 2024   OBJECTIVE:  PATIENT SURVEYS:  FOTO: 38% function; 49% predicted  COGNITION: Overall cognitive status: Within functional limits for tasks assessed     SENSATION: WFL  MUSCLE LENGTH: Hamstrings: DNT Thomas test: DNT  POSTURE: rounded shoulders, forward head, increased lumbar lordosis, and large body habitus  PALPATION: TTP to R sided lumbar paraspinals, R gluteals  LUMBAR ROM:   AROM eval  Flexion WFL  Extension Reduced - pain  Right lateral flexion   Left lateral flexion   Right rotation Reduced - pain  Left rotation WFL   (Blank rows = not tested)  LOWER EXTREMITY MMT:    MMT Right eval Left eval  Hip flexion 3+/5 3+/5  Hip extension    Hip abduction 3+/5 3+/5  Hip adduction 3+/5 3+/5  Hip internal rotation    Hip external rotation    Knee flexion 3+/5 3+/5  Knee extension 3+/5 3+/5  Ankle dorsiflexion    Ankle plantarflexion    Ankle inversion    Ankle eversion     (Blank rows = not tested)  LUMBAR SPECIAL TESTS:  Straight leg raise test:  Negative and Slump test: Negative  FUNCTIONAL TESTS:  30 Second Sit to Stand: 6 reps with UE   06/05/2023: 7 reps with hands on thighs  06/07/23: 8 reps with hands on thighs    GAIT: Distance walked: 21ft Assistive device utilized: None Level of assistance: Complete Independence Comments: antalgic gait, wide BoS  TREATMENT: [facility]  Treatment:                                                DATE: 07/17/23 Therapeutic Exercise: Nustep L5 LE only x 4 minutes while taking subjective Row 3x12 17# Pallof press 2x10 10# Seated lumbar flexion with physioball x 15 - 5"hold Supine pilates SLR 2x10 each Bridge with black band 3x10 Supine clamshell 3x15 black band Supine 90/90 hold 3x10" STS 2x10 hold 10# KB Standing hip abd 2x10  [facility] Adult PT  Treatment:                                                DATE: 06/21/23 Therapeutic Exercise: Nustep L5 LE only x 5 minutes while taking subjective Row 3x12 17# Pallof press 2x10 10# Seated lumbar flexion with physioball x 15 - 5"hold Supine pilates SLR 2x10 each Supine 90/90 hold 2x10" Bridge 3x10 Supine clamshell 3x15 black band STS 3x10 hold 5# DB Standing hip abd 2x10  [facility] Adult PT  Treatment:                                                DATE: 06/19/23 Therapeutic Exercise:  Nustep L5 LE only x 5 minutes  Row with black 3 x 15 Extension and scap retraction with black 2 x 15 Pallof press with black x 15 each Seated lumbar flexion STS 10 x 3  Bridge 3 x 10 LTR x 10 each  Side hip abduction 10 x 2  Side clam 10 x 2 each   [facility] Adult PT Treatment:                                                DATE: 06/07/2023 Therapeutic Exercise: NuStep L5 x 5 min with UE/LE while taking subjective Seated lumbar flexion stretch with physioball 10 x 5 sec LTR x 10 with 5 sec hold Bridge 3 x 10 SLR 3 x 10 each S/L clam 10 x 2 each  Sit to stand 3 x 10 Row with black 3 x 15 Extension and scap retraction with black 2 x 15 Pallof  press with black 2 x 10  PATIENT EDUCATION:  Education details: HEP Person educated: Patient Education method: Programmer, multimedia, Facilities manager, and Handouts Education comprehension: verbalized understanding and returned demonstration  HOME EXERCISE PROGRAM: Access Code: [email or web address] URL: [email or web address] Date: 06/21/2023 Prepared by: Edwinna Areola  Exercises - Seated Lumbar Flexion Stretch  - 2 x daily - 7 x weekly - 2 sets - 10 reps - 5 sec hold - Supine Hip Adduction Isometric with Ball  - 2 x daily - 7 x weekly - 3 sets - 10 reps - 5 sec hold - Hooklying Abduction with Resistance  - 2 x daily - 7 x weekly - 3 sets - 10 reps - blue band hold - Active Straight Leg Raise with Quad Set  - 2 x daily - 7 x weekly - 3 sets - 10 reps - Supine Lower Trunk Rotation  - 2 x daily - 7 x weekly - 2 sets - 10 reps - Beginner Bridge  - 2 x daily - 7 x weekly - 3 sets - 10 reps - Clam  - 1 x daily - 7 x weekly - 2-3 sets - 10 reps - Sidelying Hip Abduction  - 1 x daily - 7 x weekly - 2-3 sets - 10 reps - Supine 90/90 Abdominal Bracing  - 1 x daily - 7 x weekly - 2-3 reps - 10 sec hold  ASSESSMENT: CLINICAL IMPRESSION: Pt was able to complete all prescribed exercises with no adverse effect. Therapy today focused on improving core and proximal hip strength in order to decrease back pain and improve function. Pt is still progressing well with therapy, will continue to progress as able per POC.    OBJECTIVE IMPAIRMENTS: decreased activity tolerance, decreased endurance, decreased mobility, difficulty walking, decreased ROM, decreased strength, postural dysfunction, and pain   ACTIVITY LIMITATIONS: carrying, lifting, standing, squatting, stairs, transfers, and locomotion level  PARTICIPATION LIMITATIONS: meal prep, cleaning, laundry, driving, shopping, community activity, and yard work  PERSONAL FACTORS: Fitness, Time since onset of injury/illness/exacerbation, and 3+ comorbidities: CHF, DM  II, HTN  are also affecting patient's functional outcome.   REHAB POTENTIAL: Good  CLINICAL DECISION MAKING: Evolving/moderate complexity  EVALUATION COMPLEXITY: Moderate   GOALS: Goals reviewed with patient? No  SHORT TERM GOALS: Target date: 06/05/2023   Pt will be compliant and knowledgeable with initial HEP for improved comfort and carryover Baseline: initial HEP given  06/05/2023: progressing 06/07/23: I Goal  status: MET  2.  Pt will self report lower back pain no greater than 7/10 for improved comfort and functional ability Baseline: 10/10 at worst 06/05/2023: reports continued 10/10 pain at worst 06/07/23: still about the same  06/19/23: 7/10 at worst Goal status: MET  LONG TERM GOALS: Target date: 07/27/2023   Pt will improve FOTO function score to no less than 49% as proxy for functional improvement with home ADLs and community activities Baseline: 38% function Goal status: INITIAL   2.  Pt will self report lower back pain no greater than 4/10 for improved comfort and functional ability Baseline: 10/10 at worst Goal status: INITIAL   3.  Pt will increase 30 Second Sit to Stand rep count to no less than 10 reps for improved balance, strength, and functional mobility Baseline: 6 reps with UE 06/19/23: 8 reps , hands on thighs.  Goal status:  ONGOING  4.  Pt will improve standing tolerance to at least 45-60 minutes without having to stop activity due to back pain for improved comfort and functional ability with home ADLs and community activites Baseline:  Goal status: INITIAL  5.  Pt will improve LE MMT to no less than 4/5 for all tested motions for improved functional mobility and decreased back pain Baseline: see MMT chart Goal status: INITIAL   PLAN: PT FREQUENCY: 1-2x/week  PT DURATION: 8 weeks  PLANNED INTERVENTIONS: Therapeutic exercises, Therapeutic activity, Neuromuscular re-education, Balance training, Gait training, Patient/Family education, Self  Care, Joint mobilization, Aquatic Therapy, Dry Needling, Electrical stimulation, Cryotherapy, Moist heat, Vasopneumatic device, Manual therapy, and Re-evaluation.  PLAN FOR NEXT SESSION: assess HEP response, core/hip strengthening, lumbar ROM     Eloy End PT  07/24/23 7:52 AM

## 2023-07-18 ENCOUNTER — Ambulatory Visit: Payer: Medicaid Other | Admitting: Cardiovascular Disease

## 2023-07-19 ENCOUNTER — Ambulatory Visit: Payer: Medicaid Other

## 2023-07-24 ENCOUNTER — Encounter: Payer: Self-pay | Admitting: Physical Therapy

## 2023-07-24 ENCOUNTER — Ambulatory Visit: Payer: Medicaid Other | Admitting: Physical Therapy

## 2023-07-24 DIAGNOSIS — M5459 Other low back pain: Secondary | ICD-10-CM | POA: Diagnosis not present

## 2023-07-24 DIAGNOSIS — M6281 Muscle weakness (generalized): Secondary | ICD-10-CM

## 2023-07-24 DIAGNOSIS — R2689 Other abnormalities of gait and mobility: Secondary | ICD-10-CM

## 2023-07-24 NOTE — Therapy (Signed)
OUTPATIENT PHYSICAL THERAPY TREATMENT   Patient Name: Kaylee [name] MRN: [id number] DOB:1971/01/22, 52 y.o., female Today's Date: 07/24/2023   PT End of Session - 07/24/23 1016     Visit Number 8    Number of Visits 17    Date for PT Re-Evaluation 07/27/23    Authorization Type [employer]    Authorization Time Period 05/29/2023 - 07/27/2023    Authorization - Visit Number 7    Authorization - Number of Visits 9    PT Start Time 1016    PT Stop Time 1100    PT Time Calculation (min) 44 min                   Past Medical History:  Diagnosis Date   Chronic diastolic CHF (congestive heart failure) (HCC) 02/06/2022   Essential hypertension 11/30/2020   Heavy menstrual period    Hypercalcemia 02/06/2022   Hyperlipidemia 02/06/2022   Lymphedema    Morbid obesity (HCC)    Obesity, morbid, BMI 50 or higher (HCC) 11/30/2020   Sciatica    Type 2 diabetes mellitus (HCC) 11/30/2020   Past Surgical History:  Procedure Laterality Date   s/p of leep cervix     TUBAL LIGATION     Patient Active Problem List   Diagnosis Date Noted   Hyperlipidemia 02/06/2022   Obesity, morbid, BMI 50 or higher (HCC) 11/30/2020   Moderate persistent asthma without complication 11/30/2020   Essential hypertension 11/30/2020   History of anemia 11/30/2020   Type 2 diabetes mellitus (HCC) 11/30/2020   Primary osteoarthritis of both knees 01/17/2019   Menorrhagia with regular cycle 10/13/2018    PCP: Storm Frisk, MD  REFERRING PROVIDER: Remus Loffler, PA-C   REFERRING DIAG: M54.50 (ICD-10-CM) - Low back pain, unspecified   Rationale for Evaluation and Treatment: Rehabilitation  THERAPY DIAG:  Other low back pain  Muscle weakness (generalized)  Other abnormalities of gait and mobility  ONSET DATE: Chronic  SUBJECTIVE:                                                                                                                                                                                           SUBJECTIVE STATEMENT: Pt presents to PT with reports of L LE pain and a little pain in back.    PERTINENT HISTORY:  CHF, DM II, HTN  PAIN:  Are you having pain?  Yes: NPRS scale: 6/10 Worst: 7/10 Pain location: lower back, R hip, R LE Pain description: sharp, tight Aggravating factors: prolonged standing, bending Relieving factors: rest  PRECAUTIONS: None  RED FLAGS: None   WEIGHT BEARING  RESTRICTIONS: No  FALLS:  Has patient fallen in last 6 months? No  LIVING ENVIRONMENT: Lives with: lives with their son and lives with their daughter Lives in: House/apartment Stairs: Yes: External: 6 steps; can reach both Has following equipment at home: None  OCCUPATION: Not currently working  PLOF: Independent  PATIENT GOALS: improve standing tolerance, decrease lower back pain  NEXT MD VISIT: November 2024   OBJECTIVE:  PATIENT SURVEYS:  FOTO: 38% function; 49% predicted 07/24/23: 53%   COGNITION: Overall cognitive status: Within functional limits for tasks assessed     SENSATION: WFL  MUSCLE LENGTH: Hamstrings: DNT Thomas test: DNT  POSTURE: rounded shoulders, forward head, increased lumbar lordosis, and large body habitus  PALPATION: TTP to R sided lumbar paraspinals, R gluteals  LUMBAR ROM:   AROM eval  Flexion WFL  Extension Reduced - pain  Right lateral flexion   Left lateral flexion   Right rotation Reduced - pain  Left rotation WFL   (Blank rows = not tested)  LOWER EXTREMITY MMT:    MMT Right eval Left eval  Hip flexion 3+/5 3+/5  Hip extension    Hip abduction 3+/5 3+/5  Hip adduction 3+/5 3+/5  Hip internal rotation    Hip external rotation    Knee flexion 3+/5 3+/5  Knee extension 3+/5 3+/5  Ankle dorsiflexion    Ankle plantarflexion    Ankle inversion    Ankle eversion     (Blank rows = not tested)  LUMBAR SPECIAL TESTS:  Straight leg raise test: Negative and Slump test:  Negative  FUNCTIONAL TESTS:  30 Second Sit to Stand: 6 reps with UE   06/05/2023: 7 reps with hands on thighs  06/07/23: 8 reps with hands on thighs     GAIT: Distance walked: 65ft Assistive device utilized: None Level of assistance: Complete Independence Comments: antalgic gait, wide BoS  TREATMENT: [facility] Adult PT Treatment:                                                DATE: 07/24/23 Therapeutic Exercise: Nustep L4 x 5 minutes  Row 1x15 17#,  1x 15 20#  Pallof Press 13# 2 x 10 each  Seated lumbar flexion with physioball x 15 - 5"hold STS 10# 10 x 3  Pilates SLR 3 x 10 each  Bridge with black band 3x10 Supine clamshell 3x15 black band Supine 90/90 15 sec x 3     [facility] Adult PT  Treatment:                                                DATE: 07/17/23 Therapeutic Exercise: Nustep L5 LE only x 4 minutes while taking subjective Row 3x12 17# Pallof press 2x10 10# Seated lumbar flexion with physioball x 15 - 5"hold Supine pilates SLR 2x10 each Bridge with black band 3x10 Supine clamshell 3x15 black band Supine 90/90 hold 3x10" STS 2x10 hold 10# KB Standing hip abd 2x10  [facility] Adult PT  Treatment:  DATE: 06/21/23 Therapeutic Exercise: Nustep L5 LE only x 5 minutes while taking subjective Row 3x12 17# Pallof press 2x10 10# Seated lumbar flexion with physioball x 15 - 5"hold Supine pilates SLR 2x10 each Supine 90/90 hold 2x10" Bridge 3x10 Supine clamshell 3x15 black band STS 3x10 hold 5# DB Standing hip abd 2x10  [facility] Adult PT  Treatment:                                                DATE: 06/19/23 Therapeutic Exercise: Nustep L5 LE only x 5 minutes  Row with black 3 x 15 Extension and scap retraction with black 2 x 15 Pallof press with black x 15 each Seated lumbar flexion STS 10 x 3  Bridge 3 x 10 LTR x 10 each  Side hip abduction 10 x 2  Side clam 10 x 2 each   [facility] Adult PT Treatment:                                                 DATE: 06/07/2023 Therapeutic Exercise: NuStep L5 x 5 min with UE/LE while taking subjective Seated lumbar flexion stretch with physioball 10 x 5 sec LTR x 10 with 5 sec hold Bridge 3 x 10 SLR 3 x 10 each S/L clam 10 x 2 each  Sit to stand 3 x 10 Row with black 3 x 15 Extension and scap retraction with black 2 x 15 Pallof press with black 2 x 10  PATIENT EDUCATION:  Education details: HEP Person educated: Patient Education method: Programmer, multimedia, Facilities manager, and Handouts Education comprehension: verbalized understanding and returned demonstration  HOME EXERCISE PROGRAM: Access Code: [email or web address] URL: [email or web address] Date: 06/21/2023 Prepared by: Edwinna Areola  Exercises - Seated Lumbar Flexion Stretch  - 2 x daily - 7 x weekly - 2 sets - 10 reps - 5 sec hold - Supine Hip Adduction Isometric with Ball  - 2 x daily - 7 x weekly - 3 sets - 10 reps - 5 sec hold - Hooklying Abduction with Resistance  - 2 x daily - 7 x weekly - 3 sets - 10 reps - blue band hold - Active Straight Leg Raise with Quad Set  - 2 x daily - 7 x weekly - 3 sets - 10 reps - Supine Lower Trunk Rotation  - 2 x daily - 7 x weekly - 2 sets - 10 reps - Beginner Bridge  - 2 x daily - 7 x weekly - 3 sets - 10 reps - Clam  - 1 x daily - 7 x weekly - 2-3 sets - 10 reps - Sidelying Hip Abduction  - 1 x daily - 7 x weekly - 2-3 sets - 10 reps - Supine 90/90 Abdominal Bracing  - 1 x daily - 7 x weekly - 2-3 reps - 10 sec hold  ASSESSMENT: CLINICAL IMPRESSION: Pt was able to complete all prescribed exercises with no adverse effect. Therapy today focused on improving core and proximal hip strength in order to decrease back pain and improve function. Pt FOTO score is improved indicating improved perceived function. LTG#1.  Pt is still progressing well with therapy, will continue to progress as able per POC.    OBJECTIVE IMPAIRMENTS: decreased  activity tolerance, decreased endurance,  decreased mobility, difficulty walking, decreased ROM, decreased strength, postural dysfunction, and pain   ACTIVITY LIMITATIONS: carrying, lifting, standing, squatting, stairs, transfers, and locomotion level  PARTICIPATION LIMITATIONS: meal prep, cleaning, laundry, driving, shopping, community activity, and yard work  PERSONAL FACTORS: Fitness, Time since onset of injury/illness/exacerbation, and 3+ comorbidities: CHF, DM II, HTN  are also affecting patient's functional outcome.   REHAB POTENTIAL: Good  CLINICAL DECISION MAKING: Evolving/moderate complexity  EVALUATION COMPLEXITY: Moderate   GOALS: Goals reviewed with patient? No  SHORT TERM GOALS: Target date: 06/05/2023   Pt will be compliant and knowledgeable with initial HEP for improved comfort and carryover Baseline: initial HEP given  06/05/2023: progressing 06/07/23: I Goal status: MET  2.  Pt will self report lower back pain no greater than 7/10 for improved comfort and functional ability Baseline: 10/10 at worst 06/05/2023: reports continued 10/10 pain at worst 06/07/23: still about the same  06/19/23: 7/10 at worst Goal status: MET  LONG TERM GOALS: Target date: 07/27/2023   Pt will improve FOTO function score to no less than 49% as proxy for functional improvement with home ADLs and community activities Baseline: 38% function 07/24/23: 53% Goal status: MET   2.  Pt will self report lower back pain no greater than 4/10 for improved comfort and functional ability Baseline: 10/10 at worst Goal status: INITIAL   3.  Pt will increase 30 Second Sit to Stand rep count to no less than 10 reps for improved balance, strength, and functional mobility Baseline: 6 reps with UE 06/19/23: 8 reps , hands on thighs.  Goal status:  ONGOING  4.  Pt will improve standing tolerance to at least 45-60 minutes without having to stop activity due to back pain for improved comfort and functional ability with home ADLs and community  activites Baseline:  Goal status: INITIAL  5.  Pt will improve LE MMT to no less than 4/5 for all tested motions for improved functional mobility and decreased back pain Baseline: see MMT chart Goal status: INITIAL   PLAN: PT FREQUENCY: 1-2x/week  PT DURATION: 8 weeks  PLANNED INTERVENTIONS: Therapeutic exercises, Therapeutic activity, Neuromuscular re-education, Balance training, Gait training, Patient/Family education, Self Care, Joint mobilization, Aquatic Therapy, Dry Needling, Electrical stimulation, Cryotherapy, Moist heat, Vasopneumatic device, Manual therapy, and Re-evaluation.  PLAN FOR NEXT SESSION: assess HEP response, core/hip strengthening, lumbar ROM     Royden Purl PTA  07/24/23 10:59 AM

## 2023-07-24 NOTE — Addendum Note (Signed)
Addended by: Eloy End on: 07/24/2023 07:53 AM   Modules accepted: Orders

## 2023-07-26 ENCOUNTER — Ambulatory Visit: Payer: Medicaid Other

## 2023-07-26 DIAGNOSIS — M5459 Other low back pain: Secondary | ICD-10-CM

## 2023-07-26 DIAGNOSIS — M6281 Muscle weakness (generalized): Secondary | ICD-10-CM

## 2023-07-26 NOTE — Therapy (Addendum)
 OUTPATIENT PHYSICAL THERAPY TREATMENT NOTE/DISCHARGE  PHYSICAL THERAPY DISCHARGE SUMMARY  Visits from Start of Care: 9  Current functional level related to goals / functional outcomes: See goals/objective   Remaining deficits: Unable to assess   Education / Equipment: HEP   Patient agrees to discharge. Patient goals were unable to assess. Patient is being discharged due to not returning since the last visit.     Patient Name: Kaylee [name] MRN: [id number] DOB:[birth year], 52 y.o., female Today's Date: 07/26/2023   PT End of Session - 07/26/23 1022     Visit Number 9    Number of Visits 1412    Date for PT Re-Evaluation 09/05/23    Authorization Type [employer]    Authorization Time Period 05/29/2023 - 07/27/2023    Authorization - Visit Number 8    Authorization - Number of Visits 9    PT Start Time 1019    PT Stop Time 1100    PT Time Calculation (min) 41 min                    Past Medical History:  Diagnosis Date   Chronic diastolic CHF (congestive heart failure) (HCC) 02/06/2022   Essential hypertension 11/30/2020   Heavy menstrual period    Hypercalcemia 02/06/2022   Hyperlipidemia 02/06/2022   Lymphedema    Morbid obesity (HCC)    Obesity, morbid, BMI 50 or higher (HCC) 11/30/2020   Sciatica    Type 2 diabetes mellitus (HCC) 11/30/2020   Past Surgical History:  Procedure Laterality Date   s/p of leep cervix     TUBAL LIGATION     Patient Active Problem List   Diagnosis Date Noted   Hyperlipidemia 02/06/2022   Obesity, morbid, BMI 50 or higher (HCC) 11/30/2020   Moderate persistent asthma without complication 11/30/2020   Essential hypertension 11/30/2020   History of anemia 11/30/2020   Type 2 diabetes mellitus (HCC) 11/30/2020   Primary osteoarthritis of both knees 01/17/2019   Menorrhagia with regular cycle 10/13/2018    PCP: Brien Belvie BRAVO, MD  REFERRING PROVIDER: Joshua Clayborne RAMAN, PA-C   REFERRING DIAG: M54.50  (ICD-10-CM) - Low back pain, unspecified   Rationale for Evaluation and Treatment: Rehabilitation  THERAPY DIAG:  Other low back pain  Muscle weakness (generalized)  ONSET DATE: Chronic  SUBJECTIVE:                                                                                                                                                                                          SUBJECTIVE STATEMENT: Pt presents to PT with reports of continued lower back and R hip  pain. Also has continued L hip pain that is more limiting at present.   PERTINENT HISTORY:  CHF, DM II, HTN  PAIN:  Are you having pain?  Yes: NPRS scale: 5/10 Worst: 7/10 Pain location: lower back, R hip, R LE Pain description: sharp, tight Aggravating factors: prolonged standing, bending Relieving factors: rest  Are you having pain?  Yes: NPRS scale: 8/10 Worst: 7/10 Pain location: L hip Pain description: sharp, tight Aggravating factors: prolonged standing, bending Relieving factors: rest  PRECAUTIONS: None  RED FLAGS: None   WEIGHT BEARING RESTRICTIONS: No  FALLS:  Has patient fallen in last 6 months? No  LIVING ENVIRONMENT: Lives with: lives with their son and lives with their daughter Lives in: House/apartment Stairs: Yes: External: 6 steps; can reach both Has following equipment at home: None  OCCUPATION: Not currently working  PLOF: Independent  PATIENT GOALS: improve standing tolerance, decrease lower back pain  NEXT MD VISIT: November 2024   OBJECTIVE:  PATIENT SURVEYS:  FOTO: 38% function; 49% predicted 07/24/23: 53%   COGNITION: Overall cognitive status: Within functional limits for tasks assessed     SENSATION: WFL  MUSCLE LENGTH: Hamstrings: DNT Thomas test: DNT  POSTURE: rounded shoulders, forward head, increased lumbar lordosis, and large body habitus  PALPATION: TTP to R sided lumbar paraspinals, R gluteals  LUMBAR ROM:   AROM eval  Flexion WFL  Extension  Reduced - pain  Right lateral flexion   Left lateral flexion   Right rotation Reduced - pain  Left rotation WFL   (Blank rows = not tested)  LOWER EXTREMITY MMT:    MMT Right eval Left eval Right 07/26/2023 Left 07/26/2023  Hip flexion 3+/5 3+/5 4/5 4/5  Hip extension      Hip abduction 3+/5 3+/5 3+/5 3+/5  Hip adduction 3+/5 3+/5 4/5 4/5  Hip internal rotation      Hip external rotation      Knee flexion 3+/5 3+/5 4/5 4/5  Knee extension 3+/5 3+/5 4/5 4/5  Ankle dorsiflexion      Ankle plantarflexion      Ankle inversion      Ankle eversion       (Blank rows = not tested)  LUMBAR SPECIAL TESTS:  Straight leg raise test: Negative and Slump test: Negative  FUNCTIONAL TESTS:  30 Second Sit to Stand: 6 reps with UE   06/05/2023: 7 reps with hands on thighs  06/07/23: 8 reps with hands on thighs  07/26/2023: 8 reps no UE    GAIT: Distance walked: 65ft Assistive device utilized: None Level of assistance: Complete Independence Comments: antalgic gait, wide BoS  TREATMENT: [facility] Adult PT Treatment:                                                DATE: 07/26/23 Therapeutic Exercise: Nustep L5 x 3 minutes while taking subjective Row 2x10 20# Pallof Press 13# 2 x 10 each  Supine pilates SLR 2x10 each Seated lumbar flexion with physioball x 15 - 5 hold Standing hip abd/ext 2x10 2.5# Lateral walk RTB x 3 laps at counter Step up fwd 8in bilat UE support x 5 R - L unable secondary to pain  [facility] Adult PT Treatment:  DATE: 07/24/23 Therapeutic Exercise: Nustep L4 x 5 minutes  Row 1x15 17#,  1x 15 20#  Pallof Press 13# 2 x 10 each  Seated lumbar flexion with physioball x 15 - 5hold STS 10# 10 x 3  Pilates SLR 3 x 10 each  Bridge with black band 3x10 Supine clamshell 3x15 black band Supine 90/90 15 sec x 3   [facility] Adult PT  Treatment:                                                DATE: 07/17/23 Therapeutic  Exercise: Nustep L5 LE only x 3 minutes while taking subjective Row 3x12 17# Pallof press 2x10 10# Seated lumbar flexion with physioball x 15 - 5hold Supine pilates SLR 2x10 each Bridge with black band 3x10 Supine clamshell 3x15 black band Supine 90/90 hold 3x10 STS 2x10 hold 10# KB Standing hip abd 2x10  PATIENT EDUCATION:  Education details: HEP Person educated: Patient Education method: Programmer, multimedia, Facilities manager, and Handouts Education comprehension: verbalized understanding and returned demonstration  HOME EXERCISE PROGRAM: Access Code: [email or web address] URL: [email or web address] Date: 06/21/2023 Prepared by: Alm Kingdom  Exercises - Seated Lumbar Flexion Stretch  - 2 x daily - 7 x weekly - 2 sets - 10 reps - 5 sec hold - Supine Hip Adduction Isometric with Ball  - 2 x daily - 7 x weekly - 3 sets - 10 reps - 5 sec hold - Hooklying Abduction with Resistance  - 2 x daily - 7 x weekly - 3 sets - 10 reps - blue band hold - Active Straight Leg Raise with Quad Set  - 2 x daily - 7 x weekly - 3 sets - 10 reps - Supine Lower Trunk Rotation  - 2 x daily - 7 x weekly - 2 sets - 10 reps - Beginner Bridge  - 2 x daily - 7 x weekly - 3 sets - 10 reps - Clam  - 1 x daily - 7 x weekly - 2-3 sets - 10 reps - Sidelying Hip Abduction  - 1 x daily - 7 x weekly - 2-3 sets - 10 reps - Supine 90/90 Abdominal Bracing  - 1 x daily - 7 x weekly - 2-3 reps - 10 sec hold  ASSESSMENT: CLINICAL IMPRESSION: Pt was able to complete all prescribed exercises with exception of L step ups due to L hip pain. She has progressed well with therapy, with improved subjective functioning assessed via FOTO and decreased lower back and R hip pain. She also continues to show improving LE strength and functional mobility. Pt would continue to benefit from skilled PT services working on improving strength and functional mobility. Will decrease frequency to 1x/wk and update HEP as appropriate.    OBJECTIVE  IMPAIRMENTS: decreased activity tolerance, decreased endurance, decreased mobility, difficulty walking, decreased ROM, decreased strength, postural dysfunction, and pain   ACTIVITY LIMITATIONS: carrying, lifting, standing, squatting, stairs, transfers, and locomotion level  PARTICIPATION LIMITATIONS: meal prep, cleaning, laundry, driving, shopping, community activity, and yard work  PERSONAL FACTORS: Fitness, Time since onset of injury/illness/exacerbation, and 3+ comorbidities: CHF, DM II, HTN are also affecting patient's functional outcome.   REHAB POTENTIAL: Good  CLINICAL DECISION MAKING: Evolving/moderate complexity  EVALUATION COMPLEXITY: Moderate   GOALS: Goals reviewed with patient? No  SHORT TERM GOALS: Target date: 06/05/2023   Pt will be compliant and knowledgeable  with initial HEP for improved comfort and carryover Baseline: initial HEP given  06/05/2023: progressing 06/07/23: Independent  Goal status: MET  2.  Pt will self report lower back pain no greater than 7/10 for improved comfort and functional ability Baseline: 10/10 at worst 06/05/2023: reports continued 10/10 pain at worst 06/07/23: still about the same  06/19/23: 7/10 at worst Goal status: MET  LONG TERM GOALS: Target date: 09/05/2023   Pt will improve FOTO function score to no less than 49% as proxy for functional improvement with home ADLs and community activities Baseline: 38% function 07/24/23: 53% Goal status: MET   2.  Pt will self report lower back pain no greater than 4/10 for improved comfort and functional ability Baseline: 10/10 at worst 07/26/2023: 6/10 at worst Goal status: IN PROGRESS   3.  Pt will increase 30 Second Sit to Stand rep count to no less than 10 reps for improved balance, strength, and functional mobility Baseline: 6 reps with UE 06/19/2023: 8 reps , hands on thighs 07/26/2023: 8 with no UE Goal status:  ONGOING  4.  Pt will improve standing tolerance to at least 45-60  minutes without having to stop activity due to back pain for improved comfort and functional ability with home ADLs and community activites Baseline: unable 07/26/2023: 10 minutes Goal status: IN PROGRESS  5.  Pt will improve LE MMT to no less than 4/5 for all tested motions for improved functional mobility and decreased back pain Baseline: see MMT chart Goal status: IN PROGRESS   PLAN: PT FREQUENCY: 1x/week  PT DURATION: 6 weeks  PLANNED INTERVENTIONS: Therapeutic exercises, Therapeutic activity, Neuromuscular re-education, Balance training, Gait training, Patient/Family education, Self Care, Joint mobilization, Aquatic Therapy, Dry Needling, Electrical stimulation, Cryotherapy, Moist heat, Vasopneumatic device, Manual therapy, and Re-evaluation.  PLAN FOR NEXT SESSION: assess HEP response, core/hip strengthening, lumbar ROM  For all possible CPT codes, reference the Planned Interventions line above.     Check all conditions that are expected to impact treatment: {Conditions expected to impact treatment:Morbid obesity, Diabetes mellitus, and Musculoskeletal disorders   If treatment provided at initial evaluation, no treatment charged due to lack of authorization.         Alm JAYSON Kingdom PT  07/26/23 11:23 AM

## 2023-07-28 ENCOUNTER — Other Ambulatory Visit: Payer: Self-pay | Admitting: Internal Medicine

## 2023-08-10 ENCOUNTER — Ambulatory Visit: Payer: Medicaid Other

## 2023-08-10 NOTE — Progress Notes (Signed)
Cardiology Office Note:  .   Date:  08/11/2023  ID:  Kaylee [name], DOB May 28, 1971, MRN [id number] PCP: Storm Frisk, MD  [facility] Providers Cardiologist:  Reatha Harps, MD   }   History of Present Illness: .   Kaylee [name] is a 52 y.o. female  with a hx of morbid obesity, HTN, HFpEF, HLD and atypical chest pain.  Last seen by Dr. Flora Lipps on 05/31/2021 for atypical chest pain.  He did not feel that she needed further cardiac evaluation and he recommended significant weight loss.    She had a normal echocardiogram, and felt that her symptoms were related to morbid obesity as well as venous insufficiency and lymphedema.  She was tachycardic and he felt this was also related to obesity.  He found no reason to continue to treated from a cardiac standpoint.  She is here today at the request of her primary care to continue establishment with cardiology.  She has no complaints of chest pain, she has some dyspnea on exertion related to morbid obesity.  She denies PND or orthopnea.  She denies excessive edema.  Her primary care provider started her on Ozempic for weight loss.  She is due to start this the end of December.  She has been medically compliant.  She needs refills on Lopressor.  Labs are followed by PCP.  ROS: As above otherwise negative  Studies Reviewed: .   Echocardiogram 01/25/2021 1. Left ventricular ejection fraction, by estimation, is 70 to 75%. The  left ventricle has hyperdynamic function. The left ventricle has no  regional wall motion abnormalities. Left ventricular diastolic parameters  are indeterminate.   2. Right ventricular systolic function is normal. The right ventricular  size is normal.   3. The mitral valve is grossly normal. No evidence of mitral valve  regurgitation. No evidence of mitral stenosis.   4. The aortic valve was not well visualized. Aortic valve regurgitation  is not visualized. No aortic stenosis is present.   5. The inferior  vena cava is dilated in size with >50% respiratory  variability, suggesting right atrial pressure of 8 mmHg.   Comparison(s): A prior study was performed on 10/13/2018. Prior images  reviewed side by side. LV is more dynamic, though seen at higher rates  (contrasted images at HR 130s).   EKG Interpretation Date/Time:  Friday August 11 2023 16:04:59 EST Ventricular Rate:  98 PR Interval:  188 QRS Duration:  86 QT Interval:  358 QTC Calculation: 457 R Axis:   -9  Text Interpretation: Normal sinus rhythm Minimal voltage criteria for LVH, may be normal variant ( R in aVL ) Cannot rule out Anterior infarct , age undetermined When compared with ECG of 06-Feb-2022 03:56, PREVIOUS ECG IS PRESENT Confirmed by Joni Reining [phone]) on 08/11/2023 4:31:29 PM    Physical Exam:   VS:  BP (!) 140/82   Pulse 98   Ht 5\' 7"  (1.702 m)   Wt (!) 421 lb (191 kg)   SpO2 97%   BMI 65.94 kg/m    Wt Readings from Last 3 Encounters:  08/11/23 (!) 421 lb (191 kg)  12/09/22 (!) 425 lb (192.8 kg)  03/10/22 (!) 427 lb (193.7 kg)    GEN: Well nourished, well developed in no acute distress NECK: No JVD; No carotid bruits CARDIAC: RRR, 2/6 systolic murmur.  Murmurs, rubs, gallops RESPIRATORY:  Clear to auscultation without rales, wheezing or rhonchi  ABDOMEN: Soft, non-tender, non-distended EXTREMITIES:  No edema; No  deformity   ASSESSMENT AND PLAN: .    Hypertension: Labs are followed by PCP and she is provided with losartan however she does need refills on Lopressor.  She is given Lopressor 25 mg to which she takes 3 tablets in the morning and 3 tablets at night.  Continue with primary care for ongoing management.  She will come back to cardiology as needed.   2.  Chronic venous insufficiency: She is on Lasix 20 mg daily.  I am not seeing any significant lower extremity edema at this time.  No evidence of volume overload.  3.  Hypercholesterolemia: Continue on atorvastatin 40 mg daily.  She is  followed by PCP for labs.  Statin is provided by PCP.         Signed, Bettey Mare. Liborio Nixon, ANP, AACC

## 2023-08-11 ENCOUNTER — Ambulatory Visit: Payer: Medicaid Other | Attending: Adult Health | Admitting: Adult Health

## 2023-08-11 ENCOUNTER — Encounter: Payer: Self-pay | Admitting: Adult Health

## 2023-08-11 VITALS — BP 140/82 | HR 98 | Ht 67.0 in | Wt >= 6400 oz

## 2023-08-11 DIAGNOSIS — I10 Essential (primary) hypertension: Secondary | ICD-10-CM | POA: Diagnosis not present

## 2023-08-11 DIAGNOSIS — I5030 Unspecified diastolic (congestive) heart failure: Secondary | ICD-10-CM

## 2023-08-11 DIAGNOSIS — E7800 Pure hypercholesterolemia, unspecified: Secondary | ICD-10-CM

## 2023-08-11 MED ORDER — METOPROLOL TARTRATE 25 MG PO TABS
75.0000 mg | ORAL_TABLET | Freq: Two times a day (BID) | ORAL | 1 refills | Status: AC
Start: 2023-08-11 — End: 2024-07-22

## 2023-08-11 NOTE — Patient Instructions (Signed)
Medication Instructions:  No Changes *If you need a refill on your cardiac medications before your next appointment, please call your pharmacy*   Lab Work: No labs If you have labs (blood work) drawn today and your tests are completely normal, you will receive your results only by: MyChart Message (if you have MyChart) OR A paper copy in the mail If you have any lab test that is abnormal or we need to change your treatment, we will call you to review the results.   Testing/Procedures: No Testing   Follow-Up: At [facility], you and your health needs are our priority.  As part of our continuing mission to provide you with exceptional heart care, we have created designated Provider Care Teams.  These Care Teams include your primary Cardiologist (physician) and Advanced Practice Providers (APPs -  Physician Assistants and Nurse Practitioners) who all work together to provide you with the care you need, when you need it.  We recommend signing up for the patient portal called "MyChart".  Sign up information is provided on this After Visit Summary.  MyChart is used to connect with patients for Virtual Visits (Telemedicine).  Patients are able to view lab/test results, encounter notes, upcoming appointments, etc.  Non-urgent messages can be sent to your provider as well.   To learn more about what you can do with MyChart, go to [email or web address].    Your next appointment:   As Needed  Provider:   Reatha Harps, MD

## 2023-08-16 ENCOUNTER — Ambulatory Visit: Payer: Medicaid Other | Admitting: Physical Therapy

## 2023-08-23 ENCOUNTER — Ambulatory Visit: Payer: Medicaid Other | Admitting: Physical Therapy

## 2024-01-17 IMAGING — CT CT ABD-PELV W/ CM
2 of 5 series · 15 of 46 positions shown, 17 images · IV contrast (agent unspecified)
Comparison: None Available.

CLINICAL DATA: 51-year-old female with macroscopic hematuria.

EXAM:
CT ABDOMEN AND PELVIS WITH CONTRAST
TECHNIQUE: Multidetector CT imaging of the abdomen and pelvis was performed
using the standard protocol following bolus administration of
intravenous contrast.

[Series 3: a/p w/ 5mm · axial · 0.78mm/px · z∈[+678,+1118]mm · 12 of 100 slices shown, 14 images]
[im 6/100  soft-tissue]
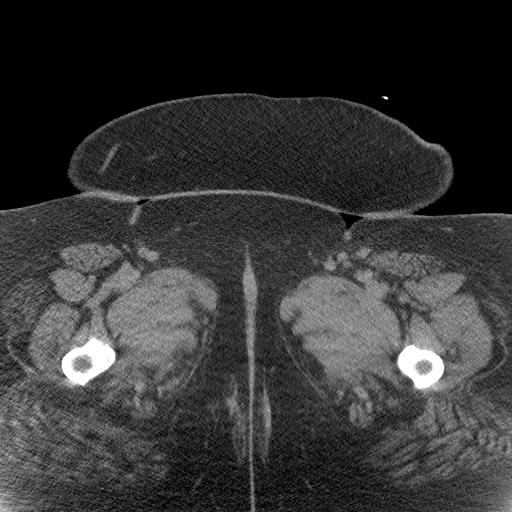
[im 6/100  bone]
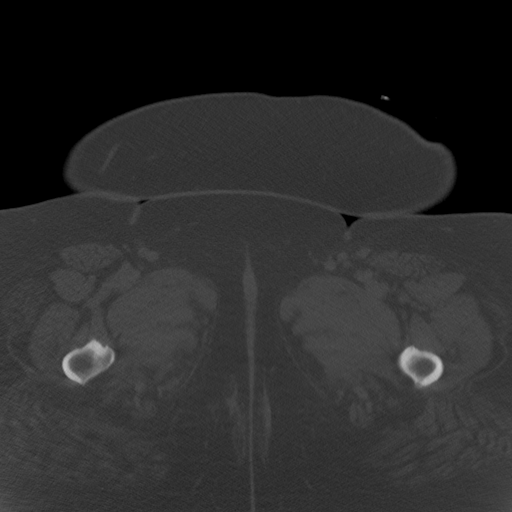
[im 17/100  soft-tissue]
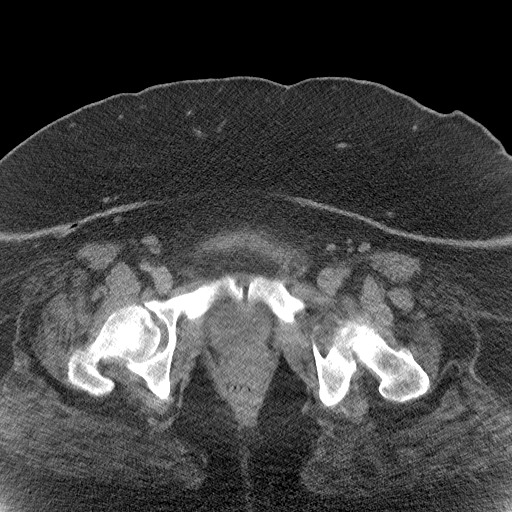
[im 23/100  soft-tissue]
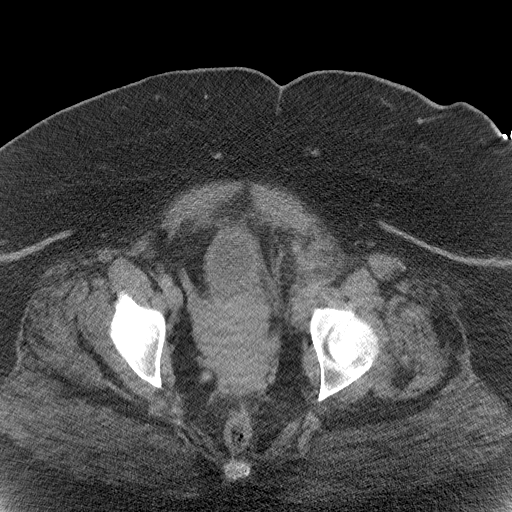
[im 28/100  soft-tissue]
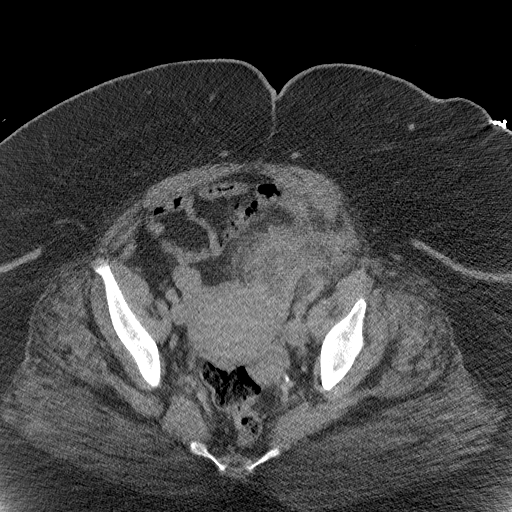
[im 39/100  soft-tissue]
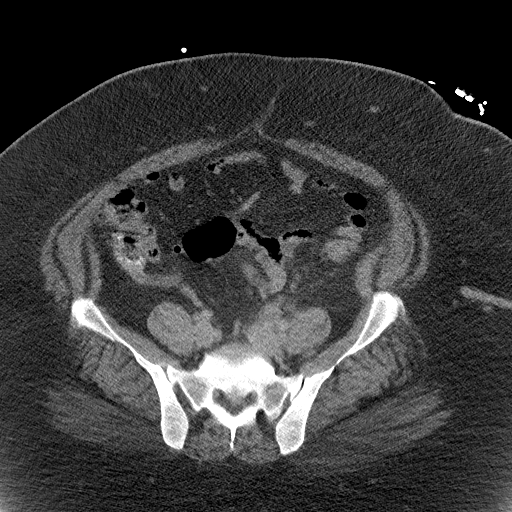
[im 45/100  soft-tissue]
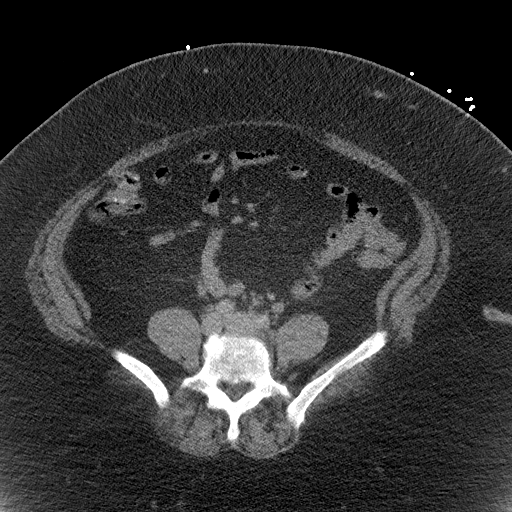
[im 56/100  soft-tissue]
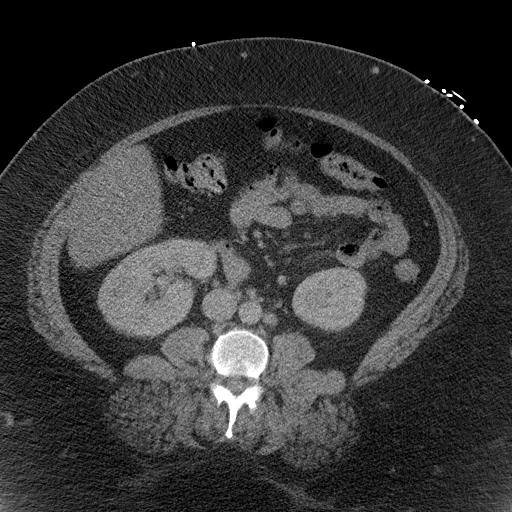
[im 61/100  soft-tissue]
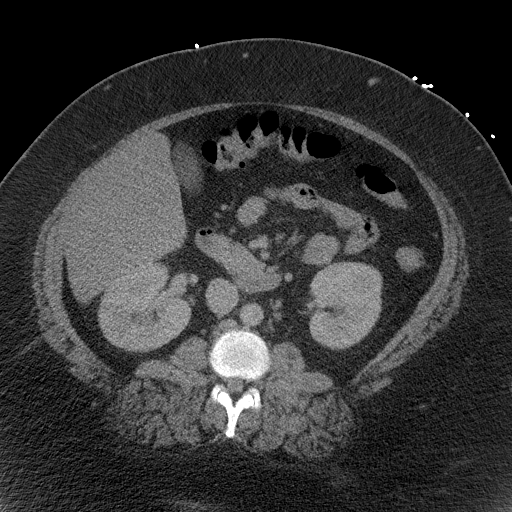
[im 72/100  soft-tissue]
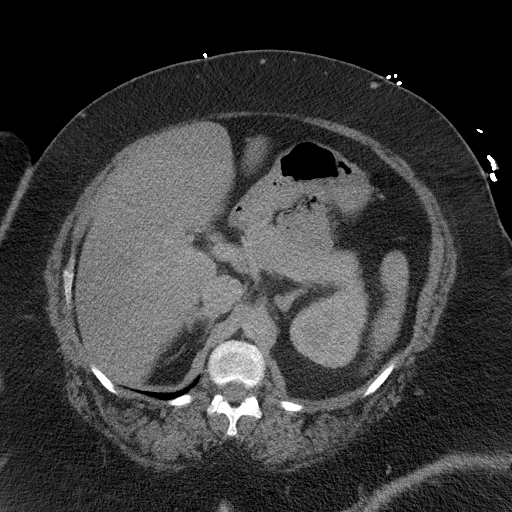
[im 72/100  bone]
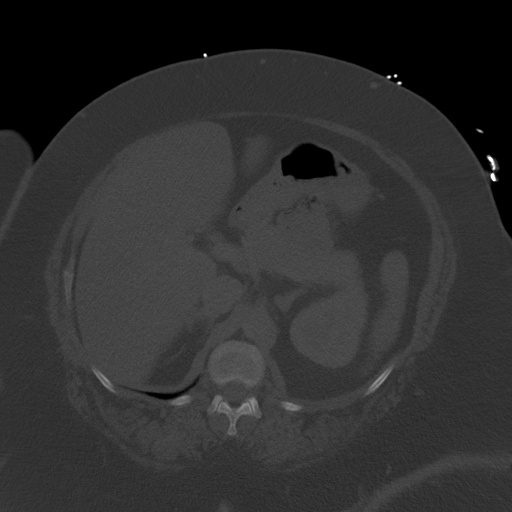
[im 78/100  soft-tissue]
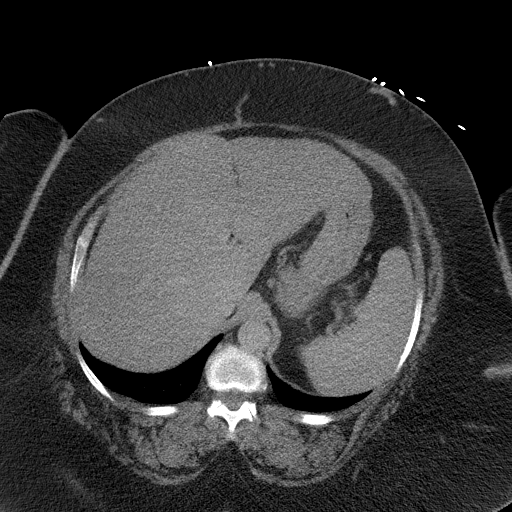
[im 83/100  soft-tissue]
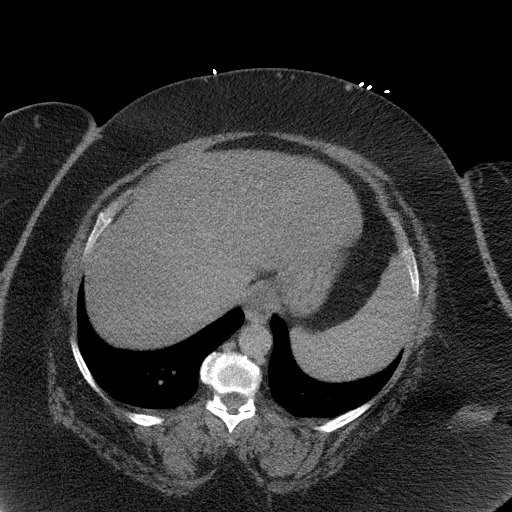
[im 94/100  soft-tissue]
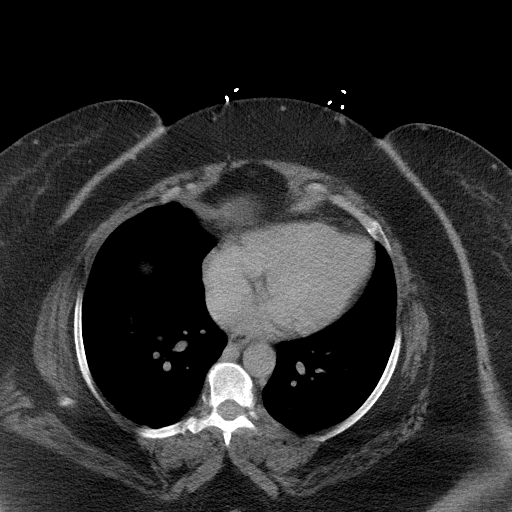

[Series 6: a/p w/ cor · coronal · 1.01mm/px · 3 of 188 slices shown]
[im 63/188  soft-tissue]
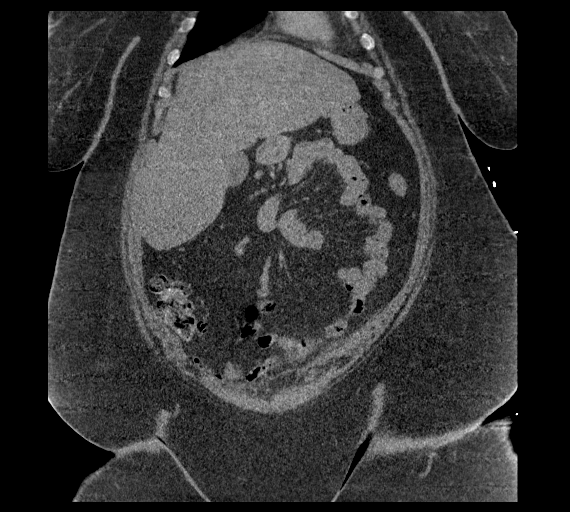
[im 84/188  soft-tissue]
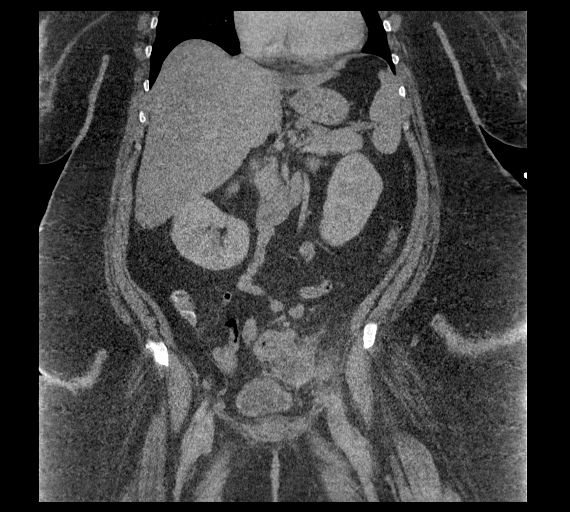
[im 104/188  soft-tissue]
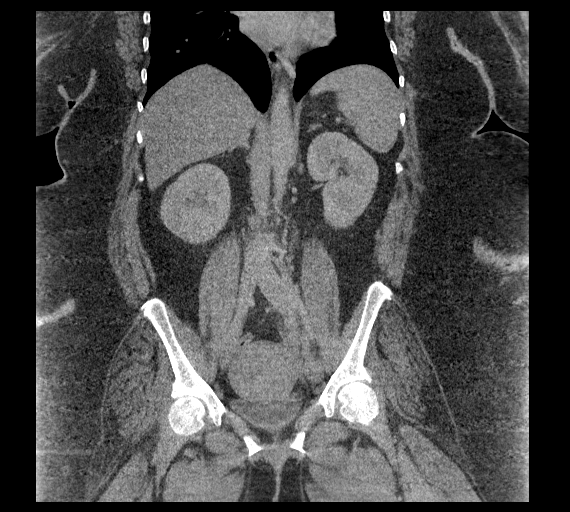

[15 of 46 positions shown; findings below may reference images not displayed]

RADIATION DOSE REDUCTION: This exam was performed according to the
departmental dose-optimization program which includes automated
exposure control, adjustment of the mA and/or kV according to
patient size and/or use of iterative reconstruction technique.

CONTRAST:  100mL OMNIPAQUE IOHEXOL 300 MG/ML  SOLN
FINDINGS: Lower chest: Large body habitus, otherwise negative. No pericardial
or pleural effusion.

Hepatobiliary: Probable hepatic steatosis. Otherwise negative liver
and gallbladder.

Pancreas: Negative.

Spleen: Negative.

Adrenals/Urinary Tract: Normal adrenal glands. Nonobstructed kidneys
with symmetric renal enhancement. However, little to no contrast
excretion on 2 minute delayed images. Still, no pararenal
inflammation identified. Ureters appear decompressed.

However, there is confluent inflammation along the course of the
left ureter at the pelvic inlet.

Mild secondary inflammation of the bladder including the bladder
dome. See intestinal details below.

Stomach/Bowel: Negative rectum and distal sigmoid colon. But the
proximal sigmoid colon is inflamed and indistinct along a segment of
about 10 cm in the left lower quadrant. Confluent regional
mesenteric inflammation contiguous with the left adnexa and bladder
dome. Confluent regional retroperitoneal inflammation at the pelvic
inlet affecting the course of the left ureter. Underlying
diverticulosis. No extraluminal gas identified. No definite
extraluminal collection of fluid.

Upstream descending colon with additional diverticulosis but
decompressed lumen. Diverticulosis also throughout the transverse
colon. Right colon mostly decompressed. Normal appendix on series 3,
image 59. No dilated small bowel. Little secondary inflammation of
small bowel in the left lower quadrant. Decompressed stomach and
duodenum. No free fluid identified.

Vascular/Lymphatic: Suboptimal intravascular contrast bolus. Major
arterial structures grossly patent.

Reactive appearing retroperitoneal lymphadenopathy at the renal
lower pole levels, somewhat bilateral. But confluent soft tissue
inflammation and/or additional asymmetric lymph nodes at the left
pelvic inlet, iliac stations.

Reproductive: Uterus and right adnexa appear within normal limits,
but the left adnexa is inseparable from confluent inflammation in
the left lower quadrant (series 3, image 72). No gas within the
uterus or vagina.

Other: Large body habitus. No pelvic free fluid.

Musculoskeletal: Advanced disc and endplate degeneration at the
lumbosacral junction. No acute osseous abnormality identified.
IMPRESSION: 1. Confluent inflammatory process in the left lower quadrant and
left pelvic inlet appears related to a Complicated Sigmoid
Diverticulitis.
Involvement of the left adnexa, course of the left ureter, and
bladder dome. Pelvic inlet and retroperitoneal lymphadenopathy
appears reactive.
No pneumoperitoneum. No definite extraluminal fluid collection
although CT Abdomen and Pelvis with both oral and IV contrast would
be more sensitive.

2. Kidneys and ureters appear nonobstructed but there is little to
no renal excretion of contrast on the delayed images, suggesting
intrinsic renal insufficiency.

3. Large body habitus, which limits some anatomic detail. Probable
hepatic steatosis.

## 2024-01-17 IMAGING — DX DG CHEST 1V PORT
1 series · 1 of 1 positions shown · non-contrast
Comparison: Chest radiographs 10/12/2018.

CLINICAL DATA: 51-year-old female with possible sepsis.

EXAM:
PORTABLE CHEST 1 VIEW

[chest]
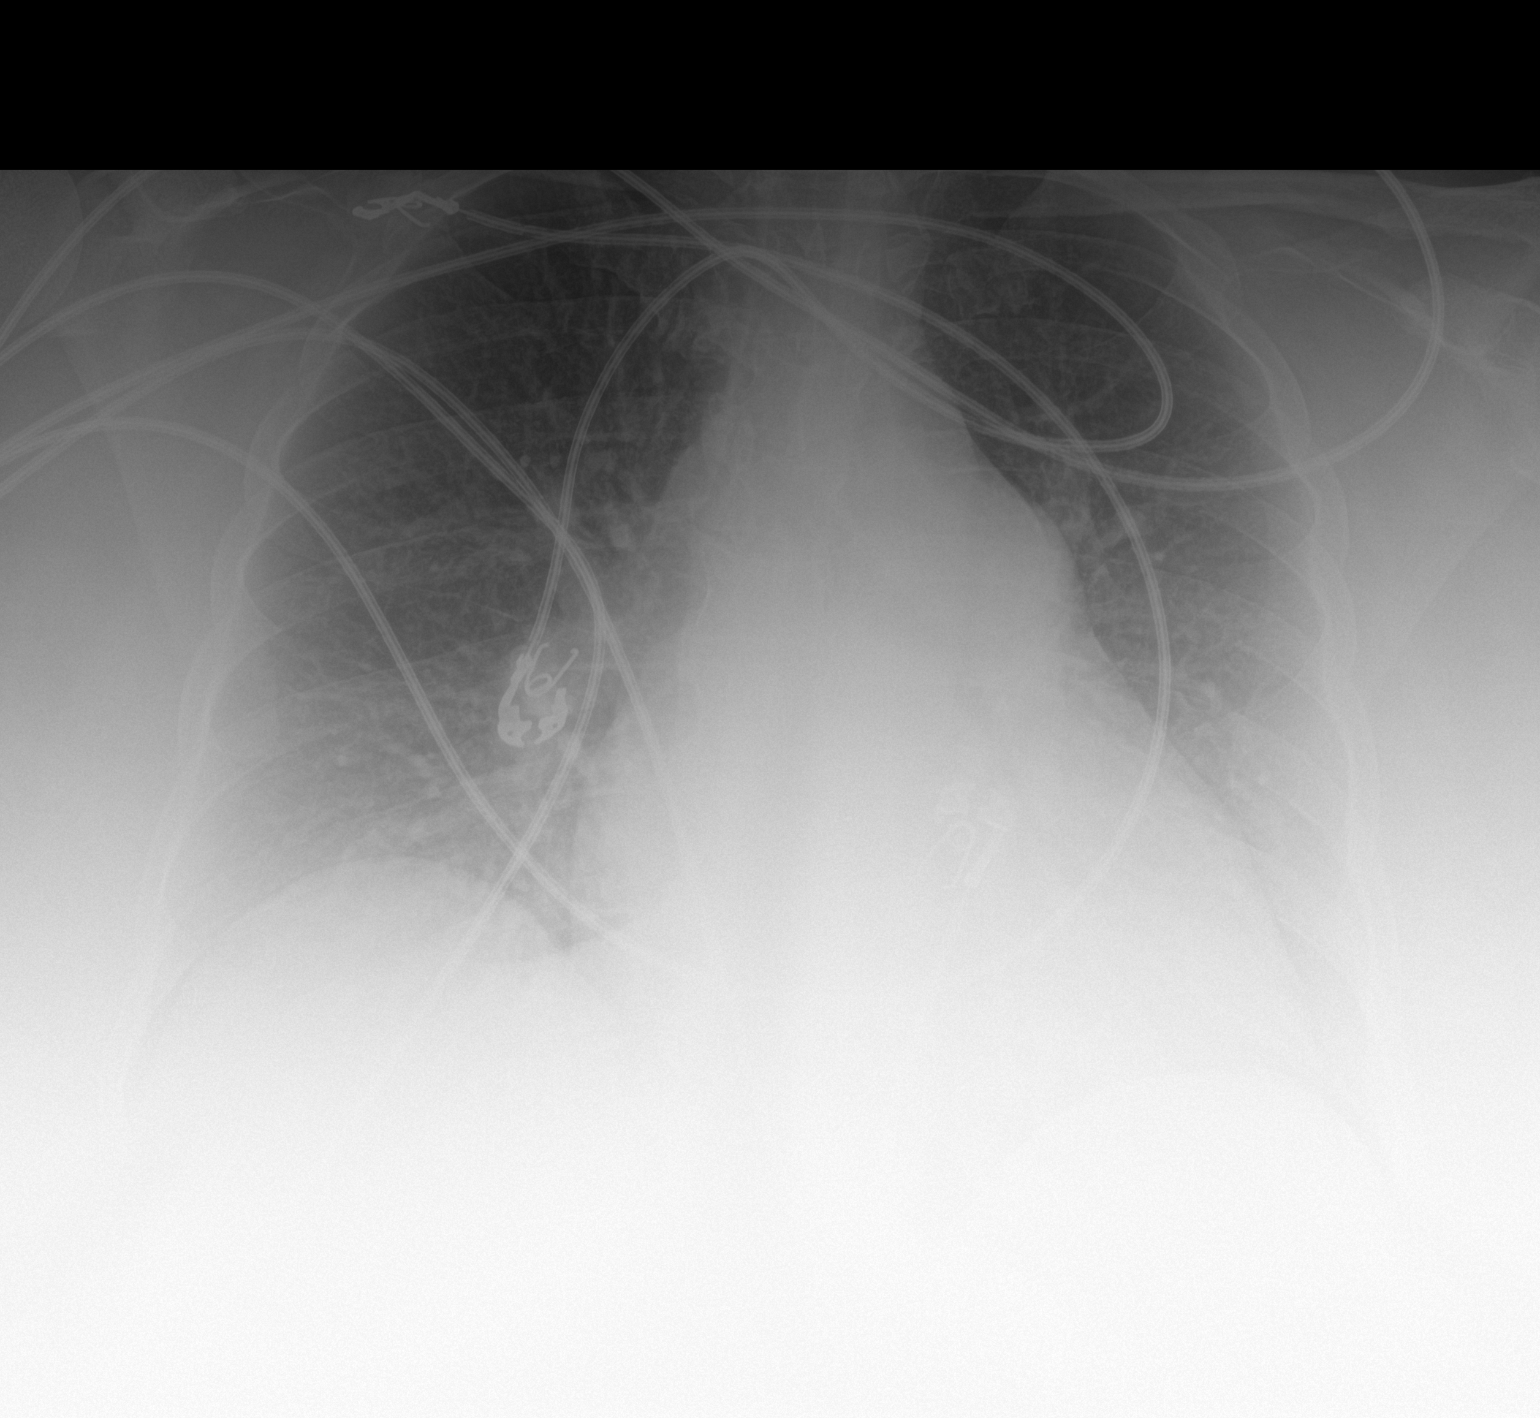

[1 of 1 positions shown; findings below may reference images not displayed]

FINDINGS: Portable AP semi upright view at 9895 hours. Chronic borderline to
mild cardiomegaly. Stable cardiac size and mediastinal contours.
Visualized tracheal air column is within normal limits. Chronic
large body habitus. Allowing for portable technique the lungs are
clear. No bowel gas visible in the upper abdomen. No acute osseous
abnormality identified.
IMPRESSION: Chronic borderline to mild cardiomegaly. No acute cardiopulmonary
abnormality.

## 2024-05-27 ENCOUNTER — Ambulatory Visit (HOSPITAL_COMMUNITY): Payer: Self-pay

## 2024-05-28 ENCOUNTER — Ambulatory Visit
Admission: RE | Admit: 2024-05-28 | Discharge: 2024-05-28 | Disposition: A | Discharge status: Home / Self Care | Payer: Self-pay | Source: Ambulatory Visit | Attending: Family Medicine | Admitting: Family Medicine

## 2024-05-28 VITALS — BP 136/84 | HR 108 | Temp 98.8°F | Resp 20

## 2024-05-28 DIAGNOSIS — E119 Type 2 diabetes mellitus without complications: Secondary | ICD-10-CM

## 2024-05-28 DIAGNOSIS — M5442 Lumbago with sciatica, left side: Secondary | ICD-10-CM | POA: Diagnosis not present

## 2024-05-28 DIAGNOSIS — M5441 Lumbago with sciatica, right side: Secondary | ICD-10-CM

## 2024-05-28 LAB — GLUCOSE, POCT (MANUAL RESULT ENTRY): POCT Glucose (KUC): 114 mg/dL — AB (ref 70–99)

## 2024-05-28 MED ORDER — PREDNISONE 20 MG PO TABS: ORAL_TABLET | ORAL | 0 refills | Status: AC

## 2024-05-28 NOTE — ED Triage Notes (Signed)
 Pt c/o lower back pain that radiates down both legs and left foot x 6 months-denies injury except for a fall when she was age 53 and has had intermittent back pain since that time-states she has been seen by PCP and has appt 10/15-states she has not been seen by ortho-she is taking naproxen and tizanidine-NAD-slow gait

## 2024-05-28 NOTE — ED Provider Notes (Signed)
 [facility]  Note:  This document was prepared using Conservation officer, historic buildings and may include unintentional dictation errors.  MRN: [id number] DOB: 1971/03/29  Subjective:   Kaylee [name] is a 53 y.o. female with pmh of sciatica, morbid obesity lumbar DDD, CHF, diabetes, lymphedema presenting for chronic back pain.  Has longstanding history of sciatica.  Back pain radiates into both legs.  Reports that the pain is severe.  Has used naproxen and tizanidine.  Does not see an orthopedist.  Last echocardiogram demonstrated EF of 70%-75%.  Reports blood sugars are well-controlled, last check was less than 100 fasting.  No current facility-administered medications for this encounter.  Current Outpatient Medications:    ACCRUFER 30 MG CAPS, Take by mouth., Disp: , Rfl:    OZEMPIC, 2 MG/DOSE, 8 MG/3ML SOPN, , Disp: , Rfl:    spironolactone (ALDACTONE) 25 MG tablet, Take 25 mg by mouth 2 (two) times daily., Disp: , Rfl:    albuterol  (VENTOLIN  HFA) 108 (90 Base) MCG/ACT inhaler, Inhale 2 puffs into the lungs every 6 (six) hours as needed for wheezing or shortness of breath., Disp: 18 g, Rfl: 0   atorvastatin  (LIPITOR) 40 MG tablet, TAKE 1 TABLET (40 MG TOTAL) BY MOUTH DAILY., Disp: 90 tablet, Rfl: 1   Blood Glucose Monitoring Suppl (TRUE METRIX METER) w/Device KIT, Use to check blood sugar TID., Disp: 1 kit, Rfl: 0   Cholecalciferol  25 MCG (1000 UT) tablet, Take 1 tablet (1,000 Units total) by mouth once daily., Disp: 90 tablet, Rfl: 3   ciprofloxacin  (CIPRO ) 500 MG tablet, SMARTSIG:1 Tablet(s) By Mouth Every 12 Hours, Disp: , Rfl:    Clindamycin-Benzoyl Per, Refr, gel, SMARTSIG:Topical Morning-Evening, Disp: , Rfl:    clonazePAM (KLONOPIN) 0.5 MG tablet, SMARTSIG:1 Tablet(s) By Mouth, Disp: , Rfl:    dapagliflozin  propanediol (FARXIGA ) 10 MG TABS tablet, Take 1 tablet (10 mg total) by mouth daily before breakfast., Disp: 30 tablet, Rfl: 1   Diclofenac  Sodium 3 %  GEL, SMARTSIG:Gram(s) Topical Twice Daily, Disp: , Rfl:    escitalopram (LEXAPRO) 10 MG tablet, Take 10 mg by mouth daily., Disp: , Rfl:    fluticasone -salmeterol (ADVAIR ) 250-50 MCG/ACT AEPB, Inhale 1 puff into the lungs 2 (two) times daily. (Patient not taking: Reported on 08/11/2023), Disp: 60 each, Rfl: 6   furosemide  (LASIX ) 20 MG tablet, TAKE 1 TABLET (20 MG TOTAL) BY MOUTH DAILY., Disp: 30 tablet, Rfl: 1   glipiZIDE  (GLUCOTROL ) 5 MG tablet, Take 1 tablet (5 mg total) by mouth 2 (two) times daily before a meal. (Patient not taking: Reported on 08/11/2023), Disp: 60 tablet, Rfl: 1   glucose blood (TRUE METRIX BLOOD GLUCOSE TEST) test strip, Use to check blood sugar 3 times daily., Disp: 100 each, Rfl: 11   losartan  (COZAAR ) 50 MG tablet, TAKE 1 TABLET (50 MG TOTAL) BY MOUTH DAILY. (Patient taking differently: Take 100 mg by mouth daily.), Disp: 9030 tablet, Rfl: 1   meloxicam  (MOBIC ) 15 MG tablet, Take 1 tablet (15 mg total) by mouth daily. (Patient not taking: Reported on 08/11/2023), Disp: 30 tablet, Rfl: 3   metoprolol  tartrate (LOPRESSOR ) 25 MG tablet, Take 3 tablets (75 mg total) by mouth 2 (two) times daily., Disp: 360 tablet, Rfl: 1   montelukast  (SINGULAIR ) 10 MG tablet, TAKE 1 TABLET BY MOUTH AT BEDTIME., Disp: 90 tablet, Rfl: 1   naproxen (NAPROSYN) 500 MG tablet, Take 500 mg by mouth 2 (two) times daily as needed., Disp: , Rfl:  ondansetron  (ZOFRAN -ODT) 4 MG disintegrating tablet, Take 1 tablet (4 mg total) by mouth every 6 (six) hours as needed. (Patient not taking: Reported on 08/11/2023), Disp: 20 tablet, Rfl: 0   OZEMPIC, 0.25 OR 0.5 MG/DOSE, 2 MG/3ML SOPN, SMARTSIG:0.25 Milligram(s) SUB-Q Once a Week, Disp: , Rfl:    potassium chloride  (KLOR-CON ) 10 MEQ tablet, Take 1 tablet (10 mEq total) by mouth daily., Disp: 90 tablet, Rfl: 0   SYMBICORT 80-4.5 MCG/ACT inhaler, Inhale 2 puffs into the lungs 2 (two) times daily., Disp: , Rfl:    tiZANidine (ZANAFLEX) 4 MG tablet, Take 4 mg by  mouth every 8 (eight) hours., Disp: , Rfl:    TRUEplus Lancets 28G MISC, Use to check blood sugar 3 times daily., Disp: 100 each, Rfl: 11   Vitamin D , Ergocalciferol , (DRISDOL ) 1.25 MG (50000 UNIT) CAPS capsule, Take 1 capsule (50,000 Units total) by mouth every 7 (seven) days. (Patient not taking: Reported on 08/11/2023), Disp: 12 capsule, Rfl: 1   No Known Allergies  Past Medical History:  Diagnosis Date   Chronic diastolic CHF (congestive heart failure) (HCC) 02/06/2022   Essential hypertension 11/30/2020   Heavy menstrual period    Hypercalcemia 02/06/2022   Hyperlipidemia 02/06/2022   Lymphedema    Morbid obesity (HCC)    Obesity, morbid, BMI 50 or higher (HCC) 11/30/2020   Sciatica    Type 2 diabetes mellitus (HCC) 11/30/2020     Past Surgical History:  Procedure Laterality Date   s/p of leep cervix     TUBAL LIGATION      Family History  Problem Relation Age of Onset   Leukemia Mother    Breast cancer Maternal Grandmother 30    Social History   Tobacco Use   Smoking status: Never   Smokeless tobacco: Never  Vaping Use   Vaping status: Never Used  Substance Use Topics   Alcohol use: Not Currently   Drug use: Not Currently    ROS   Objective:   Vitals: BP 136/84 (BP Location: Left Wrist)   Pulse (!) 108   Temp 98.8 F (37.1 C) (Oral)   Resp 20   LMP  (LMP Unknown)   SpO2 96%   Physical Exam Constitutional:      General: She is not in acute distress.    Appearance: Normal appearance. She is well-developed. She is not ill-appearing, toxic-appearing or diaphoretic.     Comments: Morbid obesity.  HENT:     Head: Normocephalic and atraumatic.     Nose: Nose normal.     Mouth/Throat:     Mouth: Mucous membranes are moist.  Eyes:     General: No scleral icterus.       Right eye: No discharge.        Left eye: No discharge.     Extraocular Movements: Extraocular movements intact.  Cardiovascular:     Rate and Rhythm: Normal rate.  Pulmonary:      Effort: Pulmonary effort is normal.  Musculoskeletal:     Lumbar back: Spasms and tenderness present. No swelling, edema, deformity, signs of trauma, lacerations or bony tenderness. Decreased range of motion. Positive right straight leg raise test and positive left straight leg raise test. No scoliosis.  Skin:    General: Skin is warm and dry.  Neurological:     General: No focal deficit present.     Mental Status: She is alert and oriented to person, place, and time.  Psychiatric:        Mood  and Affect: Mood normal.        Behavior: Behavior normal.    Results for orders placed or performed during the hospital encounter of 05/28/24 (from the past 24 hours)  POCT CBG (manual entry)     Status: Abnormal   Collection Time: 05/28/24  9:16 AM  Result Value Ref Range   POCT Glucose ([facility]) 114 (A) 70 - 99 mg/dL    Assessment and Plan :   PDMP not reviewed this encounter.  1. Acute bilateral low back pain with bilateral sciatica    Patient has chronic low back pain with bilateral sciatica. Has significant risk factors but will use prednisone  as she is failing NSAID and muscle relaxant. Emphasized need to follow up with a spine specialist. Keep appointment with her PCP. Counseled patient on potential for adverse effects with medications prescribed/recommended today, ER and return-to-clinic precautions discussed, patient verbalized understanding.    Christopher [place], [place] 05/28/24 [phone]

## 2024-06-03 ENCOUNTER — Other Ambulatory Visit (HOSPITAL_COMMUNITY): Payer: Self-pay

## 2024-06-03 DIAGNOSIS — M5416 Radiculopathy, lumbar region: Secondary | ICD-10-CM

## 2024-06-13 ENCOUNTER — Ambulatory Visit (HOSPITAL_COMMUNITY)
Admission: RE | Admit: 2024-06-13 | Discharge: 2024-06-13 | Disposition: A | Discharge status: Home / Self Care | Source: Ambulatory Visit

## 2024-06-13 DIAGNOSIS — M51369 Other intervertebral disc degeneration, lumbar region without mention of lumbar back pain or lower extremity pain: Secondary | ICD-10-CM | POA: Diagnosis not present

## 2024-06-13 DIAGNOSIS — M5416 Radiculopathy, lumbar region: Secondary | ICD-10-CM | POA: Insufficient documentation

## 2024-07-04 ENCOUNTER — Other Ambulatory Visit: Payer: Self-pay | Admitting: Physician Assistant

## 2024-07-04 DIAGNOSIS — Z1231 Encounter for screening mammogram for malignant neoplasm of breast: Secondary | ICD-10-CM

## 2024-07-08 ENCOUNTER — Ambulatory Visit

## 2024-07-09 ENCOUNTER — Ambulatory Visit

## 2024-07-21 LAB — COLOGUARD

## 2024-07-22 ENCOUNTER — Other Ambulatory Visit: Payer: Self-pay

## 2024-07-22 ENCOUNTER — Ambulatory Visit: Admitting: Physical Therapy

## 2024-07-22 ENCOUNTER — Encounter: Payer: Self-pay | Admitting: Physical Therapy

## 2024-07-22 DIAGNOSIS — R2689 Other abnormalities of gait and mobility: Secondary | ICD-10-CM | POA: Insufficient documentation

## 2024-07-22 DIAGNOSIS — M6281 Muscle weakness (generalized): Secondary | ICD-10-CM | POA: Diagnosis present

## 2024-07-22 DIAGNOSIS — M5459 Other low back pain: Secondary | ICD-10-CM | POA: Insufficient documentation
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
## 2024-07-27 LAB — COLOGUARD

## 2024-07-30 ENCOUNTER — Ambulatory Visit
Admission: RE | Admit: 2024-07-30 | Discharge: 2024-07-30 | Disposition: A | Discharge status: Home / Self Care | Source: Ambulatory Visit | Attending: Physician Assistant | Admitting: Physician Assistant

## 2024-07-30 DIAGNOSIS — Z1231 Encounter for screening mammogram for malignant neoplasm of breast: Secondary | ICD-10-CM

## 2024-08-06 ENCOUNTER — Encounter: Payer: Self-pay | Admitting: Physical Therapy

## 2024-08-06 ENCOUNTER — Ambulatory Visit: Admitting: Physical Therapy

## 2024-08-06 DIAGNOSIS — M5459 Other low back pain: Secondary | ICD-10-CM | POA: Diagnosis present

## 2024-08-06 DIAGNOSIS — M6281 Muscle weakness (generalized): Secondary | ICD-10-CM | POA: Insufficient documentation

## 2024-08-06 DIAGNOSIS — R2689 Other abnormalities of gait and mobility: Secondary | ICD-10-CM | POA: Diagnosis present
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
## 2024-08-07 LAB — COLOGUARD

## 2024-08-08 ENCOUNTER — Ambulatory Visit: Admitting: Physical Therapy

## 2024-08-12 ENCOUNTER — Ambulatory Visit: Admitting: Physical Therapy

## 2024-08-15 ENCOUNTER — Encounter: Payer: Self-pay | Admitting: Physical Therapy

## 2024-08-15 ENCOUNTER — Ambulatory Visit: Admitting: Physical Therapy

## 2024-08-15 DIAGNOSIS — R2689 Other abnormalities of gait and mobility: Secondary | ICD-10-CM

## 2024-08-15 DIAGNOSIS — M6281 Muscle weakness (generalized): Secondary | ICD-10-CM

## 2024-08-15 DIAGNOSIS — M5459 Other low back pain: Secondary | ICD-10-CM | POA: Diagnosis not present
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
## 2024-08-19 ENCOUNTER — Ambulatory Visit: Admitting: Physical Therapy

## 2024-08-22 ENCOUNTER — Encounter: Payer: Self-pay | Admitting: Physical Therapy

## 2024-08-22 ENCOUNTER — Ambulatory Visit: Admitting: Physical Therapy

## 2024-08-22 DIAGNOSIS — M5459 Other low back pain: Secondary | ICD-10-CM | POA: Diagnosis not present

## 2024-08-22 DIAGNOSIS — M6281 Muscle weakness (generalized): Secondary | ICD-10-CM
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
## 2024-08-26 ENCOUNTER — Encounter: Payer: Self-pay | Admitting: Physical Therapy

## 2024-08-26 ENCOUNTER — Ambulatory Visit: Admitting: Physical Therapy

## 2024-08-26 DIAGNOSIS — M5459 Other low back pain: Secondary | ICD-10-CM

## 2024-08-26 DIAGNOSIS — M6281 Muscle weakness (generalized): Secondary | ICD-10-CM
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
## 2024-08-28 ENCOUNTER — Ambulatory Visit: Admitting: Physical Therapy

## 2024-08-28 LAB — COLOGUARD

## 2024-09-04 ENCOUNTER — Ambulatory Visit: Admitting: Physical Therapy

## 2024-09-04 ENCOUNTER — Encounter: Payer: Self-pay | Admitting: Physical Therapy

## 2024-09-04 DIAGNOSIS — M5459 Other low back pain: Secondary | ICD-10-CM

## 2024-09-04 DIAGNOSIS — R2689 Other abnormalities of gait and mobility: Secondary | ICD-10-CM

## 2024-09-04 DIAGNOSIS — M6281 Muscle weakness (generalized): Secondary | ICD-10-CM
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
## 2024-09-09 ENCOUNTER — Ambulatory Visit: Admitting: Physical Therapy

## 2024-09-11 ENCOUNTER — Ambulatory Visit: Admitting: Physical Therapy

## 2024-09-17 ENCOUNTER — Ambulatory Visit: Admitting: Physical Therapy

## 2024-09-17 ENCOUNTER — Encounter: Payer: Self-pay | Admitting: Physical Therapy

## 2024-09-17 DIAGNOSIS — M5459 Other low back pain: Secondary | ICD-10-CM | POA: Insufficient documentation

## 2024-09-17 DIAGNOSIS — R2689 Other abnormalities of gait and mobility: Secondary | ICD-10-CM | POA: Diagnosis present

## 2024-09-17 DIAGNOSIS — M6281 Muscle weakness (generalized): Secondary | ICD-10-CM | POA: Diagnosis present
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
## 2024-09-19 ENCOUNTER — Ambulatory Visit: Admitting: Physical Therapy

## 2024-09-19 LAB — COLOGUARD

## 2024-09-24 ENCOUNTER — Encounter: Payer: Self-pay | Admitting: Physical Therapy

## 2024-09-24 ENCOUNTER — Ambulatory Visit: Admitting: Physical Therapy

## 2024-09-24 DIAGNOSIS — M6281 Muscle weakness (generalized): Secondary | ICD-10-CM

## 2024-09-24 DIAGNOSIS — M5459 Other low back pain: Secondary | ICD-10-CM | POA: Diagnosis not present

## 2024-09-24 DIAGNOSIS — R2689 Other abnormalities of gait and mobility: Secondary | ICD-10-CM
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
## 2024-10-04 LAB — COLOGUARD: COLOGUARD: NEGATIVE

## 2024-10-07 ENCOUNTER — Ambulatory Visit: Admitting: Cardiovascular Disease
